# Patient Record
Sex: Male | Born: 1995 | Hispanic: Yes | Marital: Single | Smoking: Never smoker
Health system: Southern US, Community
[De-identification: ages and names within clinical notes are randomized; demographics above are authoritative.]

## PROBLEM LIST (undated history)

## (undated) DIAGNOSIS — Z789 Other specified health status: Secondary | ICD-10-CM

---

## 2016-03-15 ENCOUNTER — Emergency Department (HOSPITAL_COMMUNITY): Payer: Medicaid Other

## 2016-03-15 ENCOUNTER — Inpatient Hospital Stay (HOSPITAL_COMMUNITY): Payer: Medicaid Other

## 2016-03-15 ENCOUNTER — Encounter (HOSPITAL_COMMUNITY): Payer: Self-pay

## 2016-03-15 ENCOUNTER — Inpatient Hospital Stay (HOSPITAL_COMMUNITY)
Admission: EM | Admit: 2016-03-15 | Discharge: 2016-04-19 | DRG: 003 | Disposition: A | Discharge status: Rehabilitation Facility | Payer: Medicaid Other | Attending: Surgery | Admitting: Surgery

## 2016-03-15 DIAGNOSIS — J9690 Respiratory failure, unspecified, unspecified whether with hypoxia or hypercapnia: Secondary | ICD-10-CM | POA: Diagnosis not present

## 2016-03-15 DIAGNOSIS — J90 Pleural effusion, not elsewhere classified: Secondary | ICD-10-CM

## 2016-03-15 DIAGNOSIS — S12601A Unspecified nondisplaced fracture of seventh cervical vertebra, initial encounter for closed fracture: Secondary | ICD-10-CM | POA: Diagnosis present

## 2016-03-15 DIAGNOSIS — S0232XA Fracture of orbital floor, left side, initial encounter for closed fracture: Secondary | ICD-10-CM | POA: Diagnosis present

## 2016-03-15 DIAGNOSIS — R402112 Coma scale, eyes open, never, at arrival to emergency department: Secondary | ICD-10-CM | POA: Diagnosis present

## 2016-03-15 DIAGNOSIS — J9811 Atelectasis: Secondary | ICD-10-CM

## 2016-03-15 DIAGNOSIS — J15211 Pneumonia due to Methicillin susceptible Staphylococcus aureus: Secondary | ICD-10-CM | POA: Diagnosis not present

## 2016-03-15 DIAGNOSIS — J9382 Other air leak: Secondary | ICD-10-CM | POA: Diagnosis not present

## 2016-03-15 DIAGNOSIS — F4323 Adjustment disorder with mixed anxiety and depressed mood: Secondary | ICD-10-CM | POA: Diagnosis not present

## 2016-03-15 DIAGNOSIS — K592 Neurogenic bowel, not elsewhere classified: Secondary | ICD-10-CM | POA: Diagnosis not present

## 2016-03-15 DIAGNOSIS — L8990 Pressure ulcer of unspecified site, unspecified stage: Secondary | ICD-10-CM | POA: Insufficient documentation

## 2016-03-15 DIAGNOSIS — S42025S Nondisplaced fracture of shaft of left clavicle, sequela: Secondary | ICD-10-CM | POA: Diagnosis not present

## 2016-03-15 DIAGNOSIS — G8220 Paraplegia, unspecified: Secondary | ICD-10-CM | POA: Diagnosis present

## 2016-03-15 DIAGNOSIS — S42022A Displaced fracture of shaft of left clavicle, initial encounter for closed fracture: Secondary | ICD-10-CM | POA: Diagnosis present

## 2016-03-15 DIAGNOSIS — E872 Acidosis: Secondary | ICD-10-CM | POA: Diagnosis not present

## 2016-03-15 DIAGNOSIS — S22069A Unspecified fracture of T7-T8 vertebra, initial encounter for closed fracture: Secondary | ICD-10-CM | POA: Diagnosis present

## 2016-03-15 DIAGNOSIS — J15212 Pneumonia due to Methicillin resistant Staphylococcus aureus: Secondary | ICD-10-CM

## 2016-03-15 DIAGNOSIS — R402342 Coma scale, best motor response, flexion withdrawal, at arrival to emergency department: Secondary | ICD-10-CM | POA: Diagnosis present

## 2016-03-15 DIAGNOSIS — M7989 Other specified soft tissue disorders: Secondary | ICD-10-CM | POA: Diagnosis not present

## 2016-03-15 DIAGNOSIS — S24109D Unspecified injury at unspecified level of thoracic spinal cord, subsequent encounter: Secondary | ICD-10-CM | POA: Diagnosis not present

## 2016-03-15 DIAGNOSIS — J939 Pneumothorax, unspecified: Secondary | ICD-10-CM

## 2016-03-15 DIAGNOSIS — A411 Sepsis due to other specified staphylococcus: Secondary | ICD-10-CM | POA: Diagnosis not present

## 2016-03-15 DIAGNOSIS — S24109A Unspecified injury at unspecified level of thoracic spinal cord, initial encounter: Secondary | ICD-10-CM

## 2016-03-15 DIAGNOSIS — S0219XA Other fracture of base of skull, initial encounter for closed fracture: Secondary | ICD-10-CM | POA: Diagnosis present

## 2016-03-15 DIAGNOSIS — S24103A Unspecified injury at T7-T10 level of thoracic spinal cord, initial encounter: Secondary | ICD-10-CM | POA: Diagnosis present

## 2016-03-15 DIAGNOSIS — S27322A Contusion of lung, bilateral, initial encounter: Secondary | ICD-10-CM | POA: Diagnosis present

## 2016-03-15 DIAGNOSIS — S069X9A Unspecified intracranial injury with loss of consciousness of unspecified duration, initial encounter: Secondary | ICD-10-CM

## 2016-03-15 DIAGNOSIS — S299XXA Unspecified injury of thorax, initial encounter: Secondary | ICD-10-CM | POA: Diagnosis not present

## 2016-03-15 DIAGNOSIS — R402222 Coma scale, best verbal response, incomprehensible words, at arrival to emergency department: Secondary | ICD-10-CM | POA: Diagnosis present

## 2016-03-15 DIAGNOSIS — S272XXA Traumatic hemopneumothorax, initial encounter: Secondary | ICD-10-CM

## 2016-03-15 DIAGNOSIS — Z9689 Presence of other specified functional implants: Secondary | ICD-10-CM

## 2016-03-15 DIAGNOSIS — Y92410 Unspecified street and highway as the place of occurrence of the external cause: Secondary | ICD-10-CM

## 2016-03-15 DIAGNOSIS — J80 Acute respiratory distress syndrome: Secondary | ICD-10-CM | POA: Diagnosis present

## 2016-03-15 DIAGNOSIS — S2221XA Fracture of manubrium, initial encounter for closed fracture: Secondary | ICD-10-CM | POA: Diagnosis present

## 2016-03-15 DIAGNOSIS — S12600A Unspecified displaced fracture of seventh cervical vertebra, initial encounter for closed fracture: Secondary | ICD-10-CM | POA: Diagnosis present

## 2016-03-15 DIAGNOSIS — S069X2S Unspecified intracranial injury with loss of consciousness of 31 minutes to 59 minutes, sequela: Secondary | ICD-10-CM | POA: Diagnosis not present

## 2016-03-15 DIAGNOSIS — J9601 Acute respiratory failure with hypoxia: Secondary | ICD-10-CM | POA: Diagnosis not present

## 2016-03-15 DIAGNOSIS — Z4682 Encounter for fitting and adjustment of non-vascular catheter: Secondary | ICD-10-CM | POA: Diagnosis not present

## 2016-03-15 DIAGNOSIS — J9600 Acute respiratory failure, unspecified whether with hypoxia or hypercapnia: Secondary | ICD-10-CM

## 2016-03-15 DIAGNOSIS — R0989 Other specified symptoms and signs involving the circulatory and respiratory systems: Secondary | ICD-10-CM

## 2016-03-15 DIAGNOSIS — I959 Hypotension, unspecified: Secondary | ICD-10-CM | POA: Diagnosis present

## 2016-03-15 DIAGNOSIS — S0181XA Laceration without foreign body of other part of head, initial encounter: Secondary | ICD-10-CM | POA: Diagnosis present

## 2016-03-15 DIAGNOSIS — S24109S Unspecified injury at unspecified level of thoracic spinal cord, sequela: Secondary | ICD-10-CM | POA: Diagnosis not present

## 2016-03-15 DIAGNOSIS — Z419 Encounter for procedure for purposes other than remedying health state, unspecified: Secondary | ICD-10-CM

## 2016-03-15 DIAGNOSIS — S069X3S Unspecified intracranial injury with loss of consciousness of 1 hour to 5 hours 59 minutes, sequela: Secondary | ICD-10-CM | POA: Diagnosis not present

## 2016-03-15 DIAGNOSIS — Z930 Tracheostomy status: Secondary | ICD-10-CM

## 2016-03-15 DIAGNOSIS — E8770 Fluid overload, unspecified: Secondary | ICD-10-CM | POA: Diagnosis present

## 2016-03-15 DIAGNOSIS — N319 Neuromuscular dysfunction of bladder, unspecified: Secondary | ICD-10-CM | POA: Diagnosis not present

## 2016-03-15 DIAGNOSIS — D62 Acute posthemorrhagic anemia: Secondary | ICD-10-CM | POA: Diagnosis present

## 2016-03-15 DIAGNOSIS — B9562 Methicillin resistant Staphylococcus aureus infection as the cause of diseases classified elsewhere: Secondary | ICD-10-CM

## 2016-03-15 DIAGNOSIS — T797XXA Traumatic subcutaneous emphysema, initial encounter: Secondary | ICD-10-CM | POA: Diagnosis present

## 2016-03-15 DIAGNOSIS — Z931 Gastrostomy status: Secondary | ICD-10-CM

## 2016-03-15 DIAGNOSIS — T1490XA Injury, unspecified, initial encounter: Secondary | ICD-10-CM | POA: Diagnosis present

## 2016-03-15 DIAGNOSIS — R52 Pain, unspecified: Secondary | ICD-10-CM | POA: Diagnosis not present

## 2016-03-15 DIAGNOSIS — R000 Tachycardia, unspecified: Secondary | ICD-10-CM

## 2016-03-15 DIAGNOSIS — N179 Acute kidney failure, unspecified: Secondary | ICD-10-CM | POA: Diagnosis present

## 2016-03-15 DIAGNOSIS — S01419A Laceration without foreign body of unspecified cheek and temporomandibular area, initial encounter: Secondary | ICD-10-CM | POA: Diagnosis present

## 2016-03-15 DIAGNOSIS — T148XXA Other injury of unspecified body region, initial encounter: Secondary | ICD-10-CM | POA: Diagnosis not present

## 2016-03-15 DIAGNOSIS — D72829 Elevated white blood cell count, unspecified: Secondary | ICD-10-CM | POA: Diagnosis not present

## 2016-03-15 DIAGNOSIS — R7881 Bacteremia: Secondary | ICD-10-CM

## 2016-03-15 DIAGNOSIS — S0993XA Unspecified injury of face, initial encounter: Secondary | ICD-10-CM

## 2016-03-15 DIAGNOSIS — Z9889 Other specified postprocedural states: Secondary | ICD-10-CM

## 2016-03-15 DIAGNOSIS — Z452 Encounter for adjustment and management of vascular access device: Secondary | ICD-10-CM

## 2016-03-15 DIAGNOSIS — S42022S Displaced fracture of shaft of left clavicle, sequela: Secondary | ICD-10-CM | POA: Diagnosis not present

## 2016-03-15 DIAGNOSIS — J942 Hemothorax: Secondary | ICD-10-CM

## 2016-03-15 DIAGNOSIS — G8918 Other acute postprocedural pain: Secondary | ICD-10-CM | POA: Diagnosis not present

## 2016-03-15 DIAGNOSIS — F1210 Cannabis abuse, uncomplicated: Secondary | ICD-10-CM

## 2016-03-15 DIAGNOSIS — R0682 Tachypnea, not elsewhere classified: Secondary | ICD-10-CM

## 2016-03-15 DIAGNOSIS — S069X0D Unspecified intracranial injury without loss of consciousness, subsequent encounter: Secondary | ICD-10-CM | POA: Diagnosis not present

## 2016-03-15 DIAGNOSIS — R1312 Dysphagia, oropharyngeal phase: Secondary | ICD-10-CM | POA: Diagnosis not present

## 2016-03-15 HISTORY — DX: Other specified health status: Z78.9

## 2016-03-15 LAB — COMPREHENSIVE METABOLIC PANEL
ALBUMIN: 4.1 g/dL (ref 3.5–5.0)
ALT: 109 U/L — AB (ref 17–63)
AST: 134 U/L — AB (ref 15–41)
Alkaline Phosphatase: 98 U/L (ref 38–126)
Anion gap: 21 — ABNORMAL HIGH (ref 5–15)
BILIRUBIN TOTAL: 0.6 mg/dL (ref 0.3–1.2)
BUN: 14 mg/dL (ref 6–20)
CO2: 14 mmol/L — ABNORMAL LOW (ref 22–32)
CREATININE: 1.5 mg/dL — AB (ref 0.61–1.24)
Calcium: 9 mg/dL (ref 8.9–10.3)
Chloride: 105 mmol/L (ref 101–111)
GFR calc Af Amer: >60 mL/min (ref >60)
GLUCOSE: 282 mg/dL — AB (ref 65–99)
Potassium: 3.2 mmol/L — ABNORMAL LOW (ref 3.5–5.1)
Sodium: 140 mmol/L (ref 135–145)
TOTAL PROTEIN: 6.4 g/dL — AB (ref 6.5–8.1)

## 2016-03-15 LAB — CBC WITH DIFFERENTIAL/PLATELET
Basophils Absolute: 0 10*3/uL (ref 0.0–0.1)
Basophils Relative: 0 %
EOS PCT: 0 %
Eosinophils Absolute: 0 10*3/uL (ref 0.0–0.7)
HEMATOCRIT: 46.5 % (ref 39.0–52.0)
HEMOGLOBIN: 15.8 g/dL (ref 13.0–17.0)
LYMPHS PCT: 41 %
Lymphs Abs: 11.8 10*3/uL — ABNORMAL HIGH (ref 0.7–4.0)
MCH: 31.5 pg (ref 26.0–34.0)
MCHC: 34 g/dL (ref 30.0–36.0)
MCV: 92.8 fL (ref 78.0–100.0)
MONOS PCT: 5 %
Monocytes Absolute: 1.4 10*3/uL — ABNORMAL HIGH (ref 0.1–1.0)
NEUTROS PCT: 54 %
Neutro Abs: 15.7 10*3/uL — ABNORMAL HIGH (ref 1.7–7.7)
Platelets: 282 10*3/uL (ref 150–400)
RBC: 5.01 MIL/uL (ref 4.22–5.81)
RDW: 12.5 % (ref 11.5–15.5)
WBC: 28.9 10*3/uL — AB (ref 4.0–10.5)

## 2016-03-15 LAB — URINALYSIS, ROUTINE W REFLEX MICROSCOPIC
BILIRUBIN URINE: NEGATIVE
Glucose, UA: 50 mg/dL — AB
Ketones, ur: NEGATIVE mg/dL
LEUKOCYTES UA: NEGATIVE
NITRITE: NEGATIVE
PH: 7 (ref 5.0–8.0)
Protein, ur: 100 mg/dL — AB
SPECIFIC GRAVITY, URINE: 1.027 (ref 1.005–1.030)
SQUAMOUS EPITHELIAL / LPF: NONE SEEN

## 2016-03-15 LAB — RAPID URINE DRUG SCREEN, HOSP PERFORMED
AMPHETAMINES: NOT DETECTED
BARBITURATES: NOT DETECTED
Benzodiazepines: POSITIVE — AB
COCAINE: NOT DETECTED
Opiates: NOT DETECTED
TETRAHYDROCANNABINOL: POSITIVE — AB

## 2016-03-15 LAB — I-STAT ARTERIAL BLOOD GAS, ED
ACID-BASE DEFICIT: 10 mmol/L — AB (ref 0.0–2.0)
Bicarbonate: 20.5 mmol/L (ref 20.0–28.0)
O2 Saturation: 87 %
PCO2 ART: 67 mmHg — AB (ref 32.0–48.0)
PH ART: 7.094 — AB (ref 7.350–7.450)
PO2 ART: 74 mmHg — AB (ref 83.0–108.0)
TCO2: 23 mmol/L (ref 0–100)

## 2016-03-15 LAB — TRIGLYCERIDES: Triglycerides: 125 mg/dL (ref <150)

## 2016-03-15 LAB — I-STAT CG4 LACTIC ACID, ED
LACTIC ACID, VENOUS: 13.76 mmol/L — AB (ref 0.5–1.9)
Lactic Acid, Venous: 4.43 mmol/L (ref 0.5–1.9)

## 2016-03-15 LAB — ETHANOL: Alcohol, Ethyl (B): <5 mg/dL (ref <5)

## 2016-03-15 LAB — ABO/RH: ABO/RH(D): O POS

## 2016-03-15 LAB — PREPARE RBC (CROSSMATCH)

## 2016-03-15 MED ORDER — PANTOPRAZOLE SODIUM 40 MG PO TBEC: 40.0000 mg | DELAYED_RELEASE_TABLET | Freq: Every day | ORAL | Status: DC

## 2016-03-15 MED ORDER — DEXTROSE-NACL 5-0.9 % IV SOLN
INTRAVENOUS | Status: DC
  Administered 2016-03-16 – 2016-03-22 (×7): via INTRAVENOUS
  Filled 2016-03-15: qty 1000

## 2016-03-15 MED ORDER — MIDAZOLAM HCL 2 MG/2ML IJ SOLN
2.0000 mg | INTRAMUSCULAR | Status: AC | PRN
  Administered 2016-03-23 (×3): 2 mg via INTRAVENOUS
  Filled 2016-03-15 (×3): qty 2

## 2016-03-15 MED ORDER — PHENYLEPHRINE HCL 10 MG/ML IJ SOLN
INTRAMUSCULAR | Status: AC | PRN
  Administered 2016-03-15: 20 ug via INTRAVENOUS

## 2016-03-15 MED ORDER — FENTANYL BOLUS VIA INFUSION
50.0000 ug | INTRAVENOUS | Status: DC | PRN
  Administered 2016-03-16 (×2): 50 ug via INTRAVENOUS
  Administered 2016-03-17: 25 ug via INTRAVENOUS
  Administered 2016-03-17 – 2016-03-22 (×6): 50 ug via INTRAVENOUS
  Administered 2016-03-22: 100 ug via INTRAVENOUS
  Administered 2016-03-23 – 2016-04-01 (×13): 50 ug via INTRAVENOUS
  Filled 2016-03-15 (×3): qty 50

## 2016-03-15 MED ORDER — ETOMIDATE 2 MG/ML IV SOLN
INTRAVENOUS | Status: DC | PRN
  Administered 2016-03-15: 30 mg via INTRAVENOUS

## 2016-03-15 MED ORDER — SODIUM CHLORIDE 0.9 % IV SOLN
25.0000 ug/h | INTRAVENOUS | Status: DC
  Administered 2016-03-16: 100 ug/h via INTRAVENOUS
  Administered 2016-03-16: 50 ug/h via INTRAVENOUS
  Administered 2016-03-17: 100 ug/h via INTRAVENOUS
  Administered 2016-03-18 – 2016-03-19 (×2): 150 ug/h via INTRAVENOUS
  Administered 2016-03-19: 200 ug/h via INTRAVENOUS
  Administered 2016-03-20: 300 ug/h via INTRAVENOUS
  Administered 2016-03-20: 250 ug/h via INTRAVENOUS
  Administered 2016-03-20: 200 ug/h via INTRAVENOUS
  Administered 2016-03-21: 400 ug/h via INTRAVENOUS
  Administered 2016-03-21: 300 ug/h via INTRAVENOUS
  Administered 2016-03-21: 350 ug/h via INTRAVENOUS
  Administered 2016-03-22 (×2): 325 ug/h via INTRAVENOUS
  Administered 2016-03-23 – 2016-03-25 (×11): 400 ug/h via INTRAVENOUS
  Administered 2016-03-25: 300 ug/h via INTRAVENOUS
  Administered 2016-03-26: 400 ug/h via INTRAVENOUS
  Filled 2016-03-15 (×28): qty 50

## 2016-03-15 MED ORDER — SODIUM CHLORIDE 0.9 % IV SOLN
INTRAVENOUS | Status: DC | PRN
  Administered 2016-03-15 (×3): 1000 mL via INTRAVENOUS

## 2016-03-15 MED ORDER — KETAMINE HCL-SODIUM CHLORIDE 100-0.9 MG/10ML-% IV SOSY
PREFILLED_SYRINGE | INTRAVENOUS | Status: AC
  Administered 2016-03-15: 100 mg
  Filled 2016-03-15: qty 10

## 2016-03-15 MED ORDER — HYDROMORPHONE HCL 2 MG/ML IJ SOLN: 1.0000 mg | INTRAMUSCULAR | Status: DC | PRN

## 2016-03-15 MED ORDER — PANTOPRAZOLE SODIUM 40 MG IV SOLR
40.0000 mg | Freq: Every day | INTRAVENOUS | Status: DC
  Administered 2016-03-16 – 2016-03-21 (×6): 40 mg via INTRAVENOUS
  Filled 2016-03-15 (×6): qty 40

## 2016-03-15 MED ORDER — ONDANSETRON HCL 4 MG PO TABS: 4.0000 mg | ORAL_TABLET | Freq: Four times a day (QID) | ORAL | Status: DC | PRN

## 2016-03-15 MED ORDER — FENTANYL CITRATE (PF) 100 MCG/2ML IJ SOLN: 50.0000 ug | Freq: Once | INTRAMUSCULAR | Status: DC

## 2016-03-15 MED ORDER — IOPAMIDOL (ISOVUE-300) INJECTION 61%
INTRAVENOUS | Status: AC
  Administered 2016-03-15: 100 mL
  Filled 2016-03-15: qty 100

## 2016-03-15 MED ORDER — SODIUM CHLORIDE 0.9 % IV SOLN
0.5000 mg/kg/h | INTRAVENOUS | Status: DC
  Administered 2016-03-15: 0.5 mg/kg/h via INTRAVENOUS
  Filled 2016-03-15: qty 2

## 2016-03-15 MED ORDER — PHENYLEPHRINE HCL 10 MG/ML IJ SOLN
0.0000 ug/min | INTRAVENOUS | Status: DC
  Administered 2016-03-15: 100 ug/min via INTRAVENOUS
  Administered 2016-03-16: 200 ug/min via INTRAVENOUS
  Filled 2016-03-15 (×3): qty 1

## 2016-03-15 MED ORDER — PROPOFOL 1000 MG/100ML IV EMUL
5.0000 ug/kg/min | INTRAVENOUS | Status: DC
Start: 2016-03-15 — End: 2016-03-15
  Administered 2016-03-16: 50 ug/kg/min via INTRAVENOUS

## 2016-03-15 MED ORDER — ONDANSETRON HCL 4 MG/2ML IJ SOLN: 4.0000 mg | Freq: Four times a day (QID) | INTRAMUSCULAR | Status: DC | PRN

## 2016-03-15 MED ORDER — MIDAZOLAM HCL 5 MG/5ML IJ SOLN
INTRAMUSCULAR | Status: DC | PRN
  Administered 2016-03-15: 2 mg via INTRAVENOUS

## 2016-03-15 MED ORDER — ORAL CARE MOUTH RINSE
15.0000 mL | Freq: Four times a day (QID) | OROMUCOSAL | Status: DC
  Administered 2016-03-16 (×3): 15 mL via OROMUCOSAL

## 2016-03-15 MED ORDER — PROPOFOL 500 MG/50ML IV EMUL
INTRAVENOUS | Status: DC | PRN
  Administered 2016-03-15: 20 mg/kg/h via INTRAVENOUS
  Administered 2016-03-15: 20 ug/kg/min via INTRAVENOUS

## 2016-03-15 MED ORDER — LORAZEPAM 2 MG/ML IJ SOLN
INTRAMUSCULAR | Status: AC
  Filled 2016-03-15: qty 1

## 2016-03-15 MED ORDER — SODIUM CHLORIDE 0.9 % IV SOLN
INTRAVENOUS | Status: AC | PRN
  Administered 2016-03-15 (×2): 1000 mL via INTRAVENOUS

## 2016-03-15 MED ORDER — ROCURONIUM BROMIDE 50 MG/5ML IV SOLN
INTRAVENOUS | Status: DC | PRN
  Administered 2016-03-15: 50 mg via INTRAVENOUS

## 2016-03-15 MED ORDER — MIDAZOLAM HCL 2 MG/2ML IJ SOLN
INTRAMUSCULAR | Status: AC
  Filled 2016-03-15: qty 4

## 2016-03-15 MED ORDER — SUCCINYLCHOLINE CHLORIDE 20 MG/ML IJ SOLN
INTRAMUSCULAR | Status: DC | PRN
  Administered 2016-03-15: 90 mg via INTRAVENOUS

## 2016-03-15 MED ORDER — CHLORHEXIDINE GLUCONATE 0.12% ORAL RINSE (MEDLINE KIT)
15.0000 mL | Freq: Two times a day (BID) | OROMUCOSAL | Status: DC
  Administered 2016-03-16 (×2): 15 mL via OROMUCOSAL

## 2016-03-15 MED ORDER — MIDAZOLAM HCL 2 MG/2ML IJ SOLN
2.0000 mg | INTRAMUSCULAR | Status: DC | PRN
  Administered 2016-03-22 – 2016-03-23 (×3): 2 mg via INTRAVENOUS
  Filled 2016-03-15 (×3): qty 2

## 2016-03-15 MED ORDER — PROPOFOL 1000 MG/100ML IV EMUL
INTRAVENOUS | Status: AC
  Administered 2016-03-15: 20 mg/kg/h via INTRAVENOUS
  Filled 2016-03-15: qty 100

## 2016-03-15 MED ORDER — SODIUM CHLORIDE 0.9 % IV SOLN: Freq: Once | INTRAVENOUS | Status: DC

## 2016-03-15 NOTE — ED Notes (Signed)
ER MD at bedside for chest tube insertion on the L side

## 2016-03-15 NOTE — ED Notes (Signed)
Off to CT

## 2016-03-15 NOTE — ED Notes (Signed)
Neo Drip increased to 200

## 2016-03-15 NOTE — ED Notes (Signed)
Labored breathing noted

## 2016-03-15 NOTE — ED Notes (Signed)
+   pedal pulses felt pt has laceration noted to R facial area

## 2016-03-15 NOTE — ED Notes (Signed)
Chest tube inserted on the R chest

## 2016-03-15 NOTE — ED Notes (Signed)
[facility]

## 2016-03-15 NOTE — Procedures (Signed)
Central Venous Catheter Insertion Procedure Note Luis [name] [id number] 29-Feb-1996  Procedure: Insertion of Central Venous Catheter Indications: Drug and/or fluid administration  Procedure Details Consent: Unable to obtain consent because of emergent medical necessity. Time Out: Verified patient identification, verified procedure, site/side was marked, verified correct patient position, special equipment/implants available, medications/allergies/relevent history reviewed, required imaging and test results available.  Performed  Maximum sterile technique was used including antiseptics, gloves, hand hygiene and sheet. Skin prep: Iodine solution; local anesthetic administered A antimicrobial bonded/coated triple lumen catheter was placed in the right femoral vein due to emergent situation using the Seldinger technique.  Evaluation Blood flow good Complications: No apparent complications Patient did tolerate procedure well. Chest X-ray ordered to verify placement.  CXR: not done due to femoral placement .  Luis [name] A. 03/15/2016, 11:02 PM

## 2016-03-15 NOTE — ED Notes (Signed)
Large bruise noted to mid thoracic area

## 2016-03-15 NOTE — ED Notes (Signed)
Neo push going in now

## 2016-03-15 NOTE — ED Notes (Signed)
ENT at bedside for facial laceration repair Trauma Surgeon at bedside for 3rd chest tube insertion on the R side
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
## 2016-03-15 NOTE — ED Notes (Signed)
1st unit Blood finished
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
## 2016-03-15 NOTE — ED Notes (Signed)
Second bag IVF wide open
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
## 2016-03-15 NOTE — ED Notes (Signed)
Pt intubated 7.5 ETT 20 cm at the lip + color change + breath sounds heard

## 2016-03-15 NOTE — ED Notes (Signed)
Trauma MD remains at bedside Chest tubes hooked up to suction
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
## 2016-03-15 NOTE — ED Notes (Signed)
Pt continues to pull upper extremities increased Propofol to 40

## 2016-03-15 NOTE — ED Notes (Signed)
2nd Unit blood complete

## 2016-03-15 NOTE — Procedures (Signed)
Chest Tube Insertion Procedure Note  Indications:  Clinically significant Pneumothorax bilateral  Pre-operative Diagnosis: Pneumothorax bilateral  Post-operative Diagnosis: Pneumothorax  bilateral  Procedure Details  Emergency consent obtained due to patient's critical condition..  Risks of lung perforation, hemorrhage, arrhythmia, and adverse drug reaction were discussed.   After sterile skin prep, using standard technique, a 20 French tube was placed in the right anterior 5 rib space A similar 20 French chest tube was placed on the left side anterior fifth rib space above previously placed chest tubes. Both tubes had a return of blood and air. There secured to skin with 2-0 silk..  Findings: None  Estimated Blood Loss:  less than 50 mL         Specimens:  None              Complications:  None; patient tolerated the procedure well.         Disposition: ICU - intubated and critically ill.         Condition: stable  Attending Attestation: I performed the procedure.

## 2016-03-15 NOTE — ED Notes (Addendum)
L upper needle decompression 16G placed by EMS for L chest

## 2016-03-15 NOTE — ED Notes (Signed)
Pt back from CT ER MD to bedside to evaluate L chest tube as no bubbles are noted in the chamber chest tube is hooked up to suction appropriately pt remains on monitor no change in condition

## 2016-03-15 NOTE — ED Notes (Signed)
Pt continues to move his R arm propofol increased to 60 mcg/kg/
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
## 2016-03-15 NOTE — Procedures (Signed)
Preop diagnosis: Left facial lacerations Postop diagnosis: same Procedure: Moderate complexity closure of left facial lacerations totaling 16 cm Surgeon: Jenne PaneBates Anesth: Local with 2% lidocaine with 1:200,000 epinephrine Comp: None Findings:  There is a medial brow laceration that is vertically oriented and extends through muscle but not down to periosteum.  There is a medial cheek laceration fully through skin with a laterally pedicled skin flap.  Likewise, there is an upper lateral cheek laceration with a laterally pedicled skin flap.  Finally, there is a lower cheek laceration that extends superior laterally that is through skin. Description:  The patient was identified in the ER.  The patient was unable to give consent due to being intubated and there was no family available at the time.  The left face was prepped with Betadyne and draped.  The wounds were injected with local anesthetic and then copiously irrigated with saline, removing clot and foreign material.  The wounds were then each closed in the subcutaneous tissue using 4-0 Vicryl in a simple, interrupted fashion.  The skin level was then closed with 5-0 Prolene in a simple, running and simple, interrupted fashion.  The wounds were covered with Bactracin and he was returned to nursing care.  Throughout the procedure, the patient's sedation level and low blood pressure were managed by the Trauma and ER staff.

## 2016-03-15 NOTE — ED Notes (Signed)
Trauma surgeon notified blood noted to be pouring out around L lower chest tube
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
## 2016-03-15 NOTE — ED Notes (Signed)
Chest tube inserted on the L side

## 2016-03-15 NOTE — ED Notes (Signed)
Propofol increased to 40mcg/kg
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
## 2016-03-15 NOTE — ED Notes (Signed)
Pt continues to pull and use upper extremities propofol at 50
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
## 2016-03-15 NOTE — ED Notes (Signed)
Pt continues to pull and use upper extremities RN and CNA helping to prevent pt from pulling at lines increased propofol to 60

## 2016-03-15 NOTE — ED Notes (Signed)
[facility], pt continues to move upper extremities page put out to Trauma Surgeon for additional sedation orders

## 2016-03-15 NOTE — ED Notes (Signed)
Pt moving bilateral upper extremities Trauma Surgeon aware continuing to hold on propofol as pressures are 60's systolic

## 2016-03-15 NOTE — ED Notes (Signed)
Propofol increased to 30mcg/kg

## 2016-03-15 NOTE — Progress Notes (Signed)
   03/15/16 2240  Clinical Encounter Type  Visited With Family  Visit Type ED;Follow-up  Spiritual Encounters  Spiritual Needs Emotional  Stress Factors  Family Stress Factors Family relationships  Family in Pod A as other areas full. Physician came in to update family. Pt to go to MRI then to 7830m. Escorted family to third floor waiting room.

## 2016-03-15 NOTE — ED Notes (Signed)
Propofol increased to 7650mcg/kg/hr due to pt. Moving R arm

## 2016-03-15 NOTE — ED Notes (Signed)
Pt continues to move upper extremities Bolus 20 mg of Propofol given followed by infusion at 4220mcg/kg

## 2016-03-15 NOTE — ED Notes (Signed)
Dr Hyacinth MeekerMiller spoke with pt's mother instructed her to come in

## 2016-03-15 NOTE — ED Notes (Signed)
Trauma Surgeon to bedside bilateral lower chest tubes removed from pt.

## 2016-03-15 NOTE — ED Triage Notes (Signed)
Rollover with + ejection

## 2016-03-15 NOTE — ED Notes (Addendum)
Trauma Surgeon at bedside L  Chest tube inserted and hooked up to intermittent suction

## 2016-03-16 ENCOUNTER — Inpatient Hospital Stay (HOSPITAL_COMMUNITY): Payer: Medicaid Other

## 2016-03-16 ENCOUNTER — Other Ambulatory Visit (HOSPITAL_COMMUNITY): Payer: Self-pay

## 2016-03-16 LAB — PREPARE FRESH FROZEN PLASMA
BLOOD PRODUCT EXPIRATION DATE: 201712222359
Blood Product Expiration Date: 201712222359
ISSUE DATE / TIME: 201712191935
ISSUE DATE / TIME: 201712191935
Unit Type and Rh: 6200
Unit Type and Rh: 6200

## 2016-03-16 LAB — POCT I-STAT 3, ART BLOOD GAS (G3+)
Acid-base deficit: 5 mmol/L — ABNORMAL HIGH (ref 0.0–2.0)
BICARBONATE: 19.1 mmol/L — AB (ref 20.0–28.0)
O2 SAT: 99 %
TCO2: 20 mmol/L (ref 0–100)
pCO2 arterial: 31.5 mmHg — ABNORMAL LOW (ref 32.0–48.0)
pH, Arterial: 7.391 (ref 7.350–7.450)
pO2, Arterial: 159 mmHg — ABNORMAL HIGH (ref 83.0–108.0)

## 2016-03-16 LAB — COMPREHENSIVE METABOLIC PANEL
ALK PHOS: 52 U/L (ref 38–126)
ALT: 71 U/L — AB (ref 17–63)
AST: 136 U/L — AB (ref 15–41)
Albumin: 2.2 g/dL — ABNORMAL LOW (ref 3.5–5.0)
Anion gap: 3 — ABNORMAL LOW (ref 5–15)
BILIRUBIN TOTAL: 1 mg/dL (ref 0.3–1.2)
BUN: 11 mg/dL (ref 6–20)
CALCIUM: 6.8 mg/dL — AB (ref 8.9–10.3)
CHLORIDE: 116 mmol/L — AB (ref 101–111)
CO2: 20 mmol/L — ABNORMAL LOW (ref 22–32)
CREATININE: 1.08 mg/dL (ref 0.61–1.24)
Glucose, Bld: 186 mg/dL — ABNORMAL HIGH (ref 65–99)
Potassium: 4.6 mmol/L (ref 3.5–5.1)
Sodium: 139 mmol/L (ref 135–145)
TOTAL PROTEIN: 3.5 g/dL — AB (ref 6.5–8.1)

## 2016-03-16 LAB — CBC
HEMATOCRIT: 30 % — AB (ref 39.0–52.0)
HEMOGLOBIN: 10.5 g/dL — AB (ref 13.0–17.0)
MCH: 31.1 pg (ref 26.0–34.0)
MCHC: 35 g/dL (ref 30.0–36.0)
MCV: 88.8 fL (ref 78.0–100.0)
Platelets: 170 10*3/uL (ref 150–400)
RBC: 3.38 MIL/uL — AB (ref 4.22–5.81)
RDW: 14.1 % (ref 11.5–15.5)
WBC: 10.9 10*3/uL — AB (ref 4.0–10.5)

## 2016-03-16 LAB — HEMOGLOBIN AND HEMATOCRIT, BLOOD
HCT: 26.3 % — ABNORMAL LOW (ref 39.0–52.0)
Hemoglobin: 9.3 g/dL — ABNORMAL LOW (ref 13.0–17.0)

## 2016-03-16 LAB — BLOOD PRODUCT ORDER (VERBAL) VERIFICATION

## 2016-03-16 LAB — PREPARE RBC (CROSSMATCH)

## 2016-03-16 LAB — LACTIC ACID, PLASMA: Lactic Acid, Venous: 2.1 mmol/L (ref 0.5–1.9)

## 2016-03-16 MED ORDER — PROPOFOL 1000 MG/100ML IV EMUL
INTRAVENOUS | Status: AC
Start: 2016-03-16 — End: 2016-03-16
  Filled 2016-03-16: qty 100

## 2016-03-16 MED ORDER — SODIUM CHLORIDE 0.9 % IV SOLN
Freq: Once | INTRAVENOUS | Status: AC
  Administered 2016-03-16: 06:00:00 via INTRAVENOUS

## 2016-03-16 MED ORDER — SODIUM CHLORIDE 0.9 % IV SOLN
Freq: Once | INTRAVENOUS | Status: AC
  Administered 2016-03-16: 03:00:00 via INTRAVENOUS

## 2016-03-16 MED ORDER — LIDOCAINE HCL (PF) 1 % IJ SOLN
INTRAMUSCULAR | Status: AC
  Administered 2016-03-16: 10 mL
  Filled 2016-03-16: qty 10

## 2016-03-16 MED ORDER — MIDAZOLAM HCL 5 MG/ML IJ SOLN
1.0000 mg/h | INTRAMUSCULAR | Status: DC
  Administered 2016-03-16 (×2): 2 mg/h via INTRAVENOUS
  Administered 2016-03-16: 3 mg/h via INTRAVENOUS
  Administered 2016-03-17 – 2016-03-19 (×3): 2 mg/h via INTRAVENOUS
  Administered 2016-03-20: 5 mg/h via INTRAVENOUS
  Filled 2016-03-16 (×7): qty 10

## 2016-03-16 MED ORDER — ORAL CARE MOUTH RINSE
15.0000 mL | OROMUCOSAL | Status: DC
  Administered 2016-03-16 – 2016-04-10 (×225): 15 mL via OROMUCOSAL

## 2016-03-16 MED ORDER — CHLORHEXIDINE GLUCONATE 0.12% ORAL RINSE (MEDLINE KIT)
15.0000 mL | Freq: Two times a day (BID) | OROMUCOSAL | Status: DC
  Administered 2016-03-16 – 2016-04-10 (×48): 15 mL via OROMUCOSAL

## 2016-03-16 MED ORDER — NOREPINEPHRINE BITARTRATE 1 MG/ML IV SOLN
0.0000 ug/min | INTRAVENOUS | Status: DC
  Administered 2016-03-16: 7 ug/min via INTRAVENOUS
  Filled 2016-03-16 (×2): qty 4

## 2016-03-16 MED ORDER — PHENYLEPHRINE HCL 10 MG/ML IJ SOLN
0.0000 ug/min | INTRAMUSCULAR | Status: DC
  Administered 2016-03-16: 175 ug/min via INTRAVENOUS
  Administered 2016-03-16: 50 ug/min via INTRAVENOUS
  Administered 2016-03-16: 200 ug/min via INTRAVENOUS
  Filled 2016-03-16 (×4): qty 4

## 2016-03-16 NOTE — Progress Notes (Signed)
RT note-Attempted to place Aline in L and R radial, good flow and unable to thread catheter. RN and MD aware.

## 2016-03-16 NOTE — Procedures (Signed)
Chest Tube Insertion Procedure Note  Indications:  Clinically significant Pneumothorax and continuous air leak with chest tube in place  Pre-operative Diagnosis: Left pneumothorax with continuoous air leak  Post-operative Diagnosis: Pneumothorax and and continuous air leak  Procedure Details  Informed consent was obtained for the procedure, including sedation.  Risks of lung perforation, hemorrhage, arrhythmia, and adverse drug reaction were discussed.   After sterile skin prep, using standard technique, a 28 French tube was placed in the left anterior 6th rib space.  Findings: None  Estimated Blood Loss:  Minimal         Specimens:  None              Complications:  None; patient tolerated the procedure well.         Disposition: ICU - intubated and critically ill.         Condition: stable  Attending Attestation: I performed the procedure.

## 2016-03-16 NOTE — Procedures (Signed)
Central Venous Catheter Insertion Procedure Note Luis [name] [id number] [birth year]  Procedure: Insertion of Central Venous Catheter Indications: Assessment of intravascular volume and Drug and/or fluid administration  Procedure Details Consent: Risks of procedure as well as the alternatives and risks of each were explained to the (patient/caregiver).  Consent for procedure obtained. Time Out: Verified patient identification, verified procedure, site/side was marked, verified correct patient position, special equipment/implants available, medications/allergies/relevent history reviewed, required imaging and test results available.  Performed  Maximum sterile technique was used including antiseptics, cap, gloves, gown, hand hygiene, mask and sheet. Skin prep: Chlorhexidine; local anesthetic administered A antimicrobial bonded/coated triple lumen catheter was placed in the right subclavian vein using the Seldinger technique.  Evaluation Blood flow good Complications: No apparent complications Patient did tolerate procedure well. Chest X-ray ordered to verify placement.  CXR: pending.  Luis [name] 03/16/2016, 6:10 PM

## 2016-03-16 NOTE — ED Notes (Signed)
[facility]  Waiting to go to MRI
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
## 2016-03-16 NOTE — Procedures (Signed)
Arterial Catheter Insertion Procedure Note Luis [name] [id number] March 12, 1996  Procedure: Insertion of Arterial Catheter  Indications: Blood pressure monitoring  Procedure Details Consent: Risks of procedure as well as the alternatives and risks of each were explained to the (patient/caregiver).  Consent for procedure obtained. Time Out: Verified patient identification, verified procedure, site/side was marked, verified correct patient position, special equipment/implants available, medications/allergies/relevent history reviewed, required imaging and test results available.  Performed  Maximum sterile technique was used including gloves and hand hygiene. Skin prep: Alcohol; local anesthetic administered 20 gauge catheter was inserted into right radial artery using the Seldinger technique.  Evaluation Blood flow good; BP tracing good. Complications: No apparent complications.   Luis [name] 03/16/2016

## 2016-03-17 ENCOUNTER — Inpatient Hospital Stay (HOSPITAL_COMMUNITY): Payer: Medicaid Other

## 2016-03-17 LAB — GLUCOSE, CAPILLARY
GLUCOSE-CAPILLARY: 107 mg/dL — AB (ref 65–99)
GLUCOSE-CAPILLARY: 113 mg/dL — AB (ref 65–99)
GLUCOSE-CAPILLARY: 144 mg/dL — AB (ref 65–99)

## 2016-03-17 LAB — BASIC METABOLIC PANEL
Anion gap: 4 — ABNORMAL LOW (ref 5–15)
BUN: 9 mg/dL (ref 6–20)
CALCIUM: 7.6 mg/dL — AB (ref 8.9–10.3)
CHLORIDE: 113 mmol/L — AB (ref 101–111)
CO2: 19 mmol/L — AB (ref 22–32)
CREATININE: 0.81 mg/dL (ref 0.61–1.24)
GFR calc Af Amer: >60 mL/min (ref >60)
GFR calc non Af Amer: >60 mL/min (ref >60)
GLUCOSE: 132 mg/dL — AB (ref 65–99)
Potassium: 3.5 mmol/L (ref 3.5–5.1)
Sodium: 136 mmol/L (ref 135–145)

## 2016-03-17 LAB — CBC WITH DIFFERENTIAL/PLATELET
Basophils Absolute: 0 10*3/uL (ref 0.0–0.1)
Basophils Relative: 0 %
Eosinophils Absolute: 0 10*3/uL (ref 0.0–0.7)
Eosinophils Relative: 0 %
HEMATOCRIT: 26.5 % — AB (ref 39.0–52.0)
HEMOGLOBIN: 9.5 g/dL — AB (ref 13.0–17.0)
LYMPHS ABS: 1.3 10*3/uL (ref 0.7–4.0)
LYMPHS PCT: 16 %
MCH: 30 pg (ref 26.0–34.0)
MCHC: 35.8 g/dL (ref 30.0–36.0)
MCV: 83.6 fL (ref 78.0–100.0)
MONO ABS: 0.7 10*3/uL (ref 0.1–1.0)
MONOS PCT: 8 %
NEUTROS ABS: 6.3 10*3/uL (ref 1.7–7.7)
NEUTROS PCT: 76 %
Platelets: 98 10*3/uL — ABNORMAL LOW (ref 150–400)
RBC: 3.17 MIL/uL — ABNORMAL LOW (ref 4.22–5.81)
RDW: 14 % (ref 11.5–15.5)
WBC: 8.3 10*3/uL (ref 4.0–10.5)

## 2016-03-17 LAB — PHOSPHORUS
PHOSPHORUS: 1.6 mg/dL — AB (ref 2.5–4.6)
Phosphorus: 1.4 mg/dL — ABNORMAL LOW (ref 2.5–4.6)

## 2016-03-17 LAB — MAGNESIUM
Magnesium: 1.6 mg/dL — ABNORMAL LOW (ref 1.7–2.4)
Magnesium: 1.7 mg/dL (ref 1.7–2.4)

## 2016-03-17 LAB — LACTIC ACID, PLASMA
LACTIC ACID, VENOUS: 1.2 mmol/L (ref 0.5–1.9)
LACTIC ACID, VENOUS: 0.9 mmol/L (ref 0.5–1.9)
Lactic Acid, Venous: 1.4 mmol/L (ref 0.5–1.9)

## 2016-03-17 MED ORDER — CLONAZEPAM 0.5 MG PO TABS
0.5000 mg | ORAL_TABLET | Freq: Two times a day (BID) | ORAL | Status: DC
  Administered 2016-03-17 – 2016-03-23 (×14): 0.5 mg
  Filled 2016-03-17 (×14): qty 1

## 2016-03-17 MED ORDER — ALBUMIN HUMAN 5 % IV SOLN
12.5000 g | Freq: Once | INTRAVENOUS | Status: AC
  Administered 2016-03-17: 12.5 g via INTRAVENOUS

## 2016-03-17 MED ORDER — ACETAMINOPHEN 160 MG/5ML PO SOLN
650.0000 mg | Freq: Four times a day (QID) | ORAL | Status: DC | PRN
  Administered 2016-03-17 – 2016-03-20 (×8): 650 mg
  Administered 2016-03-21: 14:00:00
  Administered 2016-03-22 – 2016-04-03 (×21): 650 mg
  Filled 2016-03-17 (×30): qty 20.3

## 2016-03-17 MED ORDER — CLONAZEPAM 0.1 MG/ML ORAL SUSPENSION: 0.5000 mg | Freq: Two times a day (BID) | ORAL | Status: DC

## 2016-03-17 MED ORDER — VITAL HIGH PROTEIN PO LIQD: 1000.0000 mL | ORAL | Status: DC

## 2016-03-17 MED ORDER — SODIUM CHLORIDE 0.9 % IV BOLUS (SEPSIS)
1000.0000 mL | Freq: Once | INTRAVENOUS | Status: AC
  Administered 2016-03-17: 1000 mL via INTRAVENOUS

## 2016-03-17 MED ORDER — PRO-STAT SUGAR FREE PO LIQD
30.0000 mL | Freq: Two times a day (BID) | ORAL | Status: DC
  Administered 2016-03-17: 30 mL
  Filled 2016-03-17: qty 30

## 2016-03-17 MED ORDER — PIVOT 1.5 CAL PO LIQD: 1000.0000 mL | ORAL | Status: DC

## 2016-03-17 MED ORDER — ALBUMIN HUMAN 5 % IV SOLN
25.0000 g | Freq: Once | INTRAVENOUS | Status: AC
  Administered 2016-03-17: 25 g via INTRAVENOUS
  Filled 2016-03-17: qty 500

## 2016-03-17 MED ORDER — METOPROLOL TARTRATE 5 MG/5ML IV SOLN
5.0000 mg | INTRAVENOUS | Status: DC
  Administered 2016-03-18 – 2016-03-19 (×9): 5 mg via INTRAVENOUS
  Filled 2016-03-17 (×9): qty 5

## 2016-03-17 MED ORDER — SELENIUM 50 MCG PO TABS
200.0000 ug | ORAL_TABLET | Freq: Every day | ORAL | Status: AC
  Administered 2016-03-17 – 2016-03-23 (×7): 200 ug
  Filled 2016-03-17 (×7): qty 4

## 2016-03-17 MED ORDER — INFLUENZA VAC SPLIT QUAD 0.5 ML IM SUSY
0.5000 mL | PREFILLED_SYRINGE | INTRAMUSCULAR | Status: AC
  Administered 2016-03-18: 0.5 mL via INTRAMUSCULAR
  Filled 2016-03-17: qty 0.5

## 2016-03-17 MED ORDER — ALBUMIN HUMAN 5 % IV SOLN
12.5000 g | Freq: Once | INTRAVENOUS | Status: AC
  Administered 2016-03-18: 12.5 g via INTRAVENOUS
  Filled 2016-03-17: qty 250

## 2016-03-17 MED ORDER — PIVOT 1.5 CAL PO LIQD
1000.0000 mL | ORAL | Status: DC
  Administered 2016-03-17 – 2016-03-24 (×8): 1000 mL

## 2016-03-17 MED ORDER — ALBUMIN HUMAN 5 % IV SOLN
INTRAVENOUS | Status: AC
  Administered 2016-03-17: 12.5 g via INTRAVENOUS
  Filled 2016-03-17: qty 250

## 2016-03-17 MED ORDER — MAGNESIUM SULFATE IN D5W 1-5 GM/100ML-% IV SOLN
1.0000 g | Freq: Once | INTRAVENOUS | Status: AC
  Administered 2016-03-17: 1 g via INTRAVENOUS
  Filled 2016-03-17: qty 100

## 2016-03-17 MED ORDER — VITAMIN C 500 MG PO TABS
1000.0000 mg | ORAL_TABLET | Freq: Three times a day (TID) | ORAL | Status: AC
  Administered 2016-03-17 – 2016-03-23 (×21): 1000 mg
  Filled 2016-03-17 (×21): qty 2

## 2016-03-17 NOTE — Progress Notes (Signed)
   Subjective:    Patient ID: Casimiro NeedleArturo Arechiga Marques, male    DOB: Nov 22, 1995, 20 y.o.   MRN: [id number]  HPI  Weaning from pressors.  Remains intubated.  MRI of back pending to determine potential surgery.    Review of Systems  Unable to perform ROS: Intubated       Objective:   Physical Exam  Constitutional: He appears well-developed and well-nourished.  HENT:  Left facial lacerations healing well with sutures in place.  General left facial edema.  Superior medial orbital rim with palpable stepoff.          Assessment & Plan:  Facial lacerations, depressed left anterior table frontal sinus fracture, non-displaced posterior table frontal sinus fracture, mildly displaced left orbital floor fracture  Facial wounds appear to be healing well.  Sutures will be removed around 7 days.  Left frontal sinus fracture will require repair.  Plan to use medial brow laceration for approach.  Need to wait for facial edema to improve, pressors to be weaned, and back to be stabilized, if needed.  Left orbital floor fracture with mild orbital fat herniation.  Does not appear to be affecting orbital volume much.  Probably does not need repair.
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
## 2016-03-17 NOTE — Progress Notes (Signed)
RT note-elevated temp, HR, RR, with a decrease in sp02,, increased fio2 and peep, MD aware, continue to monitor.
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
## 2016-03-17 NOTE — Progress Notes (Addendum)
Patient's temperature elevated. Sp02 low 90s, HR 130s-140. Orders received from MD. Will continue to monitor.

## 2016-03-17 NOTE — Progress Notes (Addendum)
Patient not stable enough for MRI at this time. Will reevaluate in the morning per MD. Temp is improving. Will continue to monitor.

## 2016-03-18 ENCOUNTER — Inpatient Hospital Stay (HOSPITAL_COMMUNITY): Payer: Medicaid Other

## 2016-03-18 LAB — GLUCOSE, CAPILLARY
GLUCOSE-CAPILLARY: 109 mg/dL — AB (ref 65–99)
GLUCOSE-CAPILLARY: 126 mg/dL — AB (ref 65–99)
GLUCOSE-CAPILLARY: 93 mg/dL (ref 65–99)
GLUCOSE-CAPILLARY: 88 mg/dL (ref 65–99)
GLUCOSE-CAPILLARY: 117 mg/dL — AB (ref 65–99)
Glucose-Capillary: 111 mg/dL — ABNORMAL HIGH (ref 65–99)

## 2016-03-18 LAB — BLOOD GAS, ARTERIAL
ACID-BASE DEFICIT: 2.8 mmol/L — AB (ref 0.0–2.0)
ACID-BASE DEFICIT: 2.7 mmol/L — AB (ref 0.0–2.0)
BICARBONATE: 21.4 mmol/L (ref 20.0–28.0)
Bicarbonate: 22.1 mmol/L (ref 20.0–28.0)
DRAWN BY: 313941
Drawn by: 252031
FIO2: 50
FIO2: 55
LHR: 28 {breaths}/min
MECHVT: 500 mL
O2 Saturation: 93.1 %
O2 Saturation: 88.3 %
PATIENT TEMPERATURE: 104
PEEP/CPAP: 8 cmH2O
PEEP: 10 cmH2O
Patient temperature: 99
RATE: 28 resp/min
VT: 500 mL
pCO2 arterial: 41.8 mmHg (ref 32.0–48.0)
pCO2 arterial: 42.2 mmHg (ref 32.0–48.0)
pH, Arterial: 7.346 — ABNORMAL LOW (ref 7.350–7.450)
pH, Arterial: 7.341 — ABNORMAL LOW (ref 7.350–7.450)
pO2, Arterial: 77.5 mmHg — ABNORMAL LOW (ref 83.0–108.0)
pO2, Arterial: 54.4 mmHg — ABNORMAL LOW (ref 83.0–108.0)

## 2016-03-18 LAB — CBC WITH DIFFERENTIAL/PLATELET
BASOS ABS: 0 10*3/uL (ref 0.0–0.1)
Basophils Relative: 0 %
Eosinophils Absolute: 0.1 10*3/uL (ref 0.0–0.7)
Eosinophils Relative: 1 %
HCT: 26.1 % — ABNORMAL LOW (ref 39.0–52.0)
HEMOGLOBIN: 9.3 g/dL — AB (ref 13.0–17.0)
LYMPHS PCT: 10 %
Lymphs Abs: 0.5 10*3/uL — ABNORMAL LOW (ref 0.7–4.0)
MCH: 29.8 pg (ref 26.0–34.0)
MCHC: 35.6 g/dL (ref 30.0–36.0)
MCV: 83.7 fL (ref 78.0–100.0)
MONOS PCT: 10 %
Monocytes Absolute: 0.5 10*3/uL (ref 0.1–1.0)
Neutro Abs: 4 10*3/uL (ref 1.7–7.7)
Neutrophils Relative %: 79 %
Platelets: 95 10*3/uL — ABNORMAL LOW (ref 150–400)
RBC: 3.12 MIL/uL — AB (ref 4.22–5.81)
RDW: 14.6 % (ref 11.5–15.5)
WBC MORPHOLOGY: INCREASED
WBC: 5.1 10*3/uL (ref 4.0–10.5)

## 2016-03-18 LAB — PHOSPHORUS
Phosphorus: <1 mg/dL — CL (ref 2.5–4.6)
Phosphorus: 2 mg/dL — ABNORMAL LOW (ref 2.5–4.6)

## 2016-03-18 LAB — BASIC METABOLIC PANEL
ANION GAP: 4 — AB (ref 5–15)
BUN: 11 mg/dL (ref 6–20)
CHLORIDE: 111 mmol/L (ref 101–111)
CO2: 22 mmol/L (ref 22–32)
Calcium: 7.8 mg/dL — ABNORMAL LOW (ref 8.9–10.3)
Creatinine, Ser: 0.89 mg/dL (ref 0.61–1.24)
GFR calc non Af Amer: >60 mL/min (ref >60)
Glucose, Bld: 118 mg/dL — ABNORMAL HIGH (ref 65–99)
POTASSIUM: 3.5 mmol/L (ref 3.5–5.1)
SODIUM: 137 mmol/L (ref 135–145)

## 2016-03-18 LAB — CBC
HCT: 21.9 % — ABNORMAL LOW (ref 39.0–52.0)
Hemoglobin: 7.9 g/dL — ABNORMAL LOW (ref 13.0–17.0)
MCH: 30.7 pg (ref 26.0–34.0)
MCHC: 36.1 g/dL — AB (ref 30.0–36.0)
MCV: 85.2 fL (ref 78.0–100.0)
Platelets: 84 10*3/uL — ABNORMAL LOW (ref 150–400)
RBC: 2.57 MIL/uL — ABNORMAL LOW (ref 4.22–5.81)
RDW: 14.1 % (ref 11.5–15.5)
WBC: 4.1 10*3/uL (ref 4.0–10.5)

## 2016-03-18 LAB — MAGNESIUM
MAGNESIUM: 1.6 mg/dL — AB (ref 1.7–2.4)
Magnesium: 1.7 mg/dL (ref 1.7–2.4)

## 2016-03-18 LAB — TRIGLYCERIDES: TRIGLYCERIDES: 52 mg/dL (ref <150)

## 2016-03-18 LAB — PREPARE RBC (CROSSMATCH)

## 2016-03-18 MED ORDER — FUROSEMIDE 10 MG/ML IJ SOLN
20.0000 mg | Freq: Once | INTRAMUSCULAR | Status: AC
  Administered 2016-03-18: 20 mg via INTRAVENOUS
  Filled 2016-03-18: qty 2

## 2016-03-18 MED ORDER — DEXTROSE 5 % IV SOLN
40.0000 meq | Freq: Once | INTRAVENOUS | Status: AC
  Administered 2016-03-18: 40 meq via INTRAVENOUS
  Filled 2016-03-18: qty 9.09

## 2016-03-18 MED ORDER — SODIUM CHLORIDE 0.9 % IV SOLN: Freq: Once | INTRAVENOUS | Status: DC

## 2016-03-18 MED ORDER — LIDOCAINE HCL (PF) 1 % IJ SOLN
INTRAMUSCULAR | Status: AC
  Filled 2016-03-18: qty 5

## 2016-03-18 NOTE — Progress Notes (Signed)
Sputum sample obtained and sent to lab by RT. 

## 2016-03-18 NOTE — Progress Notes (Signed)
   Subjective:    Patient ID: Luis [name], male    DOB: 1995/04/15, 20 y.o.   MRN: [id number]  HPI    Review of Systems     Objective:   Physical Exam        Assessment & Plan:  Left frontal sinus fracture, left orbital floor fracture, facial lacerations  Will remove sutures mid-week next week.  Facial surgery tentatively planned for 03/30/16.  Hoping this gives time for stabilization of cardiopulmonary status and thoracic spine.
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
## 2016-03-18 NOTE — Progress Notes (Signed)
CRITICAL VALUE ALERT  Critical value received:  Phos <1  Date of notification:  03/18/2016   Time of notification:  0501  Critical value read back:Yes.    Nurse who received alert:  Everette RankJennifer Zetta Stoneman, RN  MD notified (1st page):  Dr. Janee Mornhompson  Time of first page:  [phone]  MD notified (2nd page):  Time of second page:  Responding MD:  Dr. Janee Mornhompson  Time MD responded:  [phone]  MD aware. New orders for K Phos received.

## 2016-03-18 NOTE — Progress Notes (Signed)
Follow up - Trauma and Critical Care  Patient Details:    [name] is an 20 y.o. male.  Lines/tubes : Airway 7.5 mm (Active)  Secured at (cm) 25 cm 03/18/2016  3:15 AM  Measured From Lips 03/18/2016  3:15 AM  Secured Location Left 03/18/2016  3:15 AM  Secured By [employer] Tube Holder 03/18/2016  3:15 AM  Tube Holder Repositioned Yes 03/18/2016  3:15 AM  Cuff Pressure (cm H2O) 26 cm H2O 03/18/2016  3:15 AM  Site Condition Dry 03/18/2016  3:15 AM     CVC Triple Lumen 03/16/16 Right Subclavian (Active)  Indication for Insertion or Continuance of Line Prolonged intravenous therapies 03/17/2016  8:00 PM  Site Assessment Clean;Dry;Intact 03/17/2016  8:00 PM  Proximal Lumen Status Infusing 03/17/2016  8:00 PM  Medial Infusing 03/17/2016  8:00 PM  Distal Lumen Status Other (Comment) 03/17/2016  8:00 PM  Dressing Type Transparent;Occlusive 03/17/2016  8:00 PM  Dressing Status Clean;Dry;Intact;Antimicrobial disc in place 03/17/2016  8:00 PM  Line Care Other (Comment) 03/16/2016 11:30 PM  Dressing Change Due 03/23/16 03/17/2016  8:00 PM     Arterial Line 03/16/16 (Active)  Site Assessment Clean;Dry;Intact 03/17/2016  8:00 PM  Line Status Pulsatile blood flow 03/17/2016  8:00 PM  Art Line Waveform Whip 03/17/2016  8:00 PM  Art Line Interventions Zeroed and calibrated;Leveled;Connections checked and tightened;Flushed per protocol 03/17/2016  8:00 PM  Color/Movement/Sensation Capillary refill less than 3 sec 03/17/2016  8:00 PM  Dressing Type Transparent;Occlusive 03/17/2016  8:00 PM  Dressing Status Clean;Dry;Intact;Antimicrobial disc in place 03/17/2016  8:00 PM  Dressing Change Due 03/23/16 03/16/2016  8:00 PM     Chest Tube Right (Active)  Suction -20 cm H2O 03/17/2016  8:00 PM  Chest Tube Air Leak Minimal 03/17/2016  8:00 PM  Patency Intervention Tip/tilt 03/17/2016  8:00 PM  Drainage Description Serosanguineous 03/17/2016  8:00 PM  Dressing Status Clean;Dry;Intact  03/17/2016  8:00 PM  Dressing Intervention Other (Comment) 03/16/2016  8:00 PM  Site Assessment Other (Comment) 03/17/2016  8:00 AM  Surrounding Skin Unable to view 03/17/2016  8:00 PM  Output (mL) 250 mL 03/18/2016  6:00 AM     Chest Tube 3 Left;Anterior Pleural (Active)  Suction -20 cm H2O 03/17/2016  8:00 PM  Chest Tube Air Leak Minimal 03/17/2016  8:00 PM  Patency Intervention Tip/tilt 03/17/2016  8:00 PM  Drainage Description Serosanguineous 03/17/2016  8:00 PM  Dressing Status Clean;Dry;Intact 03/17/2016  8:00 PM  Dressing Intervention Dressing changed 03/17/2016  2:00 AM  Site Assessment Other (Comment) 03/17/2016  8:00 AM  Surrounding Skin Unable to view 03/17/2016  8:00 PM  Output (mL) 300 mL 03/18/2016  6:00 AM     NG/OG Tube Orogastric 18 Fr. Right mouth Xray Measured external length of tube (Active)  Site Assessment Clean;Dry;Intact 03/17/2016  8:00 PM  Ongoing Placement Verification Auscultation 03/17/2016  8:00 PM  Status Infusing tube feed 03/17/2016  8:00 PM  Drainage Appearance Bloody;Brown 03/16/2016  8:00 PM  Output (mL) 100 mL 03/17/2016  6:00 AM     Urethral Catheter Aborst RN  Straight-tip 16 Fr. (Active)  Indication for Insertion or Continuance of Catheter Unstable spinal/crush injuries;Unstable critical patients (first 24-48 hours) 03/17/2016  8:00 PM  Site Assessment Clean;Intact 03/17/2016  8:00 PM  Catheter Maintenance Bag below level of bladder;Catheter secured;Drainage bag/tubing not touching floor;Insertion date on drainage bag;No dependent loops;Seal intact 03/17/2016  8:00 PM  Collection Container Standard drainage bag 03/17/2016  8:00 PM  Securement Method Securing device (  Describe) 03/17/2016  8:00 PM  Urinary Catheter Interventions Unclamped 03/17/2016  8:00 PM  Output (mL) 125 mL 03/18/2016  6:00 AM    Microbiology/Sepsis markers: No results found for this or any previous visit.  Anti-infectives:  Anti-infectives    None      Best  Practice/Protocols:  VTE Prophylaxis: Mechanical GI Prophylaxis: Proton Pump Inhibitor Continous Sedation On Versed and fentanyl  Consults: Treatment Team:  Lisbeth RenshawNeelesh Nundkumar, MD Yolonda KidaJason Patrick Rogers, MD    Events:  Subjective:    Overnight Issues: Patient developed worsening hypoxemia last evening.    Objective:  Vital signs for last 24 hours: Temp:  [101 F (38.3 C)-103 F (39.4 C)] 102 F (38.9 C) (12/22 0500) Pulse Rate:  [105-154] 121 (12/22 0730) Resp:  [24-39] 30 (12/22 0730) BP: (94-134)/(42-74) 115/47 (12/22 0730) SpO2:  [88 %-100 %] 97 % (12/22 0730) Arterial Line BP: (121-176)/(29-49) 150/38 (12/22 0630) FiO2 (%):  [40 %-70 %] 60 % (12/22 0615) Weight:  [79.2 kg (174 lb 9.7 oz)-79.6 kg (175 lb 7.8 oz)] 79.6 kg (175 lb 7.8 oz) (12/22 0630)  Hemodynamic parameters for last 24 hours: CVP:  [4 mmHg-10 mmHg] 7 mmHg  Intake/Output from previous day: 12/21 0701 - 12/22 0700 In: 4364.5 [I.V.:2955.1; NG/GT:974.4; IV Piggyback:435] Out: 2565 [Urine:1410; Chest Tube:1155]  Intake/Output this shift: No intake/output data recorded.  Vent settings for last 24 hours: Vent Mode: PRVC FiO2 (%):  [40 %-70 %] 60 % Set Rate:  [28 bmp] 28 bmp Vt Set:  [500 mL] 500 mL PEEP:  [5 cmH20-10 cmH20] 10 cmH20 Plateau Pressure:  [16 cmH20-20 cmH20] 18 cmH20  Physical Exam:  General: no respiratory distress and will awaken Neuro: oriented, nonfocal exam, RASS -1, weakness right lower extremity, weakness left lower extremity and paraplegic Resp: clear to auscultation bilaterally CVS: Sinus tachycardia GI: soft, nontender, BS WNL, no r/g and tolerating tube feedings Extremities: edema 1+ and Mostly upper extremity swelling  Results for orders placed or performed during the hospital encounter of 03/15/16 (from the past 24 hour(s))  Magnesium     Status: Abnormal   Collection Time: 03/17/16  9:52 AM  Result Value Ref Range   Magnesium 1.6 (L) 1.7 - 2.4 mg/dL  Phosphorus      Status: Abnormal   Collection Time: 03/17/16  9:52 AM  Result Value Ref Range   Phosphorus 1.4 (L) 2.5 - 4.6 mg/dL  Lactic acid, plasma     Status: None   Collection Time: 03/17/16  9:52 AM  Result Value Ref Range   Lactic Acid, Venous 1.2 0.5 - 1.9 mmol/L  Lactic acid, plasma     Status: None   Collection Time: 03/17/16  1:28 PM  Result Value Ref Range   Lactic Acid, Venous 0.9 0.5 - 1.9 mmol/L  Glucose, capillary     Status: Abnormal   Collection Time: 03/17/16  1:58 PM  Result Value Ref Range   Glucose-Capillary 107 (H) 65 - 99 mg/dL  Glucose, capillary     Status: Abnormal   Collection Time: 03/17/16  3:52 PM  Result Value Ref Range   Glucose-Capillary 113 (H) 65 - 99 mg/dL  Magnesium     Status: None   Collection Time: 03/17/16  6:53 PM  Result Value Ref Range   Magnesium 1.7 1.7 - 2.4 mg/dL  Phosphorus     Status: Abnormal   Collection Time: 03/17/16  6:53 PM  Result Value Ref Range   Phosphorus 1.6 (L) 2.5 - 4.6 mg/dL  Glucose,  capillary     Status: Abnormal   Collection Time: 03/17/16  7:41 PM  Result Value Ref Range   Glucose-Capillary 144 (H) 65 - 99 mg/dL  Blood gas, arterial     Status: Abnormal   Collection Time: 03/18/16  3:13 AM  Result Value Ref Range   FIO2 50.00    Delivery systems VENTILATOR    Mode PRESSURE REGULATED VOLUME CONTROL    VT 500 mL   LHR 28 resp/min   Peep/cpap 8.0 cm H20   pH, Arterial 7.346 (L) 7.350 - 7.450   pCO2 arterial 41.8 32.0 - 48.0 mmHg   pO2, Arterial 77.5 (L) 83.0 - 108.0 mmHg   Bicarbonate 21.4 20.0 - 28.0 mmol/L   Acid-base deficit 2.8 (H) 0.0 - 2.0 mmol/L   O2 Saturation 93.1 %   Patient temperature 104.0    Collection site A-LINE    Drawn by [id number]    Sample type ARTERIAL DRAW    Allens test (pass/fail) PASS PASS  CBC     Status: Abnormal   Collection Time: 03/18/16  3:59 AM  Result Value Ref Range   WBC 4.1 4.0 - 10.5 K/uL   RBC 2.57 (L) 4.22 - 5.81 MIL/uL   Hemoglobin 7.9 (L) 13.0 - 17.0 g/dL   HCT 04.5 (L)  40.9 - 52.0 %   MCV 85.2 78.0 - 100.0 fL   MCH 30.7 26.0 - 34.0 pg   MCHC 36.1 (H) 30.0 - 36.0 g/dL   RDW 81.1 91.4 - 78.2 %   Platelets 84 (L) 150 - 400 K/uL  Basic metabolic panel     Status: Abnormal   Collection Time: 03/18/16  3:59 AM  Result Value Ref Range   Sodium 137 135 - 145 mmol/L   Potassium 3.5 3.5 - 5.1 mmol/L   Chloride 111 101 - 111 mmol/L   CO2 22 22 - 32 mmol/L   Glucose, Bld 118 (H) 65 - 99 mg/dL   BUN 11 6 - 20 mg/dL   Creatinine, Ser 9.56 0.61 - 1.24 mg/dL   Calcium 7.8 (L) 8.9 - 10.3 mg/dL   GFR calc non Af Amer >60 >60 mL/min   GFR calc Af Amer >60 >60 mL/min   Anion gap 4 (L) 5 - 15  Magnesium     Status: None   Collection Time: 03/18/16  3:59 AM  Result Value Ref Range   Magnesium 1.7 1.7 - 2.4 mg/dL  Phosphorus     Status: Abnormal   Collection Time: 03/18/16  3:59 AM  Result Value Ref Range   Phosphorus <1.0 (LL) 2.5 - 4.6 mg/dL  Glucose, capillary     Status: Abnormal   Collection Time: 03/18/16  3:59 AM  Result Value Ref Range   Glucose-Capillary 109 (H) 65 - 99 mg/dL  Glucose, capillary     Status: Abnormal   Collection Time: 03/18/16  7:22 AM  Result Value Ref Range   Glucose-Capillary 126 (H) 65 - 99 mg/dL     Assessment/Plan:   NEURO  Altered Mental Status:  sedation   Plan: Intermittently gets agitated.  Will switch to Precedex.  Add low dose of Klonopin and Seroquel  PULM  Respiratory Acidosis (acute and mild) Atelectasis/collapse (bibasilar) ARDS (pulmonary etiology bilateral pulmonary contsions) Chest Wall Trauma rib fractures and Pneumothorax (traumatic)   Plan: Right chest tube suction increased to -40 based on CXR with right basilar PTX which IS NOT TENSION PTX CLINICALLY OR RADIOLOGICALLY.  BIlateral air leakage, more on the right  than the left.  Left increased to -30  CARDIO  Sinus Tachycardia   Plan: No specific treatment increase.  On B-blocker  RENAL  Hypervolemia   Plan: 1800 cc more volume yesterday, 5.5 liter  increased since admission.  Renal function is fine.  CVP 7  GI  No specific issues.    Plan: Continue tube feedings.  ID  No known infectious problems but the patient is spiking fevers to 103   Plan: No empiric antibiotics, will culture all sources  HEME  Anemia acute blood loss anemia)   Plan: One unit of PRBCs.  Diuresis afterwards  ENDO No known issues.   Plan: CPM  Global Issues  ARDS, volume overload, anemia, pulmonary contusions evolving.  ARDS seems to be less of a component than hypervolemia mainly because pulmonary compliance is not that bad at the point.  He does not have a tension pneumothorax.  Will get one unit of blood.    LOS: 3 days   Additional comments:I reviewed the patient's new clinical lab test results. cbc/bmet/abg and I reviewed the patients new imaging test results. cxr  Critical Care Total Time*: 45 Minutes  Mattson Dayal 03/18/2016  *Care during the described time interval was provided by me and/or other providers on the critical care team.  I have reviewed this patient's available data, including medical history, events of note, physical examination and test results as part of my evaluation.

## 2016-03-18 NOTE — Progress Notes (Addendum)
Patient HR sustaining in 150s, febrile, CVP 7. HR unchanged with pain medication. Notified Dr. Janee Mornhompson. Received orders for one time dose of albumin and scheduled lopressor.

## 2016-03-18 NOTE — Progress Notes (Signed)
PEEP increased to 10 per Dr. Janee Mornhompson

## 2016-03-19 ENCOUNTER — Encounter (HOSPITAL_COMMUNITY): Payer: Self-pay | Admitting: *Deleted

## 2016-03-19 ENCOUNTER — Inpatient Hospital Stay (HOSPITAL_COMMUNITY): Payer: Medicaid Other

## 2016-03-19 LAB — BLOOD GAS, ARTERIAL
ACID-BASE EXCESS: 1.7 mmol/L (ref 0.0–2.0)
Bicarbonate: 26.2 mmol/L (ref 20.0–28.0)
DRAWN BY: 313941
FIO2: 50
MECHVT: 500 mL
O2 SAT: 96.5 %
PATIENT TEMPERATURE: 99.7
PCO2 ART: 46.2 mmHg (ref 32.0–48.0)
PEEP/CPAP: 8 cmH2O
PH ART: 7.376 (ref 7.350–7.450)
RATE: 28 resp/min
pO2, Arterial: 85.1 mmHg (ref 83.0–108.0)

## 2016-03-19 LAB — CBC WITH DIFFERENTIAL/PLATELET
BASOS ABS: 0 10*3/uL (ref 0.0–0.1)
BASOS PCT: 0 %
Eosinophils Absolute: 0.1 10*3/uL (ref 0.0–0.7)
Eosinophils Relative: 2 %
HEMATOCRIT: 23.4 % — AB (ref 39.0–52.0)
HEMOGLOBIN: 8.3 g/dL — AB (ref 13.0–17.0)
LYMPHS ABS: 0.7 10*3/uL (ref 0.7–4.0)
LYMPHS PCT: 11 %
MCH: 30.5 pg (ref 26.0–34.0)
MCHC: 35.5 g/dL (ref 30.0–36.0)
MCV: 86 fL (ref 78.0–100.0)
MONOS PCT: 12 %
Monocytes Absolute: 0.8 10*3/uL (ref 0.1–1.0)
NEUTROS ABS: 4.8 10*3/uL (ref 1.7–7.7)
Neutrophils Relative %: 75 %
Platelets: 88 10*3/uL — ABNORMAL LOW (ref 150–400)
RBC: 2.72 MIL/uL — ABNORMAL LOW (ref 4.22–5.81)
RDW: 15.2 % (ref 11.5–15.5)
WBC Morphology: INCREASED
WBC: 6.4 10*3/uL (ref 4.0–10.5)

## 2016-03-19 LAB — TYPE AND SCREEN
ABO/RH(D): O POS
ANTIBODY SCREEN: NEGATIVE
UNIT DIVISION: 0
UNIT DIVISION: 0
Unit division: 0
Unit division: 0
Unit division: 0

## 2016-03-19 LAB — BASIC METABOLIC PANEL
Anion gap: 4 — ABNORMAL LOW (ref 5–15)
BUN: 15 mg/dL (ref 6–20)
CALCIUM: 7.7 mg/dL — AB (ref 8.9–10.3)
CHLORIDE: 108 mmol/L (ref 101–111)
CO2: 27 mmol/L (ref 22–32)
CREATININE: 0.96 mg/dL (ref 0.61–1.24)
GFR calc Af Amer: >60 mL/min (ref >60)
GFR calc non Af Amer: >60 mL/min (ref >60)
GLUCOSE: 132 mg/dL — AB (ref 65–99)
Potassium: 3.4 mmol/L — ABNORMAL LOW (ref 3.5–5.1)
Sodium: 139 mmol/L (ref 135–145)

## 2016-03-19 LAB — GLUCOSE, CAPILLARY
GLUCOSE-CAPILLARY: 114 mg/dL — AB (ref 65–99)
GLUCOSE-CAPILLARY: 114 mg/dL — AB (ref 65–99)
GLUCOSE-CAPILLARY: 127 mg/dL — AB (ref 65–99)
GLUCOSE-CAPILLARY: 140 mg/dL — AB (ref 65–99)
Glucose-Capillary: 146 mg/dL — ABNORMAL HIGH (ref 65–99)
Glucose-Capillary: 111 mg/dL — ABNORMAL HIGH (ref 65–99)

## 2016-03-19 LAB — BLOOD CULTURE ID PANEL (REFLEXED)
Acinetobacter baumannii: NOT DETECTED
CANDIDA GLABRATA: NOT DETECTED
CANDIDA KRUSEI: NOT DETECTED
CANDIDA PARAPSILOSIS: NOT DETECTED
Candida albicans: NOT DETECTED
Candida tropicalis: NOT DETECTED
ESCHERICHIA COLI: NOT DETECTED
Enterobacter cloacae complex: NOT DETECTED
Enterobacteriaceae species: NOT DETECTED
Enterococcus species: NOT DETECTED
Haemophilus influenzae: NOT DETECTED
KLEBSIELLA OXYTOCA: NOT DETECTED
Klebsiella pneumoniae: NOT DETECTED
Listeria monocytogenes: NOT DETECTED
Methicillin resistance: NOT DETECTED
Neisseria meningitidis: NOT DETECTED
PROTEUS SPECIES: NOT DETECTED
Pseudomonas aeruginosa: NOT DETECTED
SERRATIA MARCESCENS: NOT DETECTED
STAPHYLOCOCCUS AUREUS BCID: NOT DETECTED
STAPHYLOCOCCUS SPECIES: DETECTED — AB
STREPTOCOCCUS PNEUMONIAE: NOT DETECTED
Streptococcus agalactiae: NOT DETECTED
Streptococcus pyogenes: NOT DETECTED
Streptococcus species: NOT DETECTED

## 2016-03-19 LAB — MRSA CULTURE: Culture: DETECTED

## 2016-03-19 LAB — URINE CULTURE
Culture: NO GROWTH
Special Requests: NORMAL

## 2016-03-19 LAB — MRSA PCR SCREENING: MRSA BY PCR: NEGATIVE

## 2016-03-19 MED ORDER — VANCOMYCIN HCL 10 G IV SOLR
1500.0000 mg | Freq: Once | INTRAVENOUS | Status: AC
  Administered 2016-03-19: 1500 mg via INTRAVENOUS
  Filled 2016-03-19: qty 1500

## 2016-03-19 MED ORDER — FUROSEMIDE 10 MG/ML IJ SOLN
40.0000 mg | Freq: Once | INTRAMUSCULAR | Status: AC
  Administered 2016-03-19: 40 mg via INTRAVENOUS
  Filled 2016-03-19: qty 4

## 2016-03-19 MED ORDER — METOPROLOL TARTRATE 25 MG/10 ML ORAL SUSPENSION
12.5000 mg | Freq: Two times a day (BID) | ORAL | Status: DC
  Administered 2016-03-19 – 2016-04-11 (×40): 12.5 mg
  Filled 2016-03-19 (×42): qty 10

## 2016-03-19 MED ORDER — PIPERACILLIN-TAZOBACTAM 3.375 G IVPB
3.3750 g | Freq: Three times a day (TID) | INTRAVENOUS | Status: DC
  Administered 2016-03-19 – 2016-03-21 (×6): 3.375 g via INTRAVENOUS
  Filled 2016-03-19 (×7): qty 50

## 2016-03-19 MED ORDER — BISACODYL 10 MG RE SUPP
10.0000 mg | Freq: Every day | RECTAL | Status: DC | PRN
Start: 2016-03-19 — End: 2016-03-28
  Administered 2016-03-19 – 2016-03-24 (×2): 10 mg via RECTAL
  Filled 2016-03-19 (×2): qty 1

## 2016-03-19 MED ORDER — VANCOMYCIN HCL IN DEXTROSE 1-5 GM/200ML-% IV SOLN
1000.0000 mg | Freq: Three times a day (TID) | INTRAVENOUS | Status: DC
  Administered 2016-03-19 – 2016-03-21 (×5): 1000 mg via INTRAVENOUS
  Filled 2016-03-19 (×6): qty 200

## 2016-03-19 NOTE — Progress Notes (Signed)
Follow up - Trauma and Critical Care  Patient Details:    Luis [name] is an 20 y.o. male.  Lines/tubes : Airway 7.5 mm (Active)  Secured at (cm) 26 cm 03/19/2016  8:01 AM  Measured From Lips 03/19/2016  8:01 AM  Secured Location Center 03/19/2016  8:01 AM  Secured By [employer] 03/19/2016  8:01 AM  Tube Holder Repositioned Yes 03/19/2016  8:01 AM  Cuff Pressure (cm H2O) 24 cm H2O 03/19/2016  3:50 AM  Site Condition Dry 03/19/2016  8:01 AM     CVC Triple Lumen 03/16/16 Right Subclavian (Active)  Indication for Insertion or Continuance of Line Prolonged intravenous therapies 03/19/2016  8:00 AM  Site Assessment Clean;Dry;Intact 03/19/2016  8:00 AM  Proximal Lumen Status Infusing;Flushed 03/19/2016  8:00 AM  Medial Infusing;Flushed 03/19/2016  8:00 AM  Distal Lumen Status Infusing;Flushed 03/19/2016  8:00 AM  Dressing Type Transparent;Occlusive 03/19/2016  8:00 AM  Dressing Status Clean;Dry;Intact;Antimicrobial disc in place 03/19/2016  8:00 AM  Line Care Connections checked and tightened;Zeroed and calibrated;Leveled 03/19/2016  8:00 AM  Dressing Change Due 03/23/16 03/19/2016  8:00 AM     Chest Tube Right (Active)  Suction -40 cm H2O 03/19/2016  8:30 AM  Chest Tube Air Leak Moderate 03/19/2016  8:30 AM  Patency Intervention Tip/tilt;Milked 03/19/2016  8:30 AM  Drainage Description Serosanguineous 03/19/2016  8:30 AM  Dressing Status Clean;Dry;Intact 03/19/2016  8:30 AM  Dressing Intervention New dressing 03/19/2016  8:30 AM  Site Assessment Intact 03/19/2016  8:30 AM  Surrounding Skin Intact 03/19/2016  8:30 AM  Output (mL) 90 mL 03/19/2016  6:00 AM     Chest Tube 3 Left;Anterior Pleural (Active)  Suction -40 cm H2O 03/19/2016  9:52 AM  Chest Tube Air Leak None 03/19/2016  8:30 AM  Patency Intervention Milked;Tip/tilt 03/19/2016  8:30 AM  Drainage Description Serosanguineous 03/19/2016  8:30 AM  Dressing Status Clean;Dry;Intact 03/19/2016  8:30 AM   Dressing Intervention New dressing 03/19/2016  8:30 AM  Site Assessment Dry;Clean 03/19/2016  8:30 AM  Surrounding Skin Dry 03/19/2016  8:30 AM  Output (mL) 165 mL 03/19/2016  6:00 AM     NG/OG Tube Orogastric 18 Fr. Right mouth Xray Measured external length of tube (Active)  Site Assessment Clean;Dry;Intact 03/19/2016  8:00 AM  Ongoing Placement Verification Auscultation 03/19/2016  8:00 AM  Status Infusing tube feed 03/19/2016  8:00 AM  Drainage Appearance Bloody;Brown 03/19/2016  8:00 AM  Intake (mL) 120 mL 03/18/2016 10:00 PM  Output (mL) 100 mL 03/17/2016  6:00 AM     Urethral Catheter Aborst RN  Straight-tip 16 Fr. (Active)  Indication for Insertion or Continuance of Catheter Unstable spinal/crush injuries;Unstable critical patients (first 24-48 hours) 03/19/2016  8:00 AM  Site Assessment Clean;Intact 03/19/2016  8:00 AM  Catheter Maintenance Bag below level of bladder;Catheter secured;Drainage bag/tubing not touching floor;Insertion date on drainage bag;No dependent loops;Seal intact 03/19/2016  8:00 AM  Collection Container Standard drainage bag 03/19/2016  8:00 AM  Securement Method Securing device (Describe) 03/19/2016  8:00 AM  Urinary Catheter Interventions Unclamped 03/19/2016  8:00 AM  Output (mL) 100 mL 03/19/2016  6:00 AM    Microbiology/Sepsis markers: Results for orders placed or performed during the hospital encounter of 03/15/16  MRSA culture     Status: None   Collection Time: 03/17/16  1:39 PM  Result Value Ref Range Status   Specimen Description NASOPHARYNGEAL  Final   Special Requests NONE  Final   Culture MRSA DETECTED  Final  Report Status 03/19/2016 FINAL  Final  Culture, respiratory (NON-Expectorated)     Status: None (Preliminary result)   Collection Time: 03/18/16 11:12 AM  Result Value Ref Range Status   Specimen Description TRACHEAL ASPIRATE  Final   Special Requests Normal  Final   Gram Stain   Final    ABUNDANT WBC PRESENT, PREDOMINANTLY  PMN RARE SQUAMOUS EPITHELIAL CELLS PRESENT ABUNDANT GRAM POSITIVE COCCI IN PAIRS IN CLUSTERS MODERATE GRAM NEGATIVE RODS    Culture PENDING  Incomplete   Report Status PENDING  Incomplete    Anti-infectives:  Anti-infectives    None      Best Practice/Protocols:  VTE Prophylaxis: Lovenox (prophylaxtic dose) GI Prophylaxis: Proton Pump Inhibitor Continous Sedation  Consults: Treatment Team:  Lisbeth RenshawNeelesh Nundkumar, MD Yolonda KidaJason Patrick Rogers, MD    Events:  Subjective:    Overnight Issues: On versed and fentanyl  Objective:  Vital signs for last 24 hours: Temp:  [99 F (37.2 C)-103 F (39.4 C)] 100 F (37.8 C) (12/23 0800) Pulse Rate:  [104-147] 104 (12/23 0900) Resp:  [22-38] 30 (12/23 0900) BP: (94-134)/(39-73) 100/48 (12/23 0900) SpO2:  [93 %-100 %] 100 % (12/23 0900) Arterial Line BP: (172-179)/(58-64) 179/64 (12/22 1115) FiO2 (%):  [50 %-60 %] 50 % (12/23 0950) Weight:  [79.2 kg (174 lb 9.7 oz)] 79.2 kg (174 lb 9.7 oz) (12/23 0600)  Hemodynamic parameters for last 24 hours: CVP:  [6 mmHg-11 mmHg] 11 mmHg  Intake/Output from previous day: 12/22 0701 - 12/23 0700 In: 4570.9 [I.V.:2805.9; Blood:325; NG/GT:1440] Out: 4220 [Urine:3580; Chest Tube:640]  Intake/Output this shift: Total I/O In: 172 [I.V.:117; NG/GT:55] Out: -   Vent settings for last 24 hours: Vent Mode: PRVC FiO2 (%):  [50 %-60 %] 50 % Set Rate:  [28 bmp] 28 bmp Vt Set:  [500 mL] 500 mL PEEP:  [8 cmH20-10 cmH20] 8 cmH20 Plateau Pressure:  [20 cmH20-27 cmH20] 27 cmH20  Physical Exam:  General: no respiratory distress and sedated on ARDS protocol I beleive Neuro: nonfocal exam, weakness right lower extremity and weakness left lower extremity Resp: rhonchi LLL and LUL and wheezes LLL and LUL CVS: regular rate and rhythm, S1, S2 normal, no murmur, click, rub or gallop and Intermittent sinus tachycardia GI: soft, nontender, BS WNL, no r/g and tolerating tube feedings well Extremities: no  edema, no erythema, pulses WNL  Results for orders placed or performed during the hospital encounter of 03/15/16 (from the past 24 hour(s))  Culture, respiratory (NON-Expectorated)     Status: None (Preliminary result)   Collection Time: 03/18/16 11:12 AM  Result Value Ref Range   Specimen Description TRACHEAL ASPIRATE    Special Requests Normal    Gram Stain      ABUNDANT WBC PRESENT, PREDOMINANTLY PMN RARE SQUAMOUS EPITHELIAL CELLS PRESENT ABUNDANT GRAM POSITIVE COCCI IN PAIRS IN CLUSTERS MODERATE GRAM NEGATIVE RODS    Culture PENDING    Report Status PENDING   Glucose, capillary     Status: None   Collection Time: 03/18/16 11:45 AM  Result Value Ref Range   Glucose-Capillary 93 65 - 99 mg/dL  Blood gas, arterial     Status: Abnormal   Collection Time: 03/18/16 12:05 PM  Result Value Ref Range   FIO2 55.00    Delivery systems VENTILATOR    Mode PRESSURE REGULATED VOLUME CONTROL    VT 500 mL   LHR 28 resp/min   Peep/cpap 10.0 cm H20   pH, Arterial 7.341 (L) 7.350 - 7.450   pCO2 arterial  42.2 32.0 - 48.0 mmHg   pO2, Arterial 54.4 (L) 83.0 - 108.0 mmHg   Bicarbonate 22.1 20.0 - 28.0 mmol/L   Acid-base deficit 2.7 (H) 0.0 - 2.0 mmol/L   O2 Saturation 88.3 %   Patient temperature 99.0    Collection site A-LINE    Drawn by [phone]    Sample type ARTERIAL DRAW    Allens test (pass/fail) PASS PASS  Glucose, capillary     Status: None   Collection Time: 03/18/16  3:19 PM  Result Value Ref Range   Glucose-Capillary 88 65 - 99 mg/dL  Magnesium     Status: Abnormal   Collection Time: 03/18/16  3:40 PM  Result Value Ref Range   Magnesium 1.6 (L) 1.7 - 2.4 mg/dL  Phosphorus     Status: Abnormal   Collection Time: 03/18/16  3:40 PM  Result Value Ref Range   Phosphorus 2.0 (L) 2.5 - 4.6 mg/dL  CBC with Differential/Platelet     Status: Abnormal   Collection Time: 03/18/16  3:40 PM  Result Value Ref Range   WBC 5.1 4.0 - 10.5 K/uL   RBC 3.12 (L) 4.22 - 5.81 MIL/uL    Hemoglobin 9.3 (L) 13.0 - 17.0 g/dL   HCT 14.726.1 (L) 82.939.0 - 56.252.0 %   MCV 83.7 78.0 - 100.0 fL   MCH 29.8 26.0 - 34.0 pg   MCHC 35.6 30.0 - 36.0 g/dL   RDW 13.014.6 86.511.5 - 78.415.5 %   Platelets 95 (L) 150 - 400 K/uL   Neutrophils Relative % 79 %   Lymphocytes Relative 10 %   Monocytes Relative 10 %   Eosinophils Relative 1 %   Basophils Relative 0 %   Neutro Abs 4.0 1.7 - 7.7 K/uL   Lymphs Abs 0.5 (L) 0.7 - 4.0 K/uL   Monocytes Absolute 0.5 0.1 - 1.0 K/uL   Eosinophils Absolute 0.1 0.0 - 0.7 K/uL   Basophils Absolute 0.0 0.0 - 0.1 K/uL   RBC Morphology BURR CELLS    WBC Morphology INCREASED BANDS (>20% BANDS)   Glucose, capillary     Status: Abnormal   Collection Time: 03/18/16  7:18 PM  Result Value Ref Range   Glucose-Capillary 117 (H) 65 - 99 mg/dL  Triglycerides     Status: None   Collection Time: 03/18/16 10:04 PM  Result Value Ref Range   Triglycerides 52 <150 mg/dL  Glucose, capillary     Status: Abnormal   Collection Time: 03/18/16 11:42 PM  Result Value Ref Range   Glucose-Capillary 111 (H) 65 - 99 mg/dL  Glucose, capillary     Status: Abnormal   Collection Time: 03/19/16  3:09 AM  Result Value Ref Range   Glucose-Capillary 146 (H) 65 - 99 mg/dL  CBC with Differential/Platelet     Status: Abnormal   Collection Time: 03/19/16  6:00 AM  Result Value Ref Range   WBC 6.4 4.0 - 10.5 K/uL   RBC 2.72 (L) 4.22 - 5.81 MIL/uL   Hemoglobin 8.3 (L) 13.0 - 17.0 g/dL   HCT 69.623.4 (L) 29.539.0 - 28.452.0 %   MCV 86.0 78.0 - 100.0 fL   MCH 30.5 26.0 - 34.0 pg   MCHC 35.5 30.0 - 36.0 g/dL   RDW 13.215.2 44.011.5 - 10.215.5 %   Platelets 88 (L) 150 - 400 K/uL   Neutrophils Relative % 75 %   Lymphocytes Relative 11 %   Monocytes Relative 12 %   Eosinophils Relative 2 %   Basophils Relative  0 %   Neutro Abs 4.8 1.7 - 7.7 K/uL   Lymphs Abs 0.7 0.7 - 4.0 K/uL   Monocytes Absolute 0.8 0.1 - 1.0 K/uL   Eosinophils Absolute 0.1 0.0 - 0.7 K/uL   Basophils Absolute 0.0 0.0 - 0.1 K/uL   WBC Morphology  INCREASED BANDS (>20% BANDS)   Basic metabolic panel     Status: Abnormal   Collection Time: 03/19/16  6:00 AM  Result Value Ref Range   Sodium 139 135 - 145 mmol/L   Potassium 3.4 (L) 3.5 - 5.1 mmol/L   Chloride 108 101 - 111 mmol/L   CO2 27 22 - 32 mmol/L   Glucose, Bld 132 (H) 65 - 99 mg/dL   BUN 15 6 - 20 mg/dL   Creatinine, Ser 4.09 0.61 - 1.24 mg/dL   Calcium 7.7 (L) 8.9 - 10.3 mg/dL   GFR calc non Af Amer >60 >60 mL/min   GFR calc Af Amer >60 >60 mL/min   Anion gap 4 (L) 5 - 15  Glucose, capillary     Status: Abnormal   Collection Time: 03/19/16  8:11 AM  Result Value Ref Range   Glucose-Capillary 114 (H) 65 - 99 mg/dL     Assessment/Plan:   NEURO  Altered Mental Status:  sedation   Plan: Will keep sedated for now  PULM  Atelectasis/collapse (diffuse ) Chest Wall Trauma multiple rib fractures, Lung Trauma (right, left, with contusion of lung, with pleural effusion and air leak on the right.), Pneumomediastinum and Pneumothorax (traumatic and small basilar PTX on the right, lateral PTX on the left.)   Plan: Increase the suction on the left to -40.  Diuresis  CARDIO  Sinus Tachycardia   Plan: No specific treatment  RENAL  Urine output and renal function are good, but will need to diurese the patient with some Lasix   Plan: Lasix.  Increase the suction on the left.  GI  No specific problems.   Plan: CPM.  Continue tube feedings.  ID  Respiratory GS with GNR and GPC.  Will start empiric Zosyn   Plan: Zosyn  HEME  Anemia acute blood loss anemia)   Plan: No blood needed for now.  ENDO No specific issues   Plan: CPM  Global Issues  Lasix, antibiotics, ABG, CXR tomorrow.  Dropped PEEP.    LOS: 4 days   Additional comments:I reviewed the patient's new clinical lab test results. cbc/bmet/abg and I reviewed the patients new imaging test results. cxr  Critical Care Total Time*: 30 Minutes  Hebert Dooling 03/19/2016  *Care during the described time interval was  provided by me and/or other providers on the critical care team.  I have reviewed this patient's available data, including medical history, events of note, physical examination and test results as part of my evaluation.

## 2016-03-19 NOTE — Progress Notes (Signed)
Patient ID: Luis [name], male   DOB: 1995/06/22, 20 y.o.   MRN: [id number] Patient remains sedated and intubated  Reportedly follows commands when light on sedation  T8 paraplegic plan surgical stabilization next week pending pulmonary status

## 2016-03-19 NOTE — Progress Notes (Addendum)
Pharmacy Antibiotic Note  Luis [name] is a 20 y.o. male admitted on 03/15/2016 with chest wall trauma and now possible pneumonia  Pharmacy has been consulted for Zosyn dosing. Good UOP and SCr improved - stable x3 days. WBC is within normal limits. Respiratory culture with GNR and GPC on gram stain so MD would llike empiric Zosyn for now.   Plan: Zosyn 3.375g IV q8h (4 hour infusion).  Low threshold for vancomycin if concern for pneumonia Monitor renal function, culture results, and clinical status  Addendum: Discussed with Dr. Lindie SpruceWyatt- Add Vancomycin for abundant staph in respiratory culture.  Start Vancomycin 1500 mg IV x1, then 1g IV every 8 hours.    Height: 5\' 5"  (165.1 cm) Weight: 174 lb 9.7 oz (79.2 kg) IBW/kg (Calculated) : 61.5  Temp (24hrs), Avg:101.3 F (38.5 C), Min:99 F (37.2 C), Max:103 F (39.4 C)   Recent Labs Lab 03/15/16 1955  03/15/16 2240 03/16/16 0500 03/16/16 1155 03/17/16 0445 03/17/16 0451 03/17/16 0952 03/17/16 1328 03/18/16 0359 03/18/16 1540 03/19/16 0600  WBC 28.9*  --   --  10.9*  --  8.3  --   --   --  4.1 5.1 6.4  CREATININE 1.50*  --   --  1.08  --  0.81  --   --   --  0.89  --  0.96  LATICACIDVEN  --   < > 4.43*  --  2.1*  --  1.4 1.2 0.9  --   --   --   < > = values in this interval not displayed.  Estimated Creatinine Clearance: 119.1 mL/min (by C-G formula based on SCr of 0.96 mg/dL).    No Known Allergies  Antimicrobials this admission:  Zosyn 12/23 >>  Dose adjustments this admission:   Microbiology results:  12/22 BCx:  12/22 UCx:  12/22  Sputum: GS mod GNR, GPC clusters 12/21 MRSA nasal culture - detected  Thank you for allowing pharmacy to be a part of this patient's care.  Link SnufferJessica Darrek Leasure, PharmD, BCPS Clinical Pharmacist [phone] 03/19/2016 10:39 AM

## 2016-03-19 NOTE — Progress Notes (Signed)
SPOKE WITH RN KATIE, PT'S MRI IS STILL ON HOLD FOR NOW, TOLD HER TO HAVE RN CALL ONCE PT IS DOING BETTER AND MD IS READY FOR HIM TO BE SCANNED

## 2016-03-19 NOTE — Progress Notes (Addendum)
PHARMACY - PHYSICIAN COMMUNICATION CRITICAL VALUE ALERT - BLOOD CULTURE IDENTIFICATION (BCID)  Results for orders placed or performed during the hospital encounter of 03/15/16  Blood Culture ID Panel (Reflexed) (Collected: 03/18/2016  1:35 PM)  Result Value Ref Range   Enterococcus species NOT DETECTED NOT DETECTED   Listeria monocytogenes NOT DETECTED NOT DETECTED   Staphylococcus species DETECTED (A) NOT DETECTED   Staphylococcus aureus NOT DETECTED NOT DETECTED   Methicillin resistance NOT DETECTED NOT DETECTED   Streptococcus species NOT DETECTED NOT DETECTED   Streptococcus agalactiae NOT DETECTED NOT DETECTED   Streptococcus pneumoniae NOT DETECTED NOT DETECTED   Streptococcus pyogenes NOT DETECTED NOT DETECTED   Acinetobacter baumannii NOT DETECTED NOT DETECTED   Enterobacteriaceae species NOT DETECTED NOT DETECTED   Enterobacter cloacae complex NOT DETECTED NOT DETECTED   Escherichia coli NOT DETECTED NOT DETECTED   Klebsiella oxytoca NOT DETECTED NOT DETECTED   Klebsiella pneumoniae NOT DETECTED NOT DETECTED   Proteus species NOT DETECTED NOT DETECTED   Serratia marcescens NOT DETECTED NOT DETECTED   Haemophilus influenzae NOT DETECTED NOT DETECTED   Neisseria meningitidis NOT DETECTED NOT DETECTED   Pseudomonas aeruginosa NOT DETECTED NOT DETECTED   Candida albicans NOT DETECTED NOT DETECTED   Candida glabrata NOT DETECTED NOT DETECTED   Candida krusei NOT DETECTED NOT DETECTED   Candida parapsilosis NOT DETECTED NOT DETECTED   Candida tropicalis NOT DETECTED NOT DETECTED    Name of physician (or Provider) Contacted: Dr. Lindie SpruceWyatt  Changes to prescribed antibiotics required: None - only 1/2 blood cultures, likely contaminant. Patient on Vancomycin and Zosyn currently.   Link SnufferJessica Naava Janeway, PharmD, BCPS Clinical Pharmacist [phone] 03/19/2016  1:37 PM

## 2016-03-20 ENCOUNTER — Inpatient Hospital Stay (HOSPITAL_COMMUNITY): Payer: Medicaid Other

## 2016-03-20 LAB — CBC WITH DIFFERENTIAL/PLATELET
BASOS ABS: 0 10*3/uL (ref 0.0–0.1)
Basophils Relative: 0 %
Eosinophils Absolute: 0.2 10*3/uL (ref 0.0–0.7)
Eosinophils Relative: 2 %
HEMATOCRIT: 22.8 % — AB (ref 39.0–52.0)
HEMOGLOBIN: 7.9 g/dL — AB (ref 13.0–17.0)
LYMPHS PCT: 9 %
Lymphs Abs: 0.7 10*3/uL (ref 0.7–4.0)
MCH: 30 pg (ref 26.0–34.0)
MCHC: 34.6 g/dL (ref 30.0–36.0)
MCV: 86.7 fL (ref 78.0–100.0)
MONOS PCT: 14 %
Monocytes Absolute: 1.1 10*3/uL — ABNORMAL HIGH (ref 0.1–1.0)
NEUTROS ABS: 5.7 10*3/uL (ref 1.7–7.7)
Neutrophils Relative %: 75 %
Platelets: 117 10*3/uL — ABNORMAL LOW (ref 150–400)
RBC: 2.63 MIL/uL — AB (ref 4.22–5.81)
RDW: 15.5 % (ref 11.5–15.5)
WBC Morphology: INCREASED
WBC: 7.7 10*3/uL (ref 4.0–10.5)

## 2016-03-20 LAB — BASIC METABOLIC PANEL
ANION GAP: 9 (ref 5–15)
BUN: 20 mg/dL (ref 6–20)
CHLORIDE: 108 mmol/L (ref 101–111)
CO2: 24 mmol/L (ref 22–32)
Calcium: 7.7 mg/dL — ABNORMAL LOW (ref 8.9–10.3)
Creatinine, Ser: 0.92 mg/dL (ref 0.61–1.24)
GFR calc non Af Amer: >60 mL/min (ref >60)
Glucose, Bld: 113 mg/dL — ABNORMAL HIGH (ref 65–99)
POTASSIUM: 3.2 mmol/L — AB (ref 3.5–5.1)
Sodium: 141 mmol/L (ref 135–145)

## 2016-03-20 LAB — BLOOD GAS, ARTERIAL
ACID-BASE EXCESS: 3 mmol/L — AB (ref 0.0–2.0)
BICARBONATE: 26.9 mmol/L (ref 20.0–28.0)
Drawn by: 25231
FIO2: 40
LHR: 28 {breaths}/min
MECHVT: 450 mL
O2 Saturation: 93.6 %
PATIENT TEMPERATURE: 101.5
PCO2 ART: 44.3 mmHg (ref 32.0–48.0)
PEEP/CPAP: 5 cmH2O
PO2 ART: 71.4 mmHg — AB (ref 83.0–108.0)
pH, Arterial: 7.409 (ref 7.350–7.450)

## 2016-03-20 LAB — CULTURE, RESPIRATORY W GRAM STAIN: Special Requests: NORMAL

## 2016-03-20 LAB — GLUCOSE, CAPILLARY
GLUCOSE-CAPILLARY: 87 mg/dL (ref 65–99)
GLUCOSE-CAPILLARY: 110 mg/dL — AB (ref 65–99)
Glucose-Capillary: 103 mg/dL — ABNORMAL HIGH (ref 65–99)
Glucose-Capillary: 134 mg/dL — ABNORMAL HIGH (ref 65–99)
Glucose-Capillary: 95 mg/dL (ref 65–99)
Glucose-Capillary: 140 mg/dL — ABNORMAL HIGH (ref 65–99)

## 2016-03-20 LAB — HEMOGLOBIN AND HEMATOCRIT, BLOOD
HEMATOCRIT: 25.2 % — AB (ref 39.0–52.0)
HEMOGLOBIN: 8.7 g/dL — AB (ref 13.0–17.0)

## 2016-03-20 LAB — PREPARE RBC (CROSSMATCH)

## 2016-03-20 MED ORDER — SODIUM CHLORIDE 0.9 % IV SOLN
Freq: Once | INTRAVENOUS | Status: AC
  Administered 2016-03-20: 18:00:00 via INTRAVENOUS

## 2016-03-20 MED ORDER — VECURONIUM BROMIDE 10 MG IV SOLR
10.0000 mg | Freq: Once | INTRAVENOUS | Status: AC
Start: 2016-03-20 — End: 2016-03-20
  Administered 2016-03-20: 10 mg via INTRAVENOUS
  Filled 2016-03-20: qty 10

## 2016-03-20 MED ORDER — PROPOFOL 1000 MG/100ML IV EMUL
5.0000 ug/kg/min | INTRAVENOUS | Status: DC
  Administered 2016-03-20: 5 ug/kg/min via INTRAVENOUS
  Administered 2016-03-21: 30 ug/kg/min via INTRAVENOUS
  Administered 2016-03-21: 25 ug/kg/min via INTRAVENOUS
  Administered 2016-03-21: 15 ug/kg/min via INTRAVENOUS
  Administered 2016-03-22: 35 ug/kg/min via INTRAVENOUS
  Administered 2016-03-22: 50 ug/kg/min via INTRAVENOUS
  Administered 2016-03-22: 55 ug/kg/min via INTRAVENOUS
  Administered 2016-03-22 (×2): 50 ug/kg/min via INTRAVENOUS
  Administered 2016-03-23 (×3): 60 ug/kg/min via INTRAVENOUS
  Filled 2016-03-20 (×13): qty 100

## 2016-03-20 NOTE — Progress Notes (Signed)
Upon entering pt.'s room RT found pt.'s HR to be in the upper 130's, RR ranging from 40-50 & pt. Was using accessory muscles to breathe. Pt. Was bagged & lavaged & a copious amount of pink tinged secretions were removed. RN called trauma MD & made them aware that pt. Was being bagged & lavaged & new orders were given to RN to give blood. MD was called back & informed that pt.'s condition wasn't improving & a STAT CXR was ordered. MD was called back by charge RN & a one time paralytic was ordered. RT will continue to monitor pt. & call MD back if pt.'s condition hasn't improved.

## 2016-03-20 NOTE — Progress Notes (Signed)
Trauma MD was paged back by RN to let MD know that the patient's condition hasn't improved. Waiting on return phone call.

## 2016-03-20 NOTE — Progress Notes (Signed)
Subjective: Patient reports sedated on vent  Objective: Vital signs in last 24 hours: Temp:  [100.9 F (38.3 C)-103 F (39.4 C)] 102.3 F (39.1 C) (12/24 0800) Pulse Rate:  [98-144] 122 (12/24 0800) Resp:  [21-46] 28 (12/24 0800) BP: (90-131)/(42-80) 103/47 (12/24 0800) SpO2:  [92 %-100 %] 93 % (12/24 0800) FiO2 (%):  [40 %-55 %] 40 % (12/24 0744) Weight:  [79.4 kg (175 lb 0.7 oz)] 79.4 kg (175 lb 0.7 oz) (12/24 0423)  Intake/Output from previous day: 12/23 0701 - 12/24 0700 In: 4366 [I.V.:1896; NG/GT:1420; IV Piggyback:1050] Out: 5025 [Urine:4800; Chest Tube:225] Intake/Output this shift: Total I/O In: 133 [I.V.:78; NG/GT:55] Out: 175 [Urine:175]  Physical Exam: Sedated on vent.  Lab Results:  Recent Labs  03/19/16 0600 03/20/16 0550  WBC 6.4 7.7  HGB 8.3* 7.9*  HCT 23.4* 22.8*  PLT 88* 117*   BMET  Recent Labs  03/19/16 0600 03/20/16 0550  NA 139 141  K 3.4* 3.2*  CL 108 108  CO2 27 24  GLUCOSE 132* 113*  BUN 15 20  CREATININE 0.96 0.92  CALCIUM 7.7* 7.7*    Studies/Results: Dg Chest Port 1 View  Result Date: 03/19/2016 CLINICAL DATA:  Chest trauma, ETT EXAM: PORTABLE CHEST 1 VIEW COMPARISON:  03/08/2016 FINDINGS: Endotracheal tube terminates 5.5 cm above the carina. Mild patchy opacities in the right middle lobe and left upper and lower lobes, likely reflecting aspiration versus contusion. Small left pneumothorax. Associated chest tube in the medial left upper lung. Small to moderate right pneumothorax with basilar predominance. Associated right apical chest tube. The heart is normal in size. Enteric tube courses into the stomach. IMPRESSION: Endotracheal tube terminates 5.5 cm above the carina. Patchy opacities bilaterally, likely reflecting aspiration versus contusion. Small left pneumothorax with indwelling chest tube. Small to moderate right pneumothorax with indwelling chest tube. Electronically Signed   By: Charline BillsSriyesh  Krishnan M.D.   On: 03/19/2016  08:13    Assessment/Plan: As per Dr. Conchita ParisNundkumar, will require spinal stabilization surgery when medically stable.    LOS: 5 days    Dorian HeckleSTERN,Reisa Coppola D, MD 03/20/2016, 8:53 AM

## 2016-03-20 NOTE — Progress Notes (Signed)
Follow up - Trauma and Critical Care  Patient Details:    Luis [name] is an 20 y.o. male.  Lines/tubes : Airway 7.5 mm (Active)  Secured at (cm) 26 cm 03/19/2016  8:01 AM  Measured From Lips 03/19/2016  8:01 AM  Secured Location Center 03/19/2016  8:01 AM  Secured By [employer] 03/19/2016  8:01 AM  Tube Holder Repositioned Yes 03/19/2016  8:01 AM  Cuff Pressure (cm H2O) 24 cm H2O 03/19/2016  3:50 AM  Site Condition Dry 03/19/2016  8:01 AM     CVC Triple Lumen 03/16/16 Right Subclavian (Active)  Indication for Insertion or Continuance of Line Prolonged intravenous therapies 03/19/2016  8:00 AM  Site Assessment Clean;Dry;Intact 03/19/2016  8:00 AM  Proximal Lumen Status Infusing;Flushed 03/19/2016  8:00 AM  Medial Infusing;Flushed 03/19/2016  8:00 AM  Distal Lumen Status Infusing;Flushed 03/19/2016  8:00 AM  Dressing Type Transparent;Occlusive 03/19/2016  8:00 AM  Dressing Status Clean;Dry;Intact;Antimicrobial disc in place 03/19/2016  8:00 AM  Line Care Connections checked and tightened;Zeroed and calibrated;Leveled 03/19/2016  8:00 AM  Dressing Change Due 03/23/16 03/19/2016  8:00 AM     Chest Tube Right (Active)  Suction -40 cm H2O 03/19/2016  8:30 AM  Chest Tube Air Leak Moderate 03/19/2016  8:30 AM  Patency Intervention Tip/tilt;Milked 03/19/2016  8:30 AM  Drainage Description Serosanguineous 03/19/2016  8:30 AM  Dressing Status Clean;Dry;Intact 03/19/2016  8:30 AM  Dressing Intervention New dressing 03/19/2016  8:30 AM  Site Assessment Intact 03/19/2016  8:30 AM  Surrounding Skin Intact 03/19/2016  8:30 AM  Output (mL) 90 mL 03/19/2016  6:00 AM     Chest Tube 3 Left;Anterior Pleural (Active)  Suction -40 cm H2O 03/19/2016  9:52 AM  Chest Tube Air Leak None 03/19/2016  8:30 AM  Patency Intervention Milked;Tip/tilt 03/19/2016  8:30 AM  Drainage Description Serosanguineous 03/19/2016  8:30 AM  Dressing Status Clean;Dry;Intact 03/19/2016  8:30 AM   Dressing Intervention New dressing 03/19/2016  8:30 AM  Site Assessment Dry;Clean 03/19/2016  8:30 AM  Surrounding Skin Dry 03/19/2016  8:30 AM  Output (mL) 165 mL 03/19/2016  6:00 AM     NG/OG Tube Orogastric 18 Fr. Right mouth Xray Measured external length of tube (Active)  Site Assessment Clean;Dry;Intact 03/19/2016  8:00 AM  Ongoing Placement Verification Auscultation 03/19/2016  8:00 AM  Status Infusing tube feed 03/19/2016  8:00 AM  Drainage Appearance Bloody;Brown 03/19/2016  8:00 AM  Intake (mL) 120 mL 03/18/2016 10:00 PM  Output (mL) 100 mL 03/17/2016  6:00 AM     Urethral Catheter Aborst RN  Straight-tip 16 Fr. (Active)  Indication for Insertion or Continuance of Catheter Unstable spinal/crush injuries;Unstable critical patients (first 24-48 hours) 03/19/2016  8:00 AM  Site Assessment Clean;Intact 03/19/2016  8:00 AM  Catheter Maintenance Bag below level of bladder;Catheter secured;Drainage bag/tubing not touching floor;Insertion date on drainage bag;No dependent loops;Seal intact 03/19/2016  8:00 AM  Collection Container Standard drainage bag 03/19/2016  8:00 AM  Securement Method Securing device (Describe) 03/19/2016  8:00 AM  Urinary Catheter Interventions Unclamped 03/19/2016  8:00 AM  Output (mL) 100 mL 03/19/2016  6:00 AM    Microbiology/Sepsis markers: Results for orders placed or performed during the hospital encounter of 03/15/16  MRSA culture     Status: None   Collection Time: 03/17/16  1:39 PM  Result Value Ref Range Status   Specimen Description NASOPHARYNGEAL  Final   Special Requests NONE  Final   Culture MRSA DETECTED  Final  Report Status 03/19/2016 FINAL  Final  Culture, respiratory (NON-Expectorated)     Status: None (Preliminary result)   Collection Time: 03/18/16 11:12 AM  Result Value Ref Range Status   Specimen Description TRACHEAL ASPIRATE  Final   Special Requests Normal  Final   Gram Stain   Final    ABUNDANT WBC PRESENT, PREDOMINANTLY  PMN RARE SQUAMOUS EPITHELIAL CELLS PRESENT ABUNDANT GRAM POSITIVE COCCI IN PAIRS IN CLUSTERS MODERATE GRAM NEGATIVE RODS    Culture ABUNDANT STAPHYLOCOCCUS AUREUS  Final   Report Status PENDING  Incomplete  Culture, Urine     Status: None   Collection Time: 03/18/16  1:19 PM  Result Value Ref Range Status   Specimen Description URINE, CATHETERIZED  Final   Special Requests Normal  Final   Culture NO GROWTH  Final   Report Status 03/19/2016 FINAL  Final  Culture, blood (Routine X 2) w Reflex to ID Panel     Status: None (Preliminary result)   Collection Time: 03/18/16  1:35 PM  Result Value Ref Range Status   Specimen Description BLOOD LEFT HAND  Final   Special Requests BOTTLES DRAWN AEROBIC ONLY 8CC  Final   Culture  Setup Time   Final    GRAM POSITIVE COCCI IN CLUSTERS AEROBIC BOTTLE ONLY CRITICAL RESULT CALLED TO, READ BACK BY AND VERIFIED WITH: J MILLEN,PHARMD AT 1334 03/19/16 BY L BENFIELD    Culture GRAM POSITIVE COCCI  Final   Report Status PENDING  Incomplete  Blood Culture ID Panel (Reflexed)     Status: Abnormal   Collection Time: 03/18/16  1:35 PM  Result Value Ref Range Status   Enterococcus species NOT DETECTED NOT DETECTED Final   Listeria monocytogenes NOT DETECTED NOT DETECTED Final   Staphylococcus species DETECTED (A) NOT DETECTED Final    Comment: CRITICAL RESULT CALLED TO, READ BACK BY AND VERIFIED WITH: J MILLEN,PHARMD AT 1334 03/19/16 BY L BENFIELD    Staphylococcus aureus NOT DETECTED NOT DETECTED Final   Methicillin resistance NOT DETECTED NOT DETECTED Final   Streptococcus species NOT DETECTED NOT DETECTED Final   Streptococcus agalactiae NOT DETECTED NOT DETECTED Final   Streptococcus pneumoniae NOT DETECTED NOT DETECTED Final   Streptococcus pyogenes NOT DETECTED NOT DETECTED Final   Acinetobacter baumannii NOT DETECTED NOT DETECTED Final   Enterobacteriaceae species NOT DETECTED NOT DETECTED Final   Enterobacter cloacae complex NOT DETECTED NOT  DETECTED Final   Escherichia coli NOT DETECTED NOT DETECTED Final   Klebsiella oxytoca NOT DETECTED NOT DETECTED Final   Klebsiella pneumoniae NOT DETECTED NOT DETECTED Final   Proteus species NOT DETECTED NOT DETECTED Final   Serratia marcescens NOT DETECTED NOT DETECTED Final   Haemophilus influenzae NOT DETECTED NOT DETECTED Final   Neisseria meningitidis NOT DETECTED NOT DETECTED Final   Pseudomonas aeruginosa NOT DETECTED NOT DETECTED Final   Candida albicans NOT DETECTED NOT DETECTED Final   Candida glabrata NOT DETECTED NOT DETECTED Final   Candida krusei NOT DETECTED NOT DETECTED Final   Candida parapsilosis NOT DETECTED NOT DETECTED Final   Candida tropicalis NOT DETECTED NOT DETECTED Final  Culture, blood (Routine X 2) w Reflex to ID Panel     Status: None (Preliminary result)   Collection Time: 03/18/16  3:17 PM  Result Value Ref Range Status   Specimen Description BLOOD LEFT ARM  Final   Special Requests BOTTLES DRAWN AEROBIC AND ANAEROBIC 5CC  Final   Culture NO GROWTH 2 DAYS  Final   Report Status PENDING  Incomplete  MRSA PCR Screening     Status: None   Collection Time: 03/19/16  1:52 PM  Result Value Ref Range Status   MRSA by PCR NEGATIVE NEGATIVE Final    Comment:        The GeneXpert MRSA Assay (FDA approved for NASAL specimens only), is one component of a comprehensive MRSA colonization surveillance program. It is not intended to diagnose MRSA infection nor to guide or monitor treatment for MRSA infections.     Anti-infectives:  Anti-infectives    Start     Dose/Rate Route Frequency Ordered Stop   03/19/16 2200  vancomycin (VANCOCIN) IVPB 1000 mg/200 mL premix     1,000 mg 200 mL/hr over 60 Minutes Intravenous Every 8 hours 03/19/16 1355     03/19/16 1400  piperacillin-tazobactam (ZOSYN) IVPB 3.375 g     3.375 g 12.5 mL/hr over 240 Minutes Intravenous Every 8 hours 03/19/16 1041     03/19/16 1400  vancomycin (VANCOCIN) 1,500 mg in sodium chloride  0.9 % 500 mL IVPB     1,500 mg 250 mL/hr over 120 Minutes Intravenous  Once 03/19/16 1355 03/19/16 1638      Best Practice/Protocols:  VTE Prophylaxis: Lovenox (prophylaxtic dose) GI Prophylaxis: Proton Pump Inhibitor Continous Sedation  Consults: Treatment Team:  Lisbeth Renshaw, MD Yolonda Kida, MD    Events:  Subjective:    Overnight Issues: Continues to have fevers.    Objective:  Vital signs for last 24 hours: Temp:  [100.9 F (38.3 C)-103 F (39.4 C)] 102.3 F (39.1 C) (12/24 0800) Pulse Rate:  [98-144] 120 (12/24 0900) Resp:  [21-46] 32 (12/24 0900) BP: (90-131)/(42-80) 102/45 (12/24 0900) SpO2:  [92 %-100 %] 94 % (12/24 0900) FiO2 (%):  [40 %-50 %] 40 % (12/24 0744) Weight:  [79.4 kg (175 lb 0.7 oz)] 79.4 kg (175 lb 0.7 oz) (12/24 0423)  Hemodynamic parameters for last 24 hours: CVP:  [9 mmHg-11 mmHg] 9 mmHg  Intake/Output from previous day: 12/23 0701 - 12/24 0700 In: 4366 [I.V.:1896; NG/GT:1420; IV Piggyback:1050] Out: 5025 [Urine:4800; Chest Tube:225]  Intake/Output this shift: Total I/O In: 266 [I.V.:156; NG/GT:110] Out: 275 [Urine:275]  Vent settings for last 24 hours: Vent Mode: PRVC FiO2 (%):  [40 %-50 %] 40 % Set Rate:  [28 bmp] 28 bmp Vt Set:  [450 mL-500 mL] 450 mL PEEP:  [5 cmH20-8 cmH20] 5 cmH20 Plateau Pressure:  [15 cmH20-21 cmH20] 19 cmH20  Physical Exam:  General: sedated, on low volume/high rate ventilation.   Neuro: nonfocal exam, weakness right lower extremity and weakness left lower extremity.  Moving both upper extremities.   Resp: coarse BS bilaterally.   CVS: regular rhythm, no murmurs. sinus tachycardia GI: soft, nontender, BS WNL, no r/g and tolerating tube feedings well Extremities: tr edema, no erythema, pulses WNL  Results for orders placed or performed during the hospital encounter of 03/15/16 (from the past 24 hour(s))  Blood gas, arterial     Status: None   Collection Time: 03/19/16 11:00 AM  Result  Value Ref Range   FIO2 50.00    Delivery systems VENTILATOR    Mode PRESSURE REGULATED VOLUME CONTROL    VT 500.0 mL   LHR 28.0 resp/min   Peep/cpap 8.0 cm H20   pH, Arterial 7.376 7.350 - 7.450   pCO2 arterial 46.2 32.0 - 48.0 mmHg   pO2, Arterial 85.1 83.0 - 108.0 mmHg   Bicarbonate 26.2 20.0 - 28.0 mmol/L   Acid-Base Excess 1.7 0.0 -  2.0 mmol/L   O2 Saturation 96.5 %   Patient temperature 99.7    Collection site LEFT RADIAL    Drawn by [phone]    Sample type ARTERIAL DRAW    Allens test (pass/fail) PASS PASS  Glucose, capillary     Status: Abnormal   Collection Time: 03/19/16 12:43 PM  Result Value Ref Range   Glucose-Capillary 140 (H) 65 - 99 mg/dL  MRSA PCR Screening     Status: None   Collection Time: 03/19/16  1:52 PM  Result Value Ref Range   MRSA by PCR NEGATIVE NEGATIVE  Glucose, capillary     Status: Abnormal   Collection Time: 03/19/16  4:20 PM  Result Value Ref Range   Glucose-Capillary 114 (H) 65 - 99 mg/dL  Glucose, capillary     Status: Abnormal   Collection Time: 03/19/16  7:18 PM  Result Value Ref Range   Glucose-Capillary 111 (H) 65 - 99 mg/dL  Glucose, capillary     Status: Abnormal   Collection Time: 03/19/16 11:43 PM  Result Value Ref Range   Glucose-Capillary 127 (H) 65 - 99 mg/dL  Glucose, capillary     Status: Abnormal   Collection Time: 03/20/16  4:08 AM  Result Value Ref Range   Glucose-Capillary 103 (H) 65 - 99 mg/dL  Blood gas, arterial     Status: Abnormal   Collection Time: 03/20/16  4:17 AM  Result Value Ref Range   FIO2 40.00    Delivery systems VENTILATOR    Mode PRESSURE REGULATED VOLUME CONTROL    VT 450 mL   LHR 28 resp/min   Peep/cpap 5.0 cm H20   pH, Arterial 7.409 7.350 - 7.450   pCO2 arterial 44.3 32.0 - 48.0 mmHg   pO2, Arterial 71.4 (L) 83.0 - 108.0 mmHg   Bicarbonate 26.9 20.0 - 28.0 mmol/L   Acid-Base Excess 3.0 (H) 0.0 - 2.0 mmol/L   O2 Saturation 93.6 %   Patient temperature 101.5    Collection site RIGHT RADIAL     Drawn by [phone]    Sample type ARTERIAL DRAW    Allens test (pass/fail) PASS PASS  CBC with Differential/Platelet     Status: Abnormal   Collection Time: 03/20/16  5:50 AM  Result Value Ref Range   WBC 7.7 4.0 - 10.5 K/uL   RBC 2.63 (L) 4.22 - 5.81 MIL/uL   Hemoglobin 7.9 (L) 13.0 - 17.0 g/dL   HCT 29.522.8 (L) 62.139.0 - 30.852.0 %   MCV 86.7 78.0 - 100.0 fL   MCH 30.0 26.0 - 34.0 pg   MCHC 34.6 30.0 - 36.0 g/dL   RDW 65.715.5 84.611.5 - 96.215.5 %   Platelets 117 (L) 150 - 400 K/uL   Neutrophils Relative % 75 %   Lymphocytes Relative 9 %   Monocytes Relative 14 %   Eosinophils Relative 2 %   Basophils Relative 0 %   Neutro Abs 5.7 1.7 - 7.7 K/uL   Lymphs Abs 0.7 0.7 - 4.0 K/uL   Monocytes Absolute 1.1 (H) 0.1 - 1.0 K/uL   Eosinophils Absolute 0.2 0.0 - 0.7 K/uL   Basophils Absolute 0.0 0.0 - 0.1 K/uL   WBC Morphology INCREASED BANDS (>20% BANDS)   Basic metabolic panel     Status: Abnormal   Collection Time: 03/20/16  5:50 AM  Result Value Ref Range   Sodium 141 135 - 145 mmol/L   Potassium 3.2 (L) 3.5 - 5.1 mmol/L   Chloride 108 101 - 111 mmol/L  CO2 24 22 - 32 mmol/L   Glucose, Bld 113 (H) 65 - 99 mg/dL   BUN 20 6 - 20 mg/dL   Creatinine, Ser 1.61 0.61 - 1.24 mg/dL   Calcium 7.7 (L) 8.9 - 10.3 mg/dL   GFR calc non Af Amer >60 >60 mL/min   GFR calc Af Amer >60 >60 mL/min   Anion gap 9 5 - 15  Glucose, capillary     Status: Abnormal   Collection Time: 03/20/16  8:06 AM  Result Value Ref Range   Glucose-Capillary 134 (H) 65 - 99 mg/dL     Assessment/Plan:   NEURO  Altered Mental Status:  sedation   Plan: Will keep sedated for now. Weak lower extremities.  Needs spine MRI, but still on significatn sedation.  PULM  Atelectasis/collapse (diffuse ) Chest Wall Trauma multiple rib fractures, Lung Trauma (right, left, with contusion of lung, with pleural effusion and air leak on the right.), Pneumomediastinum and Pneumothorax (traumatic and small basilar PTX on the right, lateral PTX on  the left.)   Plan: Continue CT to suction bilaterally.  CXR with resolution of left PTX, persistent right PTX.  Increased lower lung consolidation.  Improved vent settings.  Tolerating decreased peep to 5 and FiO2 40%.  CARDIO  Sinus Tachycardia   Plan: No specific treatment.  RENAL  Urine output and renal function are good, but will need to diurese the patient with some Lasix.  Hypokalemia.   Plan: lasix.  Replete potassium.  GI  No specific problems.   Plan: CPM.  Continue tube feedings.  ID  Respiratory GS with GNR and GPC.  Will start empiric Zosyn   Plan: Zosyn/vanc.  No growth on blood or urine cx, but staph aureus on respiratory cultures.  CXR worse.  Add cooling blanket.    HEME  Anemia acute blood loss anemia)   Plan: Still with some downward drift.  No blood at this point.    ENDO No specific issues   Plan: CPM  Global Issues  Working on pneumonia.  Suction as needed.   Would not repeat cultures at this point.   May end up needing packed cells as tachycardia remains and hgb continues to drift down.      LOS: 5 days   Additional comments:I reviewed the patient's new clinical lab test results. cbc/bmet/abg and I reviewed the patients new imaging test results. cxr  Critical Care Total Time*: 30 Minutes  Pericles Carmicheal 03/20/2016  *Care during the described time interval was provided by me and/or other providers on the critical care team.  I have reviewed this patient's available data, including medical history, events of note, physical examination and test results as part of my evaluation.

## 2016-03-20 NOTE — Progress Notes (Addendum)
Patient with tachypnea (40's), tachycardia (130's sustaining). Respiratory at bedside for bag and lavage. MD paged and orders for blood to be given. Waiting for orders. Monitoring closely.Kaislee Chao, [place] ScrapeSarah E, RN   MD paged again for update on status, One time order of Vec 10mg  to be given. CXR complete. Waiting on blood order.

## 2016-03-20 NOTE — Progress Notes (Signed)
eLink Physician-Brief Progress Note Patient Name: [name] DOB: [birth year] MRN: [id number]   Date of Service  03/20/2016  HPI/Events of Note  Asked by Trauma team to help with vent overnight Pt tachypnea, tachycardic CXR reviewed- no acute changes to B/L opacities. B?L chest tubes in place with small rt pneumothorax Discussed with on call MD who examined the pt at bedside.  eICU Interventions  Increase sedation, start propofol Check ABG, Follow CXR to monitor pneumothorax        Ennio Houp 03/20/2016, 7:57 PM

## 2016-03-21 ENCOUNTER — Inpatient Hospital Stay (HOSPITAL_COMMUNITY): Payer: Medicaid Other

## 2016-03-21 LAB — CULTURE, BLOOD (ROUTINE X 2)

## 2016-03-21 LAB — GLUCOSE, CAPILLARY
GLUCOSE-CAPILLARY: 115 mg/dL — AB (ref 65–99)
GLUCOSE-CAPILLARY: 108 mg/dL — AB (ref 65–99)
Glucose-Capillary: 115 mg/dL — ABNORMAL HIGH (ref 65–99)
Glucose-Capillary: 115 mg/dL — ABNORMAL HIGH (ref 65–99)
Glucose-Capillary: 114 mg/dL — ABNORMAL HIGH (ref 65–99)
Glucose-Capillary: 122 mg/dL — ABNORMAL HIGH (ref 65–99)

## 2016-03-21 LAB — TRIGLYCERIDES: Triglycerides: 130 mg/dL (ref <150)

## 2016-03-21 MED ORDER — SODIUM CHLORIDE 0.9 % IV SOLN
3.0000 g | Freq: Four times a day (QID) | INTRAVENOUS | Status: AC
  Administered 2016-03-21 – 2016-03-25 (×16): 3 g via INTRAVENOUS
  Filled 2016-03-21 (×17): qty 3

## 2016-03-21 NOTE — Procedures (Signed)
Bronchoscopy Procedure Note Luis [name] [id number] 12/25/1995  Procedure: Bronchoscopy Indications: Remove secretions  Procedure Details Consent: Unable to obtain consent because of emergent medical necessity. Time Out: Verified patient identification, verified procedure, site/side was marked, verified correct patient position, special equipment/implants available, medications/allergies/relevent history reviewed, required imaging and test results available.  Performed  In preparation for procedure, patient was given 100% FiO2 and bronchoscope lubricated. Sedation: fentanyl and propofol bolus  Airway entered and the following bronchi were examined: RUL, RML, LUL, LLL and Bronchi.   Procedures performed: Brushings performed Bronchoscope removed.  , Placed back on FIO2 40%  Large clots seen in the upper airways on the left.  Evaluation Hemodynamic Status: BP stable throughout; O2 sats: stable throughout and currently acceptable Patient's Current Condition: stable Specimens:  None Complications: No apparent complications Patient did tolerate procedure well.   Luis [name] 03/21/2016

## 2016-03-21 NOTE — Progress Notes (Signed)
25 ml of Versed gtt wasted in sink, witnessed by Jonelle Sidlearla Godfrey, RN.
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
## 2016-03-21 NOTE — Progress Notes (Signed)
At 11:45 pt breathing in 40s, disynchronous with vent, not much chest expansion. Sedation gradually increased to 400 fentanyl and 40 propofol. Vital signs and respirations look much better. Pt now not following commands due to increase in sedation. Dr. Lindie SpruceWyatt notified. He said priority was vital sign and respiratory stabilization. Wrist restraints d/c'd and mittens placed bilaterally.
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
## 2016-03-22 ENCOUNTER — Inpatient Hospital Stay (HOSPITAL_COMMUNITY): Payer: Medicaid Other

## 2016-03-22 LAB — TYPE AND SCREEN
ABO/RH(D): O POS
ANTIBODY SCREEN: NEGATIVE
UNIT DIVISION: 0

## 2016-03-22 LAB — GLUCOSE, CAPILLARY
GLUCOSE-CAPILLARY: 108 mg/dL — AB (ref 65–99)
Glucose-Capillary: 117 mg/dL — ABNORMAL HIGH (ref 65–99)
Glucose-Capillary: 98 mg/dL (ref 65–99)
Glucose-Capillary: 124 mg/dL — ABNORMAL HIGH (ref 65–99)
Glucose-Capillary: 97 mg/dL (ref 65–99)

## 2016-03-22 LAB — CBC
HCT: 25 % — ABNORMAL LOW (ref 39.0–52.0)
HEMOGLOBIN: 8.4 g/dL — AB (ref 13.0–17.0)
MCH: 28.9 pg (ref 26.0–34.0)
MCHC: 33.6 g/dL (ref 30.0–36.0)
MCV: 85.9 fL (ref 78.0–100.0)
PLATELETS: 216 10*3/uL (ref 150–400)
RBC: 2.91 MIL/uL — AB (ref 4.22–5.81)
RDW: 16.6 % — ABNORMAL HIGH (ref 11.5–15.5)
WBC: 11.3 10*3/uL — ABNORMAL HIGH (ref 4.0–10.5)

## 2016-03-22 LAB — BLOOD GAS, ARTERIAL
ACID-BASE EXCESS: 3 mmol/L — AB (ref 0.0–2.0)
Bicarbonate: 27.3 mmol/L (ref 20.0–28.0)
Drawn by: 24513
FIO2: 40
MECHVT: 450 mL
O2 SAT: 94.8 %
PEEP/CPAP: 5 cmH2O
PO2 ART: 73.3 mmHg — AB (ref 83.0–108.0)
Patient temperature: 99.6
RATE: 28 resp/min
pCO2 arterial: 44.5 mmHg (ref 32.0–48.0)
pH, Arterial: 7.407 (ref 7.350–7.450)

## 2016-03-22 LAB — BASIC METABOLIC PANEL
Anion gap: 8 (ref 5–15)
BUN: 25 mg/dL — AB (ref 6–20)
CHLORIDE: 109 mmol/L (ref 101–111)
CO2: 27 mmol/L (ref 22–32)
CREATININE: 0.77 mg/dL (ref 0.61–1.24)
Calcium: 8.1 mg/dL — ABNORMAL LOW (ref 8.9–10.3)
GFR calc Af Amer: >60 mL/min (ref >60)
GFR calc non Af Amer: >60 mL/min (ref >60)
GLUCOSE: 102 mg/dL — AB (ref 65–99)
POTASSIUM: 3.5 mmol/L (ref 3.5–5.1)
SODIUM: 144 mmol/L (ref 135–145)

## 2016-03-22 MED ORDER — CHLORHEXIDINE GLUCONATE 0.12 % MT SOLN
OROMUCOSAL | Status: AC
  Administered 2016-03-22: 15 mL via OROMUCOSAL
  Filled 2016-03-22: qty 15

## 2016-03-22 MED ORDER — PANTOPRAZOLE SODIUM 40 MG PO PACK
40.0000 mg | PACK | Freq: Every day | ORAL | Status: DC
  Administered 2016-03-22 – 2016-04-19 (×28): 40 mg
  Filled 2016-03-22 (×30): qty 20

## 2016-03-22 MED ORDER — BACITRACIN ZINC 500 UNIT/GM EX OINT
TOPICAL_OINTMENT | Freq: Two times a day (BID) | CUTANEOUS | Status: DC
  Administered 2016-03-22: 21:00:00 via TOPICAL
  Administered 2016-03-22: 1 via TOPICAL
  Administered 2016-03-23 – 2016-03-26 (×7): via TOPICAL
  Administered 2016-03-26: 1 via TOPICAL
  Administered 2016-03-27 – 2016-03-28 (×3): via TOPICAL
  Administered 2016-03-29: 1 via TOPICAL
  Administered 2016-03-29 – 2016-03-31 (×4): via TOPICAL
  Filled 2016-03-22 (×2): qty 28.35
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
## 2016-03-22 NOTE — Progress Notes (Signed)
Patient ID: Luis [name], male   DOB: 07/22/1995, 20 y.o.   MRN: [id number] Sedated, on ventilator. No obvious drainage from ears. Continue as per trauma

## 2016-03-22 NOTE — Procedures (Signed)
Bronchoscopy Procedure Note Luis [name] [id number] [birth year]  Procedure: Bronchoscopy Indications: Remove secretions  Procedure Details  Sedation: propofol, fentanyl, versed  Airway entered and the following bronchi were examined: LUL and LLL.   Procedures performed: aspiration of clot and mucus from left main upper and lower bronchi Bronchoscope removed. Patient placed back on 100% FiO2 at conclusion of procedure.    Evaluation Hemodynamic Status: BP stable throughout; O2 sats: stable throughout Patient's Current Condition: stable Specimens:  None Complications: No apparent complications Patient did tolerate procedure well.   [name] A Fredricka BonineConnor 03/22/2016

## 2016-03-22 NOTE — Progress Notes (Signed)
   Subjective:    Patient ID: Luis [name], male    DOB: 07-04-1995, 20 y.o.   MRN: [id number]  HPI Continues on ventilator.  Has not been stable enough for MRI of back.    Review of Systems     Objective:   Physical Exam Facial lacerations healing with some crusting.       Assessment & Plan:  Facial lacerations, left frontal sinus fracture, left orbital floor fracture  Removed sutures.  Wound care.  Tentatively planning to repair frontal sinus next Wednesday.

## 2016-03-23 ENCOUNTER — Inpatient Hospital Stay (HOSPITAL_COMMUNITY): Payer: Medicaid Other

## 2016-03-23 LAB — BASIC METABOLIC PANEL
Anion gap: 9 (ref 5–15)
BUN: 25 mg/dL — AB (ref 6–20)
CALCIUM: 8 mg/dL — AB (ref 8.9–10.3)
CHLORIDE: 112 mmol/L — AB (ref 101–111)
CO2: 25 mmol/L (ref 22–32)
CREATININE: 0.72 mg/dL (ref 0.61–1.24)
GFR calc non Af Amer: >60 mL/min (ref >60)
Glucose, Bld: 171 mg/dL — ABNORMAL HIGH (ref 65–99)
Potassium: 3.5 mmol/L (ref 3.5–5.1)
Sodium: 146 mmol/L — ABNORMAL HIGH (ref 135–145)

## 2016-03-23 LAB — BLOOD GAS, ARTERIAL
ACID-BASE EXCESS: 6.5 mmol/L — AB (ref 0.0–2.0)
Bicarbonate: 30.1 mmol/L — ABNORMAL HIGH (ref 20.0–28.0)
DRAWN BY: 406621
FIO2: 40
MECHVT: 550 mL
O2 SAT: 98 %
PEEP/CPAP: 8 cmH2O
PH ART: 7.467 — AB (ref 7.350–7.450)
PO2 ART: 104 mmHg (ref 83.0–108.0)
Patient temperature: 101
RATE: 20 resp/min
pCO2 arterial: 42.9 mmHg (ref 32.0–48.0)

## 2016-03-23 LAB — CBC WITH DIFFERENTIAL/PLATELET
BASOS ABS: 0 10*3/uL (ref 0.0–0.1)
Basophils Relative: 0 %
EOS ABS: 0.2 10*3/uL (ref 0.0–0.7)
EOS PCT: 2 %
HCT: 24.4 % — ABNORMAL LOW (ref 39.0–52.0)
Hemoglobin: 8 g/dL — ABNORMAL LOW (ref 13.0–17.0)
LYMPHS ABS: 1.5 10*3/uL (ref 0.7–4.0)
Lymphocytes Relative: 13 %
MCH: 29.1 pg (ref 26.0–34.0)
MCHC: 32.8 g/dL (ref 30.0–36.0)
MCV: 88.7 fL (ref 78.0–100.0)
MONO ABS: 0.9 10*3/uL (ref 0.1–1.0)
Monocytes Relative: 8 %
NEUTROS PCT: 77 %
Neutro Abs: 9.2 10*3/uL — ABNORMAL HIGH (ref 1.7–7.7)
PLATELETS: 260 10*3/uL (ref 150–400)
RBC: 2.75 MIL/uL — AB (ref 4.22–5.81)
RDW: 16.6 % — AB (ref 11.5–15.5)
WBC: 11.8 10*3/uL — AB (ref 4.0–10.5)

## 2016-03-23 LAB — CBC
HEMATOCRIT: 36.1 % — AB (ref 39.0–52.0)
HEMOGLOBIN: 11.4 g/dL — AB (ref 13.0–17.0)
MCH: 28.8 pg (ref 26.0–34.0)
MCHC: 31.6 g/dL (ref 30.0–36.0)
MCV: 91.2 fL (ref 78.0–100.0)
Platelets: 174 10*3/uL (ref 150–400)
RBC: 3.96 MIL/uL — ABNORMAL LOW (ref 4.22–5.81)
RDW: 17 % — AB (ref 11.5–15.5)
WBC: 7 10*3/uL (ref 4.0–10.5)

## 2016-03-23 LAB — GLUCOSE, CAPILLARY
GLUCOSE-CAPILLARY: 117 mg/dL — AB (ref 65–99)
GLUCOSE-CAPILLARY: 97 mg/dL (ref 65–99)
GLUCOSE-CAPILLARY: 121 mg/dL — AB (ref 65–99)
GLUCOSE-CAPILLARY: 94 mg/dL (ref 65–99)
Glucose-Capillary: 112 mg/dL — ABNORMAL HIGH (ref 65–99)
Glucose-Capillary: 97 mg/dL (ref 65–99)

## 2016-03-23 LAB — CULTURE, BLOOD (ROUTINE X 2): CULTURE: NO GROWTH

## 2016-03-23 MED ORDER — FUROSEMIDE 10 MG/ML IJ SOLN
40.0000 mg | Freq: Once | INTRAMUSCULAR | Status: AC
  Administered 2016-03-23: 40 mg via INTRAVENOUS
  Filled 2016-03-23: qty 4

## 2016-03-23 MED ORDER — PROPOFOL 1000 MG/100ML IV EMUL
5.0000 ug/kg/min | INTRAVENOUS | Status: DC
  Administered 2016-03-23 (×2): 60 ug/kg/min via INTRAVENOUS
  Administered 2016-03-23: 70 ug/kg/min via INTRAVENOUS
  Administered 2016-03-23 – 2016-03-24 (×9): 80 ug/kg/min via INTRAVENOUS
  Administered 2016-03-25: 60 ug/kg/min via INTRAVENOUS
  Administered 2016-03-25 (×3): 80 ug/kg/min via INTRAVENOUS
  Administered 2016-03-25: 70 ug/kg/min via INTRAVENOUS
  Administered 2016-03-25: 60 ug/kg/min via INTRAVENOUS
  Administered 2016-03-25 – 2016-03-26 (×3): 70 ug/kg/min via INTRAVENOUS
  Administered 2016-03-26: 55 ug/kg/min via INTRAVENOUS
  Administered 2016-03-26: 70 ug/kg/min via INTRAVENOUS
  Administered 2016-03-26: 80 ug/kg/min via INTRAVENOUS
  Administered 2016-03-26: 65 ug/kg/min via INTRAVENOUS
  Administered 2016-03-26: 70 ug/kg/min via INTRAVENOUS
  Administered 2016-03-26: 60 ug/kg/min via INTRAVENOUS
  Administered 2016-03-27 (×7): 70 ug/kg/min via INTRAVENOUS
  Administered 2016-03-27: 60 ug/kg/min via INTRAVENOUS
  Administered 2016-03-28 (×2): 70 ug/kg/min via INTRAVENOUS
  Administered 2016-03-28: 60 ug/kg/min via INTRAVENOUS
  Administered 2016-03-28 (×5): 70 ug/kg/min via INTRAVENOUS
  Administered 2016-03-29: 60 ug/kg/min via INTRAVENOUS
  Administered 2016-03-29: 75 ug/kg/min via INTRAVENOUS
  Administered 2016-03-29: 65 ug/kg/min via INTRAVENOUS
  Administered 2016-03-29: 50 ug/kg/min via INTRAVENOUS
  Administered 2016-03-29: 60 ug/kg/min via INTRAVENOUS
  Administered 2016-03-29: 70 ug/kg/min via INTRAVENOUS
  Administered 2016-03-29 – 2016-03-30 (×8): 60 ug/kg/min via INTRAVENOUS
  Administered 2016-03-31: 50 ug/kg/min via INTRAVENOUS
  Administered 2016-03-31 (×5): 60 ug/kg/min via INTRAVENOUS
  Administered 2016-03-31: 50 ug/kg/min via INTRAVENOUS
  Administered 2016-04-01 (×2): 60 ug/kg/min via INTRAVENOUS
  Administered 2016-04-01: 30 ug/kg/min via INTRAVENOUS
  Administered 2016-04-01: 60 ug/kg/min via INTRAVENOUS
  Administered 2016-04-01: 40 ug/kg/min via INTRAVENOUS
  Administered 2016-04-01 (×2): 60 ug/kg/min via INTRAVENOUS
  Administered 2016-04-02: 30 ug/kg/min via INTRAVENOUS
  Filled 2016-03-23 (×7): qty 100
  Filled 2016-03-23 (×2): qty 200
  Filled 2016-03-23 (×4): qty 100
  Filled 2016-03-23: qty 200
  Filled 2016-03-23 (×11): qty 100
  Filled 2016-03-23: qty 200
  Filled 2016-03-23 (×8): qty 100
  Filled 2016-03-23: qty 1400
  Filled 2016-03-23 (×7): qty 100
  Filled 2016-03-23 (×2): qty 200
  Filled 2016-03-23 (×3): qty 100
  Filled 2016-03-23: qty 200
  Filled 2016-03-23 (×9): qty 100
  Filled 2016-03-23: qty 200
  Filled 2016-03-23 (×9): qty 100

## 2016-03-23 MED ORDER — DEXMEDETOMIDINE HCL IN NACL 200 MCG/50ML IV SOLN
0.0000 ug/kg/h | INTRAVENOUS | Status: DC
  Administered 2016-03-23: 0.8 ug/kg/h via INTRAVENOUS
  Administered 2016-03-23: 1.2 ug/kg/h via INTRAVENOUS
  Filled 2016-03-23 (×2): qty 50

## 2016-03-23 NOTE — Progress Notes (Signed)
RT and RN attempting to get MRI scheduled for pt during day shift as it is easier for dayshift RT to take pt due to extra available staff. RN to let RT know when MRI available. RT will continue to monitor.

## 2016-03-23 NOTE — Progress Notes (Signed)
Notified MRI of pt needing to attempt to get MRI today if possible.  At this moment, pt is still very restless and agitated.  Recently switched pt back on propofol gtt for agitation.  MRI states, "have a few stat patients in line at this time, will f/u with you later in shift."

## 2016-03-23 NOTE — Progress Notes (Signed)
Follow up - Trauma and Critical Care  Patient Details:    Luis [name] is an 20 y.o. male.  Lines/tubes : Airway 7.5 mm (Active)  Secured at (cm) 22 cm 03/23/2016  7:35 AM  Measured From Lips 03/23/2016  7:35 AM  Secured Location Left 03/23/2016  7:35 AM  Secured By [employer] T[employer] 03/23/2016  7:35 AM  Tube Holder Repositioned Yes 03/23/2016  7:35 AM  Cuff Pressure (cm H2O) 26 cm H2O 03/23/2016  3:15 AM  Site Condition Dry 03/23/2016  7:35 AM     CVC Triple Lumen 03/16/16 Right Subclavian (Active)  Indication for Insertion or Continuance of Line Prolonged intravenous therapies 03/23/2016  8:00 AM  Site Assessment Clean;Dry;Intact 03/23/2016  8:00 AM  Proximal Lumen Status Infusing 03/23/2016  8:00 AM  Medial Infusing 03/23/2016  8:00 AM  Distal Lumen Status Infusing 03/23/2016  8:00 AM  Dressing Type Transparent 03/23/2016  8:00 AM  Dressing Status Dry;Intact;Antimicrobial disc in place;Old drainage 03/23/2016  8:00 AM  Line Care Connections checked and tightened 03/23/2016  8:00 AM  Dressing Intervention Dressing changed 03/22/2016  5:00 PM  Dressing Change Due 03/29/16 03/23/2016  8:00 AM     Chest Tube Right (Active)  Suction -30 cm H2O 03/22/2016  8:00 PM  Chest Tube Air Leak None 03/22/2016  8:00 PM  Patency Intervention Tip/tilt 03/22/2016  8:00 AM  Drainage Description Serosanguineous 03/22/2016  8:00 PM  Dressing Status Clean;Dry;Intact 03/22/2016  8:00 PM  Dressing Intervention Dressing changed 03/22/2016  6:00 PM  Site Assessment Clean;Dry;Intact 03/22/2016  6:00 PM  Surrounding Skin Unable to view 03/22/2016  8:00 PM  Output (mL) 0 mL 03/23/2016  6:00 AM     Chest Tube 3 Left;Anterior Pleural (Active)  Suction -20 cm H2O 03/22/2016  8:00 PM  Chest Tube Air Leak None 03/22/2016  8:00 PM  Patency Intervention Tip/tilt 03/22/2016  8:00 AM  Drainage Description Serosanguineous 03/22/2016  8:00 PM  Dressing Status Clean;Dry;Intact 03/22/2016  8:00  PM  Dressing Intervention New dressing 03/19/2016  8:30 AM  Site Assessment Clean;Dry;Intact 03/22/2016  8:00 AM  Surrounding Skin Unable to view 03/22/2016  8:00 PM  Output (mL) 5 mL 03/23/2016  6:00 AM     NG/OG Tube Orogastric 18 Fr. Right mouth Xray Measured external length of tube (Active)  Site Assessment Clean;Intact 03/23/2016  8:00 AM  Ongoing Placement Verification Auscultation 03/23/2016  8:00 AM  Status Infusing tube feed 03/23/2016  8:00 AM  Drainage Appearance Bloody;Brown 03/19/2016  8:00 AM  Intake (mL) 60 mL 03/22/2016 12:00 PM  Output (mL) 100 mL 03/17/2016  6:00 AM     Urethral Catheter Aborst RN  Straight-tip 16 Fr. (Active)  Indication for Insertion or Continuance of Catheter Unstable spinal/crush injuries 03/23/2016  7:17 AM  Site Assessment Clean;Intact;Dry 03/22/2016  8:00 AM  Catheter Maintenance Bag below level of bladder;Catheter secured;Drainage bag/tubing not touching floor;Insertion date on drainage bag;No dependent loops;Seal intact 03/23/2016  7:18 AM  Collection Container Standard drainage bag 03/22/2016  8:00 AM  Securement Method Securing device (Describe) 03/22/2016  8:00 AM  Urinary Catheter Interventions Unclamped 03/21/2016  8:00 PM  Output (mL) 125 mL 03/23/2016  8:00 AM    Microbiology/Sepsis markers: Results for orders placed or performed during the hospital encounter of 03/15/16  MRSA culture     Status: None   Collection Time: 03/17/16  1:39 PM  Result Value Ref Range Status   Specimen Description NASOPHARYNGEAL  Final   Special Requests NONE  Final  Culture MRSA DETECTED  Final   Report Status 03/19/2016 FINAL  Final  Culture, respiratory (NON-Expectorated)     Status: None   Collection Time: 03/18/16 11:12 AM  Result Value Ref Range Status   Specimen Description TRACHEAL ASPIRATE  Final   Special Requests Normal  Final   Gram Stain   Final    ABUNDANT WBC PRESENT, PREDOMINANTLY PMN RARE SQUAMOUS EPITHELIAL CELLS PRESENT ABUNDANT  GRAM POSITIVE COCCI IN PAIRS IN CLUSTERS MODERATE GRAM NEGATIVE RODS    Culture ABUNDANT STAPHYLOCOCCUS AUREUS  Final   Report Status 03/20/2016 FINAL  Final   Organism ID, Bacteria STAPHYLOCOCCUS AUREUS  Final      Susceptibility   Staphylococcus aureus - MIC*    CIPROFLOXACIN <=0.5 SENSITIVE Sensitive     ERYTHROMYCIN 0.5 SENSITIVE Sensitive     GENTAMICIN <=0.5 SENSITIVE Sensitive     OXACILLIN <=0.25 SENSITIVE Sensitive     TETRACYCLINE <=1 SENSITIVE Sensitive     VANCOMYCIN 1 SENSITIVE Sensitive     TRIMETH/SULFA <=10 SENSITIVE Sensitive     CLINDAMYCIN <=0.25 SENSITIVE Sensitive     RIFAMPIN <=0.5 SENSITIVE Sensitive     Inducible Clindamycin NEGATIVE Sensitive     * ABUNDANT STAPHYLOCOCCUS AUREUS  Culture, Urine     Status: None   Collection Time: 03/18/16  1:19 PM  Result Value Ref Range Status   Specimen Description URINE, CATHETERIZED  Final   Special Requests Normal  Final   Culture NO GROWTH  Final   Report Status 03/19/2016 FINAL  Final  Culture, blood (Routine X 2) w Reflex to ID Panel     Status: Abnormal   Collection Time: 03/18/16  1:35 PM  Result Value Ref Range Status   Specimen Description BLOOD LEFT HAND  Final   Special Requests BOTTLES DRAWN AEROBIC ONLY 8CC  Final   Culture  Setup Time   Final    GRAM POSITIVE COCCI IN CLUSTERS AEROBIC BOTTLE ONLY CRITICAL RESULT CALLED TO, READ BACK BY AND VERIFIED WITH: J MILLEN,PHARMD AT 1334 03/19/16 BY L BENFIELD    Culture (A)  Final    STAPHYLOCOCCUS SPECIES (COAGULASE NEGATIVE) THE SIGNIFICANCE OF ISOLATING THIS ORGANISM FROM A SINGLE SET OF BLOOD CULTURES WHEN MULTIPLE SETS ARE DRAWN IS UNCERTAIN. PLEASE NOTIFY THE MICROBIOLOGY DEPARTMENT WITHIN ONE WEEK IF SPECIATION AND SENSITIVITIES ARE REQUIRED.    Report Status 03/21/2016 FINAL  Final  Blood Culture ID Panel (Reflexed)     Status: Abnormal   Collection Time: 03/18/16  1:35 PM  Result Value Ref Range Status   Enterococcus species NOT DETECTED NOT  DETECTED Final   Listeria monocytogenes NOT DETECTED NOT DETECTED Final   Staphylococcus species DETECTED (A) NOT DETECTED Final    Comment: CRITICAL RESULT CALLED TO, READ BACK BY AND VERIFIED WITH: J MILLEN,PHARMD AT 1334 03/19/16 BY L BENFIELD    Staphylococcus aureus NOT DETECTED NOT DETECTED Final   Methicillin resistance NOT DETECTED NOT DETECTED Final   Streptococcus species NOT DETECTED NOT DETECTED Final   Streptococcus agalactiae NOT DETECTED NOT DETECTED Final   Streptococcus pneumoniae NOT DETECTED NOT DETECTED Final   Streptococcus pyogenes NOT DETECTED NOT DETECTED Final   Acinetobacter baumannii NOT DETECTED NOT DETECTED Final   Enterobacteriaceae species NOT DETECTED NOT DETECTED Final   Enterobacter cloacae complex NOT DETECTED NOT DETECTED Final   Escherichia coli NOT DETECTED NOT DETECTED Final   Klebsiella oxytoca NOT DETECTED NOT DETECTED Final   Klebsiella pneumoniae NOT DETECTED NOT DETECTED Final   Proteus species NOT DETECTED  NOT DETECTED Final   Serratia marcescens NOT DETECTED NOT DETECTED Final   Haemophilus influenzae NOT DETECTED NOT DETECTED Final   Neisseria meningitidis NOT DETECTED NOT DETECTED Final   Pseudomonas aeruginosa NOT DETECTED NOT DETECTED Final   Candida albicans NOT DETECTED NOT DETECTED Final   Candida glabrata NOT DETECTED NOT DETECTED Final   Candida krusei NOT DETECTED NOT DETECTED Final   Candida parapsilosis NOT DETECTED NOT DETECTED Final   Candida tropicalis NOT DETECTED NOT DETECTED Final  Culture, blood (Routine X 2) w Reflex to ID Panel     Status: None (Preliminary result)   Collection Time: 03/18/16  3:17 PM  Result Value Ref Range Status   Specimen Description BLOOD LEFT ARM  Final   Special Requests BOTTLES DRAWN AEROBIC AND ANAEROBIC 5CC  Final   Culture NO GROWTH 4 DAYS  Final   Report Status PENDING  Incomplete  MRSA PCR Screening     Status: None   Collection Time: 03/19/16  1:52 PM  Result Value Ref Range Status    MRSA by PCR NEGATIVE NEGATIVE Final    Comment:        The GeneXpert MRSA Assay (FDA approved for NASAL specimens only), is one component of a comprehensive MRSA colonization surveillance program. It is not intended to diagnose MRSA infection nor to guide or monitor treatment for MRSA infections.   Culture, respiratory (NON-Expectorated)     Status: None (Preliminary result)   Collection Time: 03/22/16 12:04 PM  Result Value Ref Range Status   Specimen Description TRACHEAL ASPIRATE  Final   Special Requests TRACHEAL ASPIRATE  Final   Gram Stain   Final    FEW WBC PRESENT, PREDOMINANTLY PMN RARE GRAM POSITIVE COCCI IN PAIRS RARE GRAM NEGATIVE COCCI IN PAIRS    Culture MODERATE STAPHYLOCOCCUS AUREUS  Final   Report Status PENDING  Incomplete    Anti-infectives:  Anti-infectives    Start     Dose/Rate Route Frequency Ordered Stop   03/21/16 1200  Ampicillin-Sulbactam (UNASYN) 3 g in sodium chloride 0.9 % 100 mL IVPB     3 g 200 mL/hr over 30 Minutes Intravenous Every 6 hours 03/21/16 1053 03/25/16 1159   03/19/16 2200  vancomycin (VANCOCIN) IVPB 1000 mg/200 mL premix  Status:  Discontinued     1,000 mg 200 mL/hr over 60 Minutes Intravenous Every 8 hours 03/19/16 1355 03/21/16 1053   03/19/16 1400  piperacillin-tazobactam (ZOSYN) IVPB 3.375 g  Status:  Discontinued     3.375 g 12.5 mL/hr over 240 Minutes Intravenous Every 8 hours 03/19/16 1041 03/21/16 1053   03/19/16 1400  vancomycin (VANCOCIN) 1,500 mg in sodium chloride 0.9 % 500 mL IVPB     1,500 mg 250 mL/hr over 120 Minutes Intravenous  Once 03/19/16 1355 03/19/16 1638      Best Practice/Protocols:  VTE Prophylaxis: Mechanical GI Prophylaxis: Proton Pump Inhibitor Continous Sedation Just switched to Precedex today.  Consults: Treatment Team:  Lisbeth Renshaw, MD Yolonda Kida, MD    Events:  Subjective:    Overnight Issues: No issues overnight.  Switching to conventional  setting.  Objective:  Vital signs for last 24 hours: Temp:  [97 F (36.1 C)-101.5 F (38.6 C)] 98.5 F (36.9 C) (12/27 0800) Pulse Rate:  [78-128] 99 (12/27 0800) Resp:  [24-32] 24 (12/27 0800) BP: (90-132)/(44-71) 132/69 (12/27 0800) SpO2:  [97 %-100 %] 100 % (12/27 0857) FiO2 (%):  [40 %] 40 % (12/27 0800) Weight:  [81 kg (178 lb 9.2  oz)] 81 kg (178 lb 9.2 oz) (12/27 0439)  Hemodynamic parameters for last 24 hours: CVP:  [9 mmHg-14 mmHg] 13 mmHg  Intake/Output from previous day: 12/26 0701 - 12/27 0700 In: 4406 [I.V.:2666; NG/GT:1440; IV Piggyback:300] Out: 1890 [Urine:1790; Chest Tube:100]  Intake/Output this shift: Total I/O In: 140.3 [I.V.:85.3; NG/GT:55] Out: 125 [Urine:125]  Vent settings for last 24 hours: Vent Mode: PRVC FiO2 (%):  [40 %] 40 % Set Rate:  [20 bmp-28 bmp] 20 bmp Vt Set:  [450 mL-550 mL] 550 mL PEEP:  [8 cmH20-10 cmH20] 8 cmH20 Plateau Pressure:  [17 cmH20-24 cmH20] 21 cmH20  Physical Exam:  General: no respiratory distress and will awaken and follow commands Neuro: oriented, nonfocal exam, weakness right lower extremity, weakness left lower extremity and paraplegic Resp: diminished breath sounds bibasilar CVS: regular rate and rhythm, S1, S2 normal, no murmur, click, rub or gallop and some sinue tachycardia GI: soft, nontender, BS WNL, no r/g and has been tolerating tube feedings well. Extremities: edema 2+ and may need to perform surveillance duplex studies  Results for orders placed or performed during the hospital encounter of 03/15/16 (from the past 24 hour(s))  Glucose, capillary     Status: Abnormal   Collection Time: 03/22/16 11:41 AM  Result Value Ref Range   Glucose-Capillary 124 (H) 65 - 99 mg/dL   Comment 1 Notify RN    Comment 2 Document in Chart   Culture, respiratory (NON-Expectorated)     Status: None (Preliminary result)   Collection Time: 03/22/16 12:04 PM  Result Value Ref Range   Specimen Description TRACHEAL ASPIRATE     Special Requests TRACHEAL ASPIRATE    Gram Stain      FEW WBC PRESENT, PREDOMINANTLY PMN RARE GRAM POSITIVE COCCI IN PAIRS RARE GRAM NEGATIVE COCCI IN PAIRS    Culture MODERATE STAPHYLOCOCCUS AUREUS    Report Status PENDING   Glucose, capillary     Status: None   Collection Time: 03/22/16  3:26 PM  Result Value Ref Range   Glucose-Capillary 97 65 - 99 mg/dL   Comment 1 Notify RN    Comment 2 Document in Chart   Glucose, capillary     Status: Abnormal   Collection Time: 03/22/16  8:35 PM  Result Value Ref Range   Glucose-Capillary 108 (H) 65 - 99 mg/dL  Glucose, capillary     Status: None   Collection Time: 03/23/16 12:54 AM  Result Value Ref Range   Glucose-Capillary 97 65 - 99 mg/dL  Glucose, capillary     Status: Abnormal   Collection Time: 03/23/16  5:07 AM  Result Value Ref Range   Glucose-Capillary 117 (H) 65 - 99 mg/dL  CBC     Status: Abnormal   Collection Time: 03/23/16  5:57 AM  Result Value Ref Range   WBC 7.0 4.0 - 10.5 K/uL   RBC 3.96 (L) 4.22 - 5.81 MIL/uL   Hemoglobin 11.4 (L) 13.0 - 17.0 g/dL   HCT 21.3 (L) 08.6 - 57.8 %   MCV 91.2 78.0 - 100.0 fL   MCH 28.8 26.0 - 34.0 pg   MCHC 31.6 30.0 - 36.0 g/dL   RDW 46.9 (H) 62.9 - 52.8 %   Platelets 174 150 - 400 K/uL  Basic metabolic panel     Status: Abnormal   Collection Time: 03/23/16  5:57 AM  Result Value Ref Range   Sodium 146 (H) 135 - 145 mmol/L   Potassium 3.5 3.5 - 5.1 mmol/L   Chloride 112 (  H) 101 - 111 mmol/L   CO2 25 22 - 32 mmol/L   Glucose, Bld 171 (H) 65 - 99 mg/dL   BUN 25 (H) 6 - 20 mg/dL   Creatinine, Ser 1.610.72 0.61 - 1.24 mg/dL   Calcium 8.0 (L) 8.9 - 10.3 mg/dL   GFR calc non Af Amer >60 >60 mL/min   GFR calc Af Amer >60 >60 mL/min   Anion gap 9 5 - 15  Glucose, capillary     Status: Abnormal   Collection Time: 03/23/16  7:55 AM  Result Value Ref Range   Glucose-Capillary 112 (H) 65 - 99 mg/dL   Comment 1 Notify RN    Comment 2 Document in Chart      Assessment/Plan:   NEURO   Altered Mental Status:  agitation, sedation and On propofol and will switch to Precedex   Plan: CPM  PULM  Atelectasis/collapse (bibasilar)   Plan: CXR shows a retained left hemothorax in addition to pulmonary contusion bilaterally and some consolidation of the RLL  CARDIO  Sinus Tachycardia   Plan: No specific treatment  RENAL  Hypervolemia May need diuresis   Plan: One dose of Lasix  GI  No specific issues   Plan: Continue tube feedings  ID  Pneumonia (hospital acquired (not ventilator-associated) Staph)   Plan: Continue antibiotics  HEME  Anemia acute blood loss anemia)   Plan: Repeat cbc.  Hemoglobin jump to 11.4 from 7.4  ENDO CPM   Plan: No specific issues  Global Issues  Patient slightly improved.  Will move to conventional ventilator settings.  Repeat CBC.  Consider trach/PEG later this week if not able to wean.  Discussed briefly with the father, but not in detail.  May consider trach/PEG Friday.    LOS: 8 days   Additional comments:I reviewed the patient's new clinical lab test results. cbc/bmet and I reviewed the patients new imaging test results. cxr  Critical Care Total Time*: 30 Minutes  Gray Doering 03/23/2016  *Care during the described time interval was provided by me and/or other providers on the critical care team.  I have reviewed this patient's available data, including medical history, events of note, physical examination and test results as part of my evaluation.

## 2016-03-24 ENCOUNTER — Inpatient Hospital Stay (HOSPITAL_COMMUNITY): Payer: Medicaid Other

## 2016-03-24 LAB — BASIC METABOLIC PANEL
ANION GAP: 4 — AB (ref 5–15)
BUN: 25 mg/dL — ABNORMAL HIGH (ref 6–20)
CO2: 31 mmol/L (ref 22–32)
Calcium: 7.6 mg/dL — ABNORMAL LOW (ref 8.9–10.3)
Chloride: 110 mmol/L (ref 101–111)
Creatinine, Ser: 0.6 mg/dL — ABNORMAL LOW (ref 0.61–1.24)
GLUCOSE: 91 mg/dL (ref 65–99)
POTASSIUM: 3.7 mmol/L (ref 3.5–5.1)
SODIUM: 145 mmol/L (ref 135–145)

## 2016-03-24 LAB — CULTURE, RESPIRATORY

## 2016-03-24 LAB — CBC
HCT: 23.7 % — ABNORMAL LOW (ref 39.0–52.0)
Hemoglobin: 7.6 g/dL — ABNORMAL LOW (ref 13.0–17.0)
MCH: 28.7 pg (ref 26.0–34.0)
MCHC: 32.1 g/dL (ref 30.0–36.0)
MCV: 89.4 fL (ref 78.0–100.0)
PLATELETS: 295 10*3/uL (ref 150–400)
RBC: 2.65 MIL/uL — AB (ref 4.22–5.81)
RDW: 16.5 % — ABNORMAL HIGH (ref 11.5–15.5)
WBC: 11.3 10*3/uL — AB (ref 4.0–10.5)

## 2016-03-24 LAB — GLUCOSE, CAPILLARY
GLUCOSE-CAPILLARY: 100 mg/dL — AB (ref 65–99)
GLUCOSE-CAPILLARY: 92 mg/dL (ref 65–99)
GLUCOSE-CAPILLARY: 98 mg/dL (ref 65–99)
Glucose-Capillary: 101 mg/dL — ABNORMAL HIGH (ref 65–99)
Glucose-Capillary: 93 mg/dL (ref 65–99)

## 2016-03-24 LAB — TRIGLYCERIDES: TRIGLYCERIDES: 181 mg/dL — AB (ref <150)

## 2016-03-24 LAB — CULTURE, RESPIRATORY W GRAM STAIN

## 2016-03-24 MED ORDER — PIVOT 1.5 CAL PO LIQD
1000.0000 mL | ORAL | Status: DC
  Administered 2016-03-27 – 2016-03-29 (×3): 1000 mL

## 2016-03-24 MED ORDER — MIDAZOLAM HCL 2 MG/2ML IJ SOLN
4.0000 mg | Freq: Once | INTRAMUSCULAR | Status: AC | PRN
  Administered 2016-03-24: 4 mg via INTRAVENOUS
  Filled 2016-03-24: qty 4

## 2016-03-24 MED ORDER — CLONAZEPAM 0.5 MG PO TABS
0.2500 mg | ORAL_TABLET | Freq: Two times a day (BID) | ORAL | Status: DC
  Administered 2016-03-24 (×2): 0.25 mg
  Filled 2016-03-24 (×2): qty 1

## 2016-03-24 MED ORDER — PRO-STAT SUGAR FREE PO LIQD
60.0000 mL | Freq: Three times a day (TID) | ORAL | Status: DC
  Administered 2016-03-24 – 2016-03-29 (×16): 60 mL
  Filled 2016-03-24 (×15): qty 60
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
## 2016-03-25 ENCOUNTER — Inpatient Hospital Stay (HOSPITAL_COMMUNITY): Payer: Medicaid Other

## 2016-03-25 DIAGNOSIS — J9601 Acute respiratory failure with hypoxia: Secondary | ICD-10-CM

## 2016-03-25 LAB — BLOOD GAS, ARTERIAL
ACID-BASE EXCESS: 6.4 mmol/L — AB (ref 0.0–2.0)
BICARBONATE: 30.6 mmol/L — AB (ref 20.0–28.0)
DRAWN BY: 448981
FIO2: 40
LHR: 20 {breaths}/min
MECHVT: 550 mL
O2 Saturation: 97.7 %
PATIENT TEMPERATURE: 99.5
PCO2 ART: 47.5 mmHg (ref 32.0–48.0)
PEEP/CPAP: 8 cmH2O
PO2 ART: 107 mmHg (ref 83.0–108.0)
pH, Arterial: 7.428 (ref 7.350–7.450)

## 2016-03-25 LAB — BASIC METABOLIC PANEL
ANION GAP: 6 (ref 5–15)
BUN: 28 mg/dL — ABNORMAL HIGH (ref 6–20)
CALCIUM: 8.1 mg/dL — AB (ref 8.9–10.3)
CO2: 30 mmol/L (ref 22–32)
CREATININE: 0.66 mg/dL (ref 0.61–1.24)
Chloride: 109 mmol/L (ref 101–111)
GFR calc non Af Amer: >60 mL/min (ref >60)
Glucose, Bld: 98 mg/dL (ref 65–99)
Potassium: 4.2 mmol/L (ref 3.5–5.1)
Sodium: 145 mmol/L (ref 135–145)

## 2016-03-25 LAB — GLUCOSE, CAPILLARY
GLUCOSE-CAPILLARY: 97 mg/dL (ref 65–99)
GLUCOSE-CAPILLARY: 94 mg/dL (ref 65–99)
GLUCOSE-CAPILLARY: 97 mg/dL (ref 65–99)
GLUCOSE-CAPILLARY: 90 mg/dL (ref 65–99)
Glucose-Capillary: 92 mg/dL (ref 65–99)
Glucose-Capillary: 98 mg/dL (ref 65–99)
Glucose-Capillary: 90 mg/dL (ref 65–99)
Glucose-Capillary: 107 mg/dL — ABNORMAL HIGH (ref 65–99)

## 2016-03-25 LAB — CBC
HCT: 24.6 % — ABNORMAL LOW (ref 39.0–52.0)
Hemoglobin: 7.8 g/dL — ABNORMAL LOW (ref 13.0–17.0)
MCH: 29.8 pg (ref 26.0–34.0)
MCHC: 31.7 g/dL (ref 30.0–36.0)
MCV: 93.9 fL (ref 78.0–100.0)
PLATELETS: 308 10*3/uL (ref 150–400)
RBC: 2.62 MIL/uL — ABNORMAL LOW (ref 4.22–5.81)
RDW: 16.7 % — AB (ref 11.5–15.5)
WBC: 9.4 10*3/uL (ref 4.0–10.5)

## 2016-03-25 LAB — TRIGLYCERIDES: TRIGLYCERIDES: 203 mg/dL — AB (ref <150)

## 2016-03-25 MED ORDER — QUETIAPINE FUMARATE 100 MG PO TABS
100.0000 mg | ORAL_TABLET | Freq: Three times a day (TID) | ORAL | Status: DC
  Administered 2016-03-25 – 2016-03-31 (×20): 100 mg
  Filled 2016-03-25 (×20): qty 1

## 2016-03-25 MED ORDER — CLONAZEPAM 1 MG PO TABS
1.0000 mg | ORAL_TABLET | Freq: Two times a day (BID) | ORAL | Status: DC
  Administered 2016-03-25 – 2016-04-01 (×16): 1 mg
  Filled 2016-03-25 (×17): qty 1

## 2016-03-25 NOTE — Progress Notes (Signed)
Follow up - Trauma and Critical Care  Patient Details:    Luis [name] is an 20 y.o. male.  Lines/tubes : Airway 7.5 mm (Active)  Secured at (cm) 22 cm 03/25/2016  8:25 AM  Measured From Lips 03/25/2016  8:25 AM  Secured Location Right 03/25/2016  8:25 AM  Secured By [employer] T[employer] 03/25/2016  8:25 AM  Tube Holder Repositioned Yes 03/25/2016  8:25 AM  Cuff Pressure (cm H2O) 26 cm H2O 03/25/2016  3:44 AM  Site Condition Dry 03/25/2016  8:25 AM     CVC Triple Lumen 03/16/16 Right Subclavian (Active)  Indication for Insertion or Continuance of Line Prolonged intravenous therapies 03/25/2016  7:45 AM  Site Assessment Clean;Dry;Intact 03/24/2016  8:00 PM  Proximal Lumen Status Infusing 03/24/2016  8:00 PM  Medial Lumen Status Capped (Central line) 03/24/2016  8:00 PM  Distal Lumen Status Infusing 03/24/2016  8:00 PM  Dressing Type Transparent;Occlusive 03/24/2016  8:00 PM  Dressing Status Dry;Antimicrobial disc in place;Intact;Old drainage 03/24/2016  8:00 PM  Line Care Connections checked and tightened;Line pulled back 03/24/2016  8:00 PM  Dressing Intervention New dressing;Dressing changed;Antimicrobial disc changed 03/24/2016  6:00 AM  Dressing Change Due 03/31/16 03/24/2016  8:00 PM     Chest Tube Right (Active)  Suction To water seal 03/24/2016  8:00 PM  Chest Tube Air Leak None 03/24/2016  8:00 PM  Patency Intervention Tip/tilt 03/22/2016  8:00 AM  Drainage Description Serosanguineous 03/24/2016  8:00 PM  Dressing Status Clean;Dry;Intact 03/24/2016  8:00 PM  Dressing Intervention New dressing 03/24/2016  4:00 AM  Site Assessment Clean;Intact 03/24/2016  8:00 PM  Surrounding Skin Unable to view 03/24/2016  8:00 PM  Output (mL) 150 mL 03/25/2016  6:34 AM     Chest Tube 3 Left;Anterior Pleural (Active)  Suction To water seal 03/25/2016  8:50 AM  Chest Tube Air Leak None 03/24/2016  8:00 PM  Patency Intervention Tip/tilt 03/22/2016  8:00 AM  Drainage  Description Serosanguineous 03/24/2016  8:00 PM  Dressing Status Clean;Dry;Intact 03/24/2016  8:00 PM  Dressing Intervention New dressing 03/24/2016  4:00 AM  Site Assessment Clean;Dry;Intact 03/24/2016  8:00 PM  Surrounding Skin Unable to view 03/24/2016  8:00 PM  Output (mL) 90 mL 03/25/2016  6:34 AM     NG/OG Tube Orogastric 18 Fr. Right mouth Xray Measured external length of tube (Active)  Site Assessment Clean;Intact 03/24/2016  8:00 PM  Ongoing Placement Verification Auscultation 03/24/2016  8:00 PM  Status Infusing tube feed 03/24/2016  8:00 PM  Drainage Appearance Bloody;Brown 03/19/2016  8:00 AM  Intake (mL) 60 mL 03/22/2016 12:00 PM  Output (mL) 100 mL 03/17/2016  6:00 AM     Urethral Catheter Aborst RN  Straight-tip 16 Fr. (Active)  Indication for Insertion or Continuance of Catheter Unstable spinal/crush injuries;Aggressive IV diuresis 03/24/2016  8:00 PM  Site Assessment Clean;Intact;Dry 03/24/2016  8:00 PM  Catheter Maintenance Bag below level of bladder;Catheter secured;Drainage bag/tubing not touching floor;Insertion date on drainage bag;No dependent loops;Seal intact 03/24/2016  8:00 PM  Collection Container Standard drainage bag 03/24/2016  8:00 PM  Securement Method Securing device (Describe) 03/24/2016  8:00 PM  Urinary Catheter Interventions Unclamped 03/24/2016  8:00 AM  Output (mL) 550 mL 03/25/2016  6:34 AM    Microbiology/Sepsis markers: Results for orders placed or performed during the hospital encounter of 03/15/16  MRSA culture     Status: None   Collection Time: 03/17/16  1:39 PM  Result Value Ref Range Status   Specimen Description  NASOPHARYNGEAL  Final   Special Requests NONE  Final   Culture MRSA DETECTED  Final   Report Status 03/19/2016 FINAL  Final  Culture, respiratory (NON-Expectorated)     Status: None   Collection Time: 03/18/16 11:12 AM  Result Value Ref Range Status   Specimen Description TRACHEAL ASPIRATE  Final   Special Requests Normal   Final   Gram Stain   Final    ABUNDANT WBC PRESENT, PREDOMINANTLY PMN RARE SQUAMOUS EPITHELIAL CELLS PRESENT ABUNDANT GRAM POSITIVE COCCI IN PAIRS IN CLUSTERS MODERATE GRAM NEGATIVE RODS    Culture ABUNDANT STAPHYLOCOCCUS AUREUS  Final   Report Status 03/20/2016 FINAL  Final   Organism ID, Bacteria STAPHYLOCOCCUS AUREUS  Final      Susceptibility   Staphylococcus aureus - MIC*    CIPROFLOXACIN <=0.5 SENSITIVE Sensitive     ERYTHROMYCIN 0.5 SENSITIVE Sensitive     GENTAMICIN <=0.5 SENSITIVE Sensitive     OXACILLIN <=0.25 SENSITIVE Sensitive     TETRACYCLINE <=1 SENSITIVE Sensitive     VANCOMYCIN 1 SENSITIVE Sensitive     TRIMETH/SULFA <=10 SENSITIVE Sensitive     CLINDAMYCIN <=0.25 SENSITIVE Sensitive     RIFAMPIN <=0.5 SENSITIVE Sensitive     Inducible Clindamycin NEGATIVE Sensitive     * ABUNDANT STAPHYLOCOCCUS AUREUS  Culture, Urine     Status: None   Collection Time: 03/18/16  1:19 PM  Result Value Ref Range Status   Specimen Description URINE, CATHETERIZED  Final   Special Requests Normal  Final   Culture NO GROWTH  Final   Report Status 03/19/2016 FINAL  Final  Culture, blood (Routine X 2) w Reflex to ID Panel     Status: Abnormal   Collection Time: 03/18/16  1:35 PM  Result Value Ref Range Status   Specimen Description BLOOD LEFT HAND  Final   Special Requests BOTTLES DRAWN AEROBIC ONLY 8CC  Final   Culture  Setup Time   Final    GRAM POSITIVE COCCI IN CLUSTERS AEROBIC BOTTLE ONLY CRITICAL RESULT CALLED TO, READ BACK BY AND VERIFIED WITH: J MILLEN,PHARMD AT 1334 03/19/16 BY L BENFIELD    Culture (A)  Final    STAPHYLOCOCCUS SPECIES (COAGULASE NEGATIVE) THE SIGNIFICANCE OF ISOLATING THIS ORGANISM FROM A SINGLE SET OF BLOOD CULTURES WHEN MULTIPLE SETS ARE DRAWN IS UNCERTAIN. PLEASE NOTIFY THE MICROBIOLOGY DEPARTMENT WITHIN ONE WEEK IF SPECIATION AND SENSITIVITIES ARE REQUIRED.    Report Status 03/21/2016 FINAL  Final  Blood Culture ID Panel (Reflexed)     Status:  Abnormal   Collection Time: 03/18/16  1:35 PM  Result Value Ref Range Status   Enterococcus species NOT DETECTED NOT DETECTED Final   Listeria monocytogenes NOT DETECTED NOT DETECTED Final   Staphylococcus species DETECTED (A) NOT DETECTED Final    Comment: CRITICAL RESULT CALLED TO, READ BACK BY AND VERIFIED WITH: J MILLEN,PHARMD AT 1334 03/19/16 BY L BENFIELD    Staphylococcus aureus NOT DETECTED NOT DETECTED Final   Methicillin resistance NOT DETECTED NOT DETECTED Final   Streptococcus species NOT DETECTED NOT DETECTED Final   Streptococcus agalactiae NOT DETECTED NOT DETECTED Final   Streptococcus pneumoniae NOT DETECTED NOT DETECTED Final   Streptococcus pyogenes NOT DETECTED NOT DETECTED Final   Acinetobacter baumannii NOT DETECTED NOT DETECTED Final   Enterobacteriaceae species NOT DETECTED NOT DETECTED Final   Enterobacter cloacae complex NOT DETECTED NOT DETECTED Final   Escherichia coli NOT DETECTED NOT DETECTED Final   Klebsiella oxytoca NOT DETECTED NOT DETECTED Final   Klebsiella  pneumoniae NOT DETECTED NOT DETECTED Final   Proteus species NOT DETECTED NOT DETECTED Final   Serratia marcescens NOT DETECTED NOT DETECTED Final   Haemophilus influenzae NOT DETECTED NOT DETECTED Final   Neisseria meningitidis NOT DETECTED NOT DETECTED Final   Pseudomonas aeruginosa NOT DETECTED NOT DETECTED Final   Candida albicans NOT DETECTED NOT DETECTED Final   Candida glabrata NOT DETECTED NOT DETECTED Final   Candida krusei NOT DETECTED NOT DETECTED Final   Candida parapsilosis NOT DETECTED NOT DETECTED Final   Candida tropicalis NOT DETECTED NOT DETECTED Final  Culture, blood (Routine X 2) w Reflex to ID Panel     Status: None   Collection Time: 03/18/16  3:17 PM  Result Value Ref Range Status   Specimen Description BLOOD LEFT ARM  Final   Special Requests BOTTLES DRAWN AEROBIC AND ANAEROBIC 5CC  Final   Culture NO GROWTH 5 DAYS  Final   Report Status 03/23/2016 FINAL  Final  MRSA  PCR Screening     Status: None   Collection Time: 03/19/16  1:52 PM  Result Value Ref Range Status   MRSA by PCR NEGATIVE NEGATIVE Final    Comment:        The GeneXpert MRSA Assay (FDA approved for NASAL specimens only), is one component of a comprehensive MRSA colonization surveillance program. It is not intended to diagnose MRSA infection nor to guide or monitor treatment for MRSA infections.   Culture, respiratory (NON-Expectorated)     Status: None   Collection Time: 03/22/16 12:04 PM  Result Value Ref Range Status   Specimen Description TRACHEAL ASPIRATE  Final   Special Requests TRACHEAL ASPIRATE  Final   Gram Stain   Final    FEW WBC PRESENT, PREDOMINANTLY PMN RARE GRAM POSITIVE COCCI IN PAIRS RARE GRAM NEGATIVE COCCI IN PAIRS    Culture MODERATE STAPHYLOCOCCUS AUREUS  Final   Report Status 03/24/2016 FINAL  Final   Organism ID, Bacteria STAPHYLOCOCCUS AUREUS  Final      Susceptibility   Staphylococcus aureus - MIC*    CIPROFLOXACIN <=0.5 SENSITIVE Sensitive     ERYTHROMYCIN 0.5 SENSITIVE Sensitive     GENTAMICIN <=0.5 SENSITIVE Sensitive     OXACILLIN <=0.25 SENSITIVE Sensitive     TETRACYCLINE <=1 SENSITIVE Sensitive     VANCOMYCIN 1 SENSITIVE Sensitive     TRIMETH/SULFA <=10 SENSITIVE Sensitive     CLINDAMYCIN <=0.25 SENSITIVE Sensitive     RIFAMPIN <=0.5 SENSITIVE Sensitive     Inducible Clindamycin NEGATIVE Sensitive     * MODERATE STAPHYLOCOCCUS AUREUS    Anti-infectives:  Anti-infectives    Start     Dose/Rate Route Frequency Ordered Stop   03/21/16 1200  Ampicillin-Sulbactam (UNASYN) 3 g in sodium chloride 0.9 % 100 mL IVPB     3 g 200 mL/hr over 30 Minutes Intravenous Every 6 hours 03/21/16 1053 03/25/16 0654   03/19/16 2200  vancomycin (VANCOCIN) IVPB 1000 mg/200 mL premix  Status:  Discontinued     1,000 mg 200 mL/hr over 60 Minutes Intravenous Every 8 hours 03/19/16 1355 03/21/16 1053   03/19/16 1400  piperacillin-tazobactam (ZOSYN) IVPB 3.375 g   Status:  Discontinued     3.375 g 12.5 mL/hr over 240 Minutes Intravenous Every 8 hours 03/19/16 1041 03/21/16 1053   03/19/16 1400  vancomycin (VANCOCIN) 1,500 mg in sodium chloride 0.9 % 500 mL IVPB     1,500 mg 250 mL/hr over 120 Minutes Intravenous  Once 03/19/16 1355 03/19/16 1638  Best Practice/Protocols:  VTE Prophylaxis: Lovenox (prophylaxtic dose) and Mechanical GI Prophylaxis: Proton Pump Inhibitor Continous Sedation Requiring large doses os sedatives  Consults: Treatment Team:  Lisbeth Renshaw, MD Yolonda Kida, MD    Events:  Subjective:    Overnight Issues: No specific issues overnight.  Objective:  Vital signs for last 24 hours: Temp:  [98.6 F (37 C)-101.3 F (38.5 C)] 99.5 F (37.5 C) (12/29 0800) Pulse Rate:  [81-142] 121 (12/29 0825) Resp:  [17-30] 23 (12/29 0825) BP: (89-144)/(42-85) 129/85 (12/29 0825) SpO2:  [92 %-100 %] 97 % (12/29 0825) FiO2 (%):  [40 %-100 %] 40 % (12/29 0825) Weight:  [89 kg (196 lb 3.4 oz)] 89 kg (196 lb 3.4 oz) (12/29 0500)  Hemodynamic parameters for last 24 hours: CVP:  [13 mmHg-28 mmHg] 18 mmHg  Intake/Output from previous day: 12/28 0701 - 12/29 0700 In: 3063.6 [I.V.:1943.6; NG/GT:720; IV Piggyback:400] Out: 2105 [Urine:1865; Chest Tube:240]  Intake/Output this shift: Total I/O In: 93.9 [I.V.:78.9; NG/GT:15] Out: -   Vent settings for last 24 hours: Vent Mode: PRVC FiO2 (%):  [40 %-100 %] 40 % Set Rate:  [20 bmp] 20 bmp Vt Set:  [550 mL] 550 mL PEEP:  [8 cmH20] 8 cmH20 Plateau Pressure:  [21 cmH20-26 cmH20] 26 cmH20  Physical Exam:  General: no respiratory distress and Will awaken and get agitated. Neuro: nonfocal exam, RASS 0, RASS -1 and agitated Resp: diminished breath sounds bibasilar and rhonchi LUL CVS: Sinus tachycardia GI: soft, nontender, BS WNL, no r/g and tolerating tube feedings well. Extremities: edema 2+ and good palpable pulses  Results for orders placed or performed  during the hospital encounter of 03/15/16 (from the past 24 hour(s))  Glucose, capillary     Status: Abnormal   Collection Time: 03/24/16 12:01 PM  Result Value Ref Range   Glucose-Capillary 101 (H) 65 - 99 mg/dL  Glucose, capillary     Status: None   Collection Time: 03/24/16  8:00 PM  Result Value Ref Range   Glucose-Capillary 92 65 - 99 mg/dL  Glucose, capillary     Status: None   Collection Time: 03/24/16  8:44 PM  Result Value Ref Range   Glucose-Capillary 93 65 - 99 mg/dL  Glucose, capillary     Status: None   Collection Time: 03/25/16  1:44 AM  Result Value Ref Range   Glucose-Capillary 97 65 - 99 mg/dL  Triglycerides     Status: Abnormal   Collection Time: 03/25/16  2:15 AM  Result Value Ref Range   Triglycerides 203 (H) <150 mg/dL  Glucose, capillary     Status: None   Collection Time: 03/25/16  4:13 AM  Result Value Ref Range   Glucose-Capillary 94 65 - 99 mg/dL  CBC     Status: Abnormal   Collection Time: 03/25/16  5:00 AM  Result Value Ref Range   WBC 9.4 4.0 - 10.5 K/uL   RBC 2.62 (L) 4.22 - 5.81 MIL/uL   Hemoglobin 7.8 (L) 13.0 - 17.0 g/dL   HCT 92.1 (L) 19.4 - 17.4 %   MCV 93.9 78.0 - 100.0 fL   MCH 29.8 26.0 - 34.0 pg   MCHC 31.7 30.0 - 36.0 g/dL   RDW 08.1 (H) 44.8 - 18.5 %   Platelets 308 150 - 400 K/uL  Basic metabolic panel     Status: Abnormal   Collection Time: 03/25/16  5:00 AM  Result Value Ref Range   Sodium 145 135 - 145 mmol/L  Potassium 4.2 3.5 - 5.1 mmol/L   Chloride 109 101 - 111 mmol/L   CO2 30 22 - 32 mmol/L   Glucose, Bld 98 65 - 99 mg/dL   BUN 28 (H) 6 - 20 mg/dL   Creatinine, Ser 1.61 0.61 - 1.24 mg/dL   Calcium 8.1 (L) 8.9 - 10.3 mg/dL   GFR calc non Af Amer >60 >60 mL/min   GFR calc Af Amer >60 >60 mL/min   Anion gap 6 5 - 15  Glucose, capillary     Status: None   Collection Time: 03/25/16  8:18 AM  Result Value Ref Range   Glucose-Capillary 98 65 - 99 mg/dL     Assessment/Plan:   NEURO  Altered Mental Status:   agitation, delirium and sedation   Plan: Requires a lot of sedation.  Will increase Klonopin and starte Seroquel  PULM  Atelectasis/collapse (bilateral) Acute Respiratory Failure (pulmonary trauma) Chest Wall Trauma multiple rib fractures, Lung Trauma (right, left, with contusion of lung, with pleural effusion and hemopneumothorax) and Pneumothorax (traumatic)   Plan: Both chest tubes to water seal.  Both with too much output for removal.  No air leaks.  CXR chows a significant effusion on the left side not being drained by the current chest tube.  CARDIO  Sinus Tachycardia   Plan: No specific treatment.  RENAL  Urine output is good.   Plan: CPM  GI  No specific issues   Plan: Continue tube feedings.  ID  Pneumonia (hospital acquired (not ventilator-associated) Staph.Staph)   Plan: Continue antibiotics  HEME  Anemia acute blood loss anemia)   Plan: No transfusions  ENDO No specific problems.   Plan: CPM  Global Issues  It seems less likely now that we will be able to extubated this patient.  Looking at next week for planned PEG/trach.  Will increase his sedation.  Has a large left pleural effusion. That may need to be percutaneously drained or have another chest tube placed.  MRI done.    LOS: 10 days   Additional comments:I reviewed the patient's new clinical lab test results. cbc/bmet and I reviewed the patients new imaging test results. cxr (pending currently)  Critical Care Total Time*: 30 Minutes  Veda Arrellano 03/25/2016  *Care during the described time interval was provided by me and/or other providers on the critical care team.  I have reviewed this patient's available data, including medical history, events of note, physical examination and test results as part of my evaluation.
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
## 2016-03-25 NOTE — Progress Notes (Signed)
Pt is off the floor at this time.

## 2016-03-26 ENCOUNTER — Inpatient Hospital Stay (HOSPITAL_COMMUNITY): Payer: Medicaid Other

## 2016-03-26 LAB — BASIC METABOLIC PANEL
ANION GAP: 6 (ref 5–15)
BUN: 25 mg/dL — ABNORMAL HIGH (ref 6–20)
CALCIUM: 8 mg/dL — AB (ref 8.9–10.3)
CHLORIDE: 107 mmol/L (ref 101–111)
CO2: 30 mmol/L (ref 22–32)
CREATININE: 0.61 mg/dL (ref 0.61–1.24)
GFR calc non Af Amer: >60 mL/min (ref >60)
Glucose, Bld: 93 mg/dL (ref 65–99)
Potassium: 4.1 mmol/L (ref 3.5–5.1)
SODIUM: 143 mmol/L (ref 135–145)

## 2016-03-26 LAB — CBC WITH DIFFERENTIAL/PLATELET
BASOS ABS: 0 10*3/uL (ref 0.0–0.1)
Basophils Relative: 0 %
EOS ABS: 0.2 10*3/uL (ref 0.0–0.7)
Eosinophils Relative: 2 %
HEMATOCRIT: 23.7 % — AB (ref 39.0–52.0)
HEMOGLOBIN: 7.4 g/dL — AB (ref 13.0–17.0)
LYMPHS PCT: 25 %
Lymphs Abs: 2.1 10*3/uL (ref 0.7–4.0)
MCH: 29 pg (ref 26.0–34.0)
MCHC: 31.2 g/dL (ref 30.0–36.0)
MCV: 92.9 fL (ref 78.0–100.0)
MONO ABS: 0.7 10*3/uL (ref 0.1–1.0)
Monocytes Relative: 8 %
NEUTROS PCT: 65 %
Neutro Abs: 5.2 10*3/uL (ref 1.7–7.7)
Platelets: 370 10*3/uL (ref 150–400)
RBC: 2.55 MIL/uL — ABNORMAL LOW (ref 4.22–5.81)
RDW: 16.2 % — AB (ref 11.5–15.5)
WBC: 8.2 10*3/uL (ref 4.0–10.5)

## 2016-03-26 LAB — BLOOD GAS, ARTERIAL
ACID-BASE EXCESS: 6.1 mmol/L — AB (ref 0.0–2.0)
BICARBONATE: 30.7 mmol/L — AB (ref 20.0–28.0)
Drawn by: 398661
FIO2: 40
LHR: 20 {breaths}/min
O2 Saturation: 90.7 %
PEEP/CPAP: 5 cmH2O
Patient temperature: 98.6
VT: 570 mL
pCO2 arterial: 49.3 mmHg — ABNORMAL HIGH (ref 32.0–48.0)
pH, Arterial: 7.41 (ref 7.350–7.450)
pO2, Arterial: 64.4 mmHg — ABNORMAL LOW (ref 83.0–108.0)

## 2016-03-26 LAB — GLUCOSE, CAPILLARY
GLUCOSE-CAPILLARY: 95 mg/dL (ref 65–99)
Glucose-Capillary: 100 mg/dL — ABNORMAL HIGH (ref 65–99)
Glucose-Capillary: 99 mg/dL (ref 65–99)
Glucose-Capillary: 97 mg/dL (ref 65–99)

## 2016-03-26 MED ORDER — FENTANYL 2500MCG IN NS 250ML (10MCG/ML) PREMIX INFUSION
25.0000 ug/h | INTRAVENOUS | Status: DC
  Administered 2016-03-26: 300 ug/h via INTRAVENOUS
  Administered 2016-03-26 – 2016-03-29 (×9): 400 ug/h via INTRAVENOUS
  Administered 2016-03-29: 350 ug/h via INTRAVENOUS
  Administered 2016-03-29 – 2016-03-30 (×2): 400 ug/h via INTRAVENOUS
  Administered 2016-03-30: 300 ug/h via INTRAVENOUS
  Administered 2016-03-30 (×2): 400 ug/h via INTRAVENOUS
  Administered 2016-03-31 (×3): 300 ug/h via INTRAVENOUS
  Administered 2016-04-01 (×2): 350 ug/h via INTRAVENOUS
  Administered 2016-04-01: 300 ug/h via INTRAVENOUS
  Administered 2016-04-02: 400 ug/h via INTRAVENOUS
  Administered 2016-04-02: 350 ug/h via INTRAVENOUS
  Administered 2016-04-02: 100 ug/h via INTRAVENOUS
  Filled 2016-03-26 (×12): qty 250
  Filled 2016-03-26: qty 500
  Filled 2016-03-26 (×15): qty 250
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
## 2016-03-26 NOTE — Progress Notes (Signed)
No change in status. Still appears completed the T8 level. Pulmonary situation stable but still not optimized for surgery.

## 2016-03-27 ENCOUNTER — Inpatient Hospital Stay (HOSPITAL_COMMUNITY): Payer: Medicaid Other

## 2016-03-27 LAB — COMPREHENSIVE METABOLIC PANEL
ALBUMIN: 1.6 g/dL — AB (ref 3.5–5.0)
ALT: 53 U/L (ref 17–63)
AST: 61 U/L — ABNORMAL HIGH (ref 15–41)
Alkaline Phosphatase: 117 U/L (ref 38–126)
Anion gap: 9 (ref 5–15)
BILIRUBIN TOTAL: 1.4 mg/dL — AB (ref 0.3–1.2)
BUN: 24 mg/dL — AB (ref 6–20)
CO2: 25 mmol/L (ref 22–32)
CREATININE: 0.6 mg/dL — AB (ref 0.61–1.24)
Calcium: 8.1 mg/dL — ABNORMAL LOW (ref 8.9–10.3)
Chloride: 104 mmol/L (ref 101–111)
GFR calc non Af Amer: >60 mL/min (ref >60)
Glucose, Bld: 98 mg/dL (ref 65–99)
Potassium: 4.3 mmol/L (ref 3.5–5.1)
SODIUM: 138 mmol/L (ref 135–145)
Total Protein: 5.2 g/dL — ABNORMAL LOW (ref 6.5–8.1)

## 2016-03-27 LAB — CBC WITH DIFFERENTIAL/PLATELET
Basophils Absolute: 0 10*3/uL (ref 0.0–0.1)
Basophils Relative: 0 %
EOS ABS: 0.2 10*3/uL (ref 0.0–0.7)
EOS PCT: 2 %
HCT: 24.5 % — ABNORMAL LOW (ref 39.0–52.0)
Hemoglobin: 7.9 g/dL — ABNORMAL LOW (ref 13.0–17.0)
Lymphocytes Relative: 20 %
Lymphs Abs: 1.9 10*3/uL (ref 0.7–4.0)
MCH: 28.8 pg (ref 26.0–34.0)
MCHC: 32.2 g/dL (ref 30.0–36.0)
MCV: 89.4 fL (ref 78.0–100.0)
MONOS PCT: 8 %
Monocytes Absolute: 0.8 10*3/uL (ref 0.1–1.0)
Neutro Abs: 6.7 10*3/uL (ref 1.7–7.7)
Neutrophils Relative %: 70 %
PLATELETS: 429 10*3/uL — AB (ref 150–400)
RBC: 2.74 MIL/uL — AB (ref 4.22–5.81)
RDW: 14.8 % (ref 11.5–15.5)
WBC: 9.7 10*3/uL (ref 4.0–10.5)

## 2016-03-27 LAB — GRAM STAIN

## 2016-03-27 LAB — GLUCOSE, CAPILLARY
GLUCOSE-CAPILLARY: 94 mg/dL (ref 65–99)
Glucose-Capillary: 103 mg/dL — ABNORMAL HIGH (ref 65–99)
Glucose-Capillary: 84 mg/dL (ref 65–99)
Glucose-Capillary: 88 mg/dL (ref 65–99)
Glucose-Capillary: 88 mg/dL (ref 65–99)
Glucose-Capillary: 93 mg/dL (ref 65–99)

## 2016-03-27 LAB — PROTIME-INR
INR: 1.22
PROTHROMBIN TIME: 15.4 s — AB (ref 11.4–15.2)

## 2016-03-27 LAB — TRIGLYCERIDES: Triglycerides: 258 mg/dL — ABNORMAL HIGH (ref <150)

## 2016-03-27 MED ORDER — LIDOCAINE HCL (PF) 1 % IJ SOLN
INTRAMUSCULAR | Status: AC
  Filled 2016-03-27: qty 30

## 2016-03-27 NOTE — Procedures (Signed)
Interventional Radiology Procedure Note  Procedure: CT guided left chest tube placement for loculated pleural effusion  Complications: None  Estimated Blood Loss: < 10 mL  12 Fr pigtail drain placed in left lateral pleural space from anterior approach.  Blood-tinged fluid return.  [facility]  Jodi MarbleGlenn T. Fredia SorrowYamagata, M.D Pager:  [phone]
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
## 2016-03-27 NOTE — Progress Notes (Signed)
Pt remained stable throughout procedure, no complications. Dressing to left chest is clean dry and intact, also unremarkable. Report given to Tammy RN, via phone
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
## 2016-03-27 NOTE — Progress Notes (Signed)
Pt did not require sedation for this procedure

## 2016-03-27 NOTE — Progress Notes (Signed)
eLink doctor made aware of last blood pressures where MAP < 65. No new orders at this time. Will continue to monitor.

## 2016-03-28 ENCOUNTER — Inpatient Hospital Stay (HOSPITAL_COMMUNITY): Payer: Medicaid Other

## 2016-03-28 DIAGNOSIS — J9601 Acute respiratory failure with hypoxia: Secondary | ICD-10-CM

## 2016-03-28 LAB — URINALYSIS, ROUTINE W REFLEX MICROSCOPIC
Glucose, UA: NEGATIVE mg/dL
Hgb urine dipstick: NEGATIVE
KETONES UR: NEGATIVE mg/dL
Leukocytes, UA: NEGATIVE
NITRITE: NEGATIVE
PH: 5 (ref 5.0–8.0)
PROTEIN: NEGATIVE mg/dL
Specific Gravity, Urine: 1.024 (ref 1.005–1.030)

## 2016-03-28 LAB — GLUCOSE, CAPILLARY
GLUCOSE-CAPILLARY: 102 mg/dL — AB (ref 65–99)
GLUCOSE-CAPILLARY: 105 mg/dL — AB (ref 65–99)
Glucose-Capillary: 96 mg/dL (ref 65–99)
Glucose-Capillary: 113 mg/dL — ABNORMAL HIGH (ref 65–99)

## 2016-03-28 MED ORDER — POTASSIUM CHLORIDE 20 MEQ/15ML (10%) PO SOLN
40.0000 meq | Freq: Once | ORAL | Status: AC
  Administered 2016-03-28: 40 meq
  Filled 2016-03-28: qty 30

## 2016-03-28 MED ORDER — SODIUM CHLORIDE 0.9 % IV SOLN
3.0000 g | Freq: Four times a day (QID) | INTRAVENOUS | Status: DC
  Administered 2016-03-28 – 2016-03-31 (×13): 3 g via INTRAVENOUS
  Filled 2016-03-28 (×17): qty 3

## 2016-03-28 MED ORDER — POLYETHYLENE GLYCOL 3350 17 G PO PACK
17.0000 g | PACK | Freq: Every day | ORAL | Status: DC
  Administered 2016-03-28 – 2016-04-19 (×13): 17 g via ORAL
  Filled 2016-03-28 (×14): qty 1

## 2016-03-28 MED ORDER — FUROSEMIDE 10 MG/ML IJ SOLN
40.0000 mg | Freq: Once | INTRAMUSCULAR | Status: AC
  Administered 2016-03-28: 40 mg via INTRAVENOUS
  Filled 2016-03-28: qty 4
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
## 2016-03-28 NOTE — Progress Notes (Signed)
    Subjective:   Procedure(s) (LRB): OPEN REDUCTION INTERNAL FIXATION (ORIF) TRIPOD FRACTURE (N/A) Patient reports pain as - pt in ICU  Objective: Vital signs in last 24 hours: Temp:  [100.2 F (37.9 C)-102.7 F (39.3 C)] 101.1 F (38.4 C) (01/01 0800) Pulse Rate:  [80-122] 122 (01/01 0800) Resp:  [20-27] 20 (01/01 0800) BP: (84-126)/(45-83) 93/51 (01/01 0700) SpO2:  [94 %-100 %] 100 % (01/01 0800) FiO2 (%):  [40 %-50 %] 40 % (01/01 0800) Weight:  [83.1 kg (183 lb 3.2 oz)] 83.1 kg (183 lb 3.2 oz) (01/01 0500)  Intake/Output from previous day: 12/31 0701 - 01/01 0700 In: 2185.5 [I.V.:1657; NG/GT:428.5; IV Piggyback:100] Out: 4870 [Urine:2460; Chest Tube:2410] Intake/Output this shift: Total I/O In: 142 [I.V.:142] Out: 260 [Urine:260]  Labs:  Recent Labs  03/26/16 0500 03/27/16 0359  HGB 7.4* 7.9*    Recent Labs  03/26/16 0500 03/27/16 0359  WBC 8.2 9.7  RBC 2.55* 2.74*  HCT 23.7* 24.5*  PLT 370 429*    Recent Labs  03/26/16 0500 03/27/16 0359  NA 143 138  K 4.1 4.3  CL 107 104  CO2 30 25  BUN 25* 24*  CREATININE 0.61 0.60*  GLUCOSE 93 98  CALCIUM 8.0* 8.1*    Recent Labs  03/27/16 0359  INR 1.22    Physical Exam: Intubated And sedated  Assessment/Plan:   Procedure(s) (LRB): OPEN REDUCTION INTERNAL FIXATION (ORIF) TRIPOD FRACTURE (N/A)  Anticipate non op tx Once pt is alert consider sling for comfort NWB LUE for 8 weeks, allow motion as tolerted in 2 weeks Pt will f/u with Dr. Aundria Rudogers 3 weeks after DC for new xrays or can be done as inpatient Dr. Aundria Rudogers Consulted 12/21  Mayo, Baxter Kailarmen Christina for Dr. Venita [facility]ahari Brooks [facility] [phone] 03/28/2016, 9:13 AM

## 2016-03-28 NOTE — Progress Notes (Signed)
eLink Physician-Brief Progress Note Patient Name: Luis [name] DOB: May 13, 1995 MRN: [id number]   Date of Service  03/28/2016  HPI/Events of Note  RN calls for fever. Patient with empyema. Has a pigtail catheter.   eICU Interventions  Panculture. Continue antibiotics pending culture.      Intervention Category Major Interventions: Other:  Daneen SchickJose Angelo A De Dios 03/28/2016, 3:38 AM
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
## 2016-03-28 NOTE — Progress Notes (Signed)
Patient ID: Luis [name], male   DOB: [birth year], 21 y.o.   MRN: [id number] BP (!) 93/51 (BP Location: Left Arm)   Pulse (!) 122   Temp (!) 101.1 F (38.4 C) (Axillary)   Resp 20   Ht 5\' 6"  (1.676 m)   Wt 83.1 kg (183 lb 3.2 oz)   SpO2 100%   BMI 29.57 kg/m  Exam unchanged. Will need stabilization.

## 2016-03-29 LAB — LIPASE, BLOOD: LIPASE: 14 U/L (ref 11–51)

## 2016-03-29 MED ORDER — PIVOT 1.5 CAL PO LIQD: 1000.0000 mL | ORAL | Status: DC

## 2016-03-29 MED ORDER — PRO-STAT SUGAR FREE PO LIQD
60.0000 mL | Freq: Two times a day (BID) | ORAL | Status: DC
  Administered 2016-03-30 – 2016-04-08 (×17): 60 mL
  Filled 2016-03-29 (×16): qty 60
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
## 2016-03-29 NOTE — Progress Notes (Signed)
Orthopedic Tech Progress Note Patient Details:  Luis [name] 1995/10/23 [id number]  Patient ID: Luis NeedleArturo Arechiga [name], male   DOB: 1995/10/23, 20 y.o.   MRN: [id number]   Nikki DomCrawford, Ethaniel Garfield 03/29/2016, 4:15 PM Called in bio-tech brace order; spoke with Judeth CornfieldStephanie

## 2016-03-29 NOTE — Progress Notes (Signed)
Orthopedic Tech Progress Note Patient Details:  [name] [birth year] [id number]  Ortho Devices Type of Ortho Device: Postop shoe/boot Ortho Device/Splint Location: (B) LE Ortho Device/Splint Interventions: Ordered, Application   Jennye MoccasinHughes, Kathalene Sporer Craig 03/29/2016, 4:20 PM

## 2016-03-29 NOTE — Progress Notes (Signed)
Orthopedic Tech Progress Note Patient Details:  Luis Needlerturo Arechiga [name] 1995-04-21 [id number]  Patient ID: Luis [name], male   DOB: 1995-04-21, 20 y.o.   MRN: [id number]   Nikki DomCrawford, Philena Obey 03/29/2016, 4:17 PM Called in bio-tech brace order; spoke with Judeth CornfieldStephanie

## 2016-03-29 NOTE — Progress Notes (Signed)
  PHARMACY - PHYSICIAN COMMUNICATION CRITICAL VALUE ALERT - BLOOD CULTURE IDENTIFICATION (BCID)  Results for orders placed or performed during the hospital encounter of 03/15/16  Blood Culture ID Panel (Reflexed) (Collected: 03/18/2016  1:35 PM)  Result Value Ref Range   Enterococcus species NOT DETECTED NOT DETECTED   Listeria monocytogenes NOT DETECTED NOT DETECTED   Staphylococcus species DETECTED (A) NOT DETECTED   Staphylococcus aureus NOT DETECTED NOT DETECTED   Methicillin resistance NOT DETECTED NOT DETECTED   Streptococcus species NOT DETECTED NOT DETECTED   Streptococcus agalactiae NOT DETECTED NOT DETECTED   Streptococcus pneumoniae NOT DETECTED NOT DETECTED   Streptococcus pyogenes NOT DETECTED NOT DETECTED   Acinetobacter baumannii NOT DETECTED NOT DETECTED   Enterobacteriaceae species NOT DETECTED NOT DETECTED   Enterobacter cloacae complex NOT DETECTED NOT DETECTED   Escherichia coli NOT DETECTED NOT DETECTED   Klebsiella oxytoca NOT DETECTED NOT DETECTED   Klebsiella pneumoniae NOT DETECTED NOT DETECTED   Proteus species NOT DETECTED NOT DETECTED   Serratia marcescens NOT DETECTED NOT DETECTED   Haemophilus influenzae NOT DETECTED NOT DETECTED   Neisseria meningitidis NOT DETECTED NOT DETECTED   Pseudomonas aeruginosa NOT DETECTED NOT DETECTED   Candida albicans NOT DETECTED NOT DETECTED   Candida glabrata NOT DETECTED NOT DETECTED   Candida krusei NOT DETECTED NOT DETECTED   Candida parapsilosis NOT DETECTED NOT DETECTED   Candida tropicalis NOT DETECTED NOT DETECTED    Name of physician (or Provider) Contacted: [facility] (no call back)  Repeat blood cx's are 1/2 with GPC in clusters. No BCID run on this set since was 1/2 positive a couple days earlier with CoNS that was sensitive to PCNs. Most likely contaminant but currently being treated with Unasyn for MSSA PNA. Follow clinical picture and cx finalization.  Changes to prescribed antibiotics required: No  change needed  Enzo BiNathan Calista Crain, PharmD, [facility] Clinical Pharmacist Pager [phone] 03/29/2016 7:32 AM

## 2016-03-30 ENCOUNTER — Inpatient Hospital Stay (HOSPITAL_COMMUNITY): Payer: Medicaid Other

## 2016-03-30 ENCOUNTER — Encounter (HOSPITAL_COMMUNITY): Admission: EM | Disposition: A | Discharge status: Rehabilitation Facility | Payer: Self-pay | Source: Home / Self Care

## 2016-03-30 ENCOUNTER — Inpatient Hospital Stay (HOSPITAL_COMMUNITY): Payer: Medicaid Other | Admitting: Certified Registered"

## 2016-03-30 ENCOUNTER — Other Ambulatory Visit: Payer: Self-pay | Admitting: Neurosurgery

## 2016-03-30 ENCOUNTER — Encounter (HOSPITAL_COMMUNITY): Payer: Self-pay | Admitting: Certified Registered"

## 2016-03-30 HISTORY — PX: ESOPHAGOGASTRODUODENOSCOPY: SHX5428

## 2016-03-30 HISTORY — PX: PEG PLACEMENT: SHX5437

## 2016-03-30 HISTORY — PX: PERCUTANEOUS TRACHEOSTOMY: SHX5288

## 2016-03-30 HISTORY — PX: ORIF TRIPOD FRACTURE: SHX5211

## 2016-03-30 LAB — CBC
HCT: 22.2 % — ABNORMAL LOW (ref 39.0–52.0)
HEMOGLOBIN: 7.1 g/dL — AB (ref 13.0–17.0)
MCH: 28.4 pg (ref 26.0–34.0)
MCHC: 32 g/dL (ref 30.0–36.0)
MCV: 88.8 fL (ref 78.0–100.0)
Platelets: 438 10*3/uL — ABNORMAL HIGH (ref 150–400)
RBC: 2.5 MIL/uL — ABNORMAL LOW (ref 4.22–5.81)
RDW: 14.3 % (ref 11.5–15.5)
WBC: 8.5 10*3/uL (ref 4.0–10.5)

## 2016-03-30 LAB — BASIC METABOLIC PANEL
ANION GAP: 9 (ref 5–15)
BUN: 21 mg/dL — AB (ref 6–20)
CO2: 27 mmol/L (ref 22–32)
Calcium: 8.1 mg/dL — ABNORMAL LOW (ref 8.9–10.3)
Chloride: 103 mmol/L (ref 101–111)
Creatinine, Ser: 0.64 mg/dL (ref 0.61–1.24)
GFR calc Af Amer: >60 mL/min (ref >60)
GLUCOSE: 97 mg/dL (ref 65–99)
Potassium: 3.8 mmol/L (ref 3.5–5.1)
SODIUM: 139 mmol/L (ref 135–145)

## 2016-03-30 LAB — POCT I-STAT 3, ART BLOOD GAS (G3+)
ACID-BASE EXCESS: 3 mmol/L — AB (ref 0.0–2.0)
Bicarbonate: 26.6 mmol/L (ref 20.0–28.0)
O2 Saturation: 98 %
PH ART: 7.466 — AB (ref 7.350–7.450)
TCO2: 28 mmol/L (ref 0–100)
pCO2 arterial: 36.9 mmHg (ref 32.0–48.0)
pO2, Arterial: 97 mmHg (ref 83.0–108.0)

## 2016-03-30 LAB — MAGNESIUM: MAGNESIUM: 1.9 mg/dL (ref 1.7–2.4)

## 2016-03-30 LAB — PREPARE RBC (CROSSMATCH)

## 2016-03-30 LAB — TRIGLYCERIDES: TRIGLYCERIDES: 256 mg/dL — AB (ref <150)

## 2016-03-30 LAB — PHOSPHORUS: PHOSPHORUS: 4.5 mg/dL (ref 2.5–4.6)

## 2016-03-30 SURGERY — Surgical Case
Anesthesia: *Unknown

## 2016-03-30 SURGERY — EGD (ESOPHAGOGASTRODUODENOSCOPY)
Anesthesia: Moderate Sedation

## 2016-03-30 SURGERY — OPEN REDUCTION INTERNAL FIXATION (ORIF) TRIPOD FRACTURE
Anesthesia: General | Site: Neck

## 2016-03-30 SURGERY — CREATION, TRACHEOSTOMY, PERCUTANEOUS
Anesthesia: Choice

## 2016-03-30 MED ORDER — BACITRACIN ZINC 500 UNIT/GM EX OINT
TOPICAL_OINTMENT | CUTANEOUS | Status: DC | PRN
  Administered 2016-03-30: 1 via TOPICAL

## 2016-03-30 MED ORDER — FENTANYL CITRATE (PF) 100 MCG/2ML IJ SOLN
INTRAMUSCULAR | Status: AC
  Filled 2016-03-30: qty 4

## 2016-03-30 MED ORDER — SODIUM CHLORIDE 0.9 % IV SOLN: Freq: Once | INTRAVENOUS | Status: DC

## 2016-03-30 MED ORDER — WHITE PETROLATUM GEL
Status: AC
  Filled 2016-03-30: qty 1

## 2016-03-30 MED ORDER — MIDAZOLAM HCL 2 MG/2ML IJ SOLN
INTRAMUSCULAR | Status: AC
  Filled 2016-03-30: qty 2

## 2016-03-30 MED ORDER — PHENYLEPHRINE 40 MCG/ML (10ML) SYRINGE FOR IV PUSH (FOR BLOOD PRESSURE SUPPORT)
PREFILLED_SYRINGE | INTRAVENOUS | Status: DC | PRN
  Administered 2016-03-30 (×4): 80 ug via INTRAVENOUS

## 2016-03-30 MED ORDER — ROCURONIUM BROMIDE 10 MG/ML (PF) SYRINGE
PREFILLED_SYRINGE | INTRAVENOUS | Status: DC | PRN
  Administered 2016-03-30 (×5): 50 mg via INTRAVENOUS

## 2016-03-30 MED ORDER — ROCURONIUM BROMIDE 50 MG/5ML IV SOSY
PREFILLED_SYRINGE | INTRAVENOUS | Status: AC
  Filled 2016-03-30: qty 5

## 2016-03-30 MED ORDER — LIDOCAINE-EPINEPHRINE 1 %-1:100000 IJ SOLN
INTRAMUSCULAR | Status: DC | PRN
  Administered 2016-03-30: 7 mL via INTRADERMAL

## 2016-03-30 MED ORDER — 0.9 % SODIUM CHLORIDE (POUR BTL) OPTIME
TOPICAL | Status: DC | PRN
  Administered 2016-03-30: 1000 mL

## 2016-03-30 MED ORDER — PIVOT 1.5 CAL PO LIQD
1000.0000 mL | ORAL | Status: DC
  Administered 2016-03-30 – 2016-04-07 (×7): 1000 mL

## 2016-03-30 MED ORDER — FENTANYL CITRATE (PF) 100 MCG/2ML IJ SOLN
INTRAMUSCULAR | Status: DC | PRN
  Administered 2016-03-30 (×4): 50 ug via INTRAVENOUS

## 2016-03-30 MED ORDER — ARTIFICIAL TEARS OP OINT
TOPICAL_OINTMENT | OPHTHALMIC | Status: DC | PRN
  Administered 2016-03-30: 1 via OPHTHALMIC

## 2016-03-30 MED ORDER — PHENYLEPHRINE 40 MCG/ML (10ML) SYRINGE FOR IV PUSH (FOR BLOOD PRESSURE SUPPORT)
PREFILLED_SYRINGE | INTRAVENOUS | Status: AC
  Filled 2016-03-30: qty 10

## 2016-03-30 MED ORDER — LACTATED RINGERS IV SOLN
INTRAVENOUS | Status: DC | PRN
  Administered 2016-03-30 (×2): via INTRAVENOUS

## 2016-03-30 MED ORDER — MIDAZOLAM HCL 2 MG/2ML IJ SOLN
INTRAMUSCULAR | Status: DC | PRN
  Administered 2016-03-30: 2 mg via INTRAVENOUS

## 2016-03-30 MED ORDER — OXYMETAZOLINE HCL 0.05 % NA SOLN
NASAL | Status: AC
  Filled 2016-03-30: qty 15

## 2016-03-30 MED ORDER — BACITRACIN ZINC 500 UNIT/GM EX OINT
TOPICAL_OINTMENT | CUTANEOUS | Status: AC
Start: 2016-03-30 — End: 2016-03-30
  Filled 2016-03-30: qty 28.35

## 2016-03-30 MED ORDER — GELATIN ADSORBABLE OP FILM
ORAL_FILM | OPHTHALMIC | Status: AC
  Filled 2016-03-30: qty 1

## 2016-03-30 SURGICAL SUPPLY — 80 items
BIT DRILL UPPR FCE 0.9M 4 TWST (BIT) ×2 IMPLANT
BLADE SURG 15 STRL LF DISP TIS (BLADE) ×2 IMPLANT
BLADE SURG 15 STRL SS (BLADE) ×2
BLADE SURG ROTATE 9660 (MISCELLANEOUS) IMPLANT
BUR SABER DIAMOND 3.0 (BURR) ×4 IMPLANT
CANISTER SUCTION 2500CC (MISCELLANEOUS) ×4 IMPLANT
CHLORAPREP W/TINT 26ML (MISCELLANEOUS) ×8 IMPLANT
CLEANER TIP ELECTROSURG 2X2 (MISCELLANEOUS) ×4 IMPLANT
CLOSURE WOUND 1/2 X4 (GAUZE/BANDAGES/DRESSINGS) ×1
CORDS BIPOLAR (ELECTRODE) ×4 IMPLANT
COVER MAYO STAND STRL (DRAPES) ×4 IMPLANT
COVER SURGICAL LIGHT HANDLE (MISCELLANEOUS) ×4 IMPLANT
DECANTER SPIKE VIAL GLASS SM (MISCELLANEOUS) ×4 IMPLANT
DRAPE LAPAROTOMY 100X72 PEDS (DRAPES) ×4 IMPLANT
DRAPE PROXIMA HALF (DRAPES) ×16 IMPLANT
DRAPE UTILITY XL STRL (DRAPES) ×4 IMPLANT
DRILL UPPERFACE 0.9M 4MM TWIST (BIT) ×4
ELECT CAUTERY BLADE 6.4 (BLADE) ×4 IMPLANT
ELECT COATED BLADE 2.86 ST (ELECTRODE) ×4 IMPLANT
ELECT NEEDLE TIP 2.8 STRL (NEEDLE) ×4 IMPLANT
ELECT REM PT RETURN 9FT ADLT (ELECTROSURGICAL) ×8
ELECTRODE REM PT RTRN 9FT ADLT (ELECTROSURGICAL) ×4 IMPLANT
FORCEPS BIPOLAR SPETZLER 8 1.0 (NEUROSURGERY SUPPLIES) ×4 IMPLANT
GAUZE SPONGE 4X4 16PLY XRAY LF (GAUZE/BANDAGES/DRESSINGS) ×8 IMPLANT
GLOVE BIO SURGEON STRL SZ 6 (GLOVE) IMPLANT
GLOVE BIO SURGEON STRL SZ7 (GLOVE) ×4 IMPLANT
GLOVE BIO SURGEON STRL SZ7.5 (GLOVE) ×12 IMPLANT
GLOVE BIO SURGEON STRL SZ8 (GLOVE) IMPLANT
GLOVE BIOGEL PI IND STRL 6.5 (GLOVE) IMPLANT
GLOVE BIOGEL PI IND STRL 7.0 (GLOVE) ×2 IMPLANT
GLOVE BIOGEL PI IND STRL 8 (GLOVE) ×2 IMPLANT
GLOVE BIOGEL PI INDICATOR 6.5 (GLOVE)
GLOVE BIOGEL PI INDICATOR 7.0 (GLOVE) ×2
GLOVE BIOGEL PI INDICATOR 8 (GLOVE) ×2
GLOVE ECLIPSE 7.5 STRL STRAW (GLOVE) ×4 IMPLANT
GLOVE SURG SS PI 6.5 STRL IVOR (GLOVE) ×12 IMPLANT
GOWN STRL REUS W/ TWL LRG LVL3 (GOWN DISPOSABLE) ×10 IMPLANT
GOWN STRL REUS W/ TWL XL LVL3 (GOWN DISPOSABLE) ×2 IMPLANT
GOWN STRL REUS W/TWL LRG LVL3 (GOWN DISPOSABLE) ×10
GOWN STRL REUS W/TWL XL LVL3 (GOWN DISPOSABLE) ×2
INTRODUCER TRACH BLUE RHINO 6F (TUBING) IMPLANT
INTRODUCER TRACH BLUE RHINO 8F (TUBING) IMPLANT
KIT BASIN OR (CUSTOM PROCEDURE TRAY) ×4 IMPLANT
KIT ROOM TURNOVER OR (KITS) ×4 IMPLANT
MARKER SKIN DUAL TIP RULER LAB (MISCELLANEOUS) ×4 IMPLANT
NEEDLE HYPO 25GX1X1/2 BEV (NEEDLE) ×4 IMPLANT
NEEDLE PRECISIONGLIDE 27X1.5 (NEEDLE) ×4 IMPLANT
NS IRRIG 1000ML POUR BTL (IV SOLUTION) ×8 IMPLANT
PAD ARMBOARD 7.5X6 YLW CONV (MISCELLANEOUS) ×8 IMPLANT
PENCIL BUTTON HOLSTER BLD 10FT (ELECTRODE) ×8 IMPLANT
PLATE UPPER FACE 24H STRAIGHT (Plate) ×4 IMPLANT
PLATE UPPER FACE 6X2H 3D (Plate) ×4 IMPLANT
SCISSORS WIRE ANG 4 3/4 DISP (INSTRUMENTS) ×4 IMPLANT
SCREW UPPERFACE 1.2X4M SLF TAP (Screw) ×48 IMPLANT
SCREW UPPERFACE 1.2X6M SLF TAP (Screw) ×4 IMPLANT
SOL PREP POV-IOD 4OZ 10% (MISCELLANEOUS) ×4 IMPLANT
SPONGE DRAIN TRACH 4X4 STRL 2S (GAUZE/BANDAGES/DRESSINGS) ×4 IMPLANT
SPONGE INTESTINAL PEANUT (DISPOSABLE) ×4 IMPLANT
STRIP CLOSURE SKIN 1/2X4 (GAUZE/BANDAGES/DRESSINGS) ×3 IMPLANT
SUT ETHILON 4 0 CL P 3 (SUTURE) IMPLANT
SUT PROLENE 5 0 C 1 36 (SUTURE) ×4 IMPLANT
SUT PROLENE 6 0 PC 1 (SUTURE) IMPLANT
SUT SILK 2 0 SH CR/8 (SUTURE) ×4 IMPLANT
SUT STEEL 0 (SUTURE)
SUT STEEL 0 18XMFL TIE 17 (SUTURE) IMPLANT
SUT STEEL 2 (SUTURE) IMPLANT
SUT VIC AB 3-0 SH 27 (SUTURE) ×2
SUT VIC AB 3-0 SH 27X BRD (SUTURE) ×2 IMPLANT
SUT VICRYL 4-0 PS2 18IN ABS (SUTURE) IMPLANT
SUT VICRYL AB 3 0 TIES (SUTURE) ×4 IMPLANT
TOWEL OR 17X24 6PK STRL BLUE (TOWEL DISPOSABLE) ×4 IMPLANT
TOWEL OR 17X26 10 PK STRL BLUE (TOWEL DISPOSABLE) ×4 IMPLANT
TRAY ENT MC OR (CUSTOM PROCEDURE TRAY) ×4 IMPLANT
TUBE CONNECTING 12'X1/4 (SUCTIONS) ×2
TUBE CONNECTING 12X1/4 (SUCTIONS) ×6 IMPLANT
TUBE CONNECTING 20'X1/4 (TUBING) ×1
TUBE CONNECTING 20X1/4 (TUBING) ×3 IMPLANT
TUBE TRACH SHILEY  6 DIST  CUF (TUBING) ×4 IMPLANT
WATER STERILE IRR 1000ML POUR (IV SOLUTION) ×4 IMPLANT
YANKAUER SUCT BULB TIP NO VENT (SUCTIONS) ×4 IMPLANT

## 2016-03-30 NOTE — Op Note (Signed)
[facility] Patient Name: Luis Luis [name] Procedure Date : 03/30/2016 MRN: [id number] Attending MD: Kathrin Ruddy, MD Date of Birth: [birth year] CSN: [id number] Age: 21 Admit Type: Inpatient Procedure:                Upper GI endoscopy Indications:              Place PEG because patient is unable to eat, Place                            PEG because patient requires ventilator support,                            Place PEG to improve nutrition in patient with                            prolonged severe illness Providers:                Kathrin Ruddy, MD Referring MD:              Medicines:                General Anesthesia Complications:            No immediate complications. Estimated Blood Loss:      Procedure:                Pre-Anesthesia Assessment:                           - This assessment was completed prior to the                            administration of sedation.                           - Prior to the procedure, a History and Physical                            was performed, and patient medications and                            allergies were reviewed. The patient is unable to                            give consent secondary to the patient's altered                            mental status. The risks and benefits of the                            procedure and the sedation options and risks were                            discussed with the patient's father. All questions                            were answered  and informed consent was obtained.                            Patient identification and proposed procedure were                            verified by the physician in the procedure room.                            Mental Status Examination: normal. Airway                            Examination: orotracheal intubation. Respiratory                            Examination: clear to auscultation. CV Examination:       tachycardia noted. Prophylactic Antibiotics: The                            patient does not require prophylactic antibiotics.                            Prior Anticoagulants: The patient has taken no                            previous anticoagulant or antiplatelet agents. ASA                            Grade Assessment: Luis [name] - A patient with severe                            systemic disease. After reviewing the risks and                            benefits, the patient was deemed in satisfactory                            condition to undergo the procedure. The anesthesia                            plan was to use general anesthesia. Immediately                            prior to administration of medications, the patient                            was re-assessed for adequacy to receive sedatives.                            The heart rate, respiratory rate, oxygen                            saturations, blood pressure, adequacy of pulmonary  ventilation, and response to care were monitored                            throughout the procedure. The physical status of                            the patient was re-assessed after the procedure.                           After obtaining informed consent, the endoscope was                            passed under direct vision. Throughout the                            procedure, the patient's blood pressure, pulse, and                            oxygen saturations were monitored continuously. The                            EG-[id number] ([id number]) scope was introduced through the                            mouth, and advanced to the second part of duodenum.                            The upper GI endoscopy was accomplished with ease. Scope In: Scope Out: Findings:      The upper third of the esophagus was normal.      The gastroesophageal junction was normal.      The in the duodenum was normal. Impression:                - Normal upper third of esophagus.                           - Normal gastroesophageal junction.                           - Normal.                           - No specimens collected. Recommendation:           - Please follow the post-PEG recommendations                            including: may use PEG today for meds and water,                            use PEG today after checked by physician and start                            using PEG today. Procedure Code(s):        --- Professional ---  16109, Esophagogastroduodenoscopy, flexible,                            transoral; diagnostic, including collection of                            specimen(s) by brushing or washing, when performed                            (separate procedure) Diagnosis Code(s):        --- Professional ---                           R63.3, Feeding difficulties                           Z43.1, Encounter for attention to gastrostomy                           Z99.11, Dependence on respirator [ventilator] status                           E63.9, Nutritional deficiency, unspecified CPT copyright 2016 [employer]. All rights reserved. The codes documented in this report are preliminary and upon coder review may  be revised to meet current compliance requirements. Kathrin Ruddy, MD Luis Norman III, MD 03/30/2016 7:14:59 PM This report has been signed electronically. Number of Addenda: 0
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
## 2016-03-30 NOTE — Anesthesia Postprocedure Evaluation (Signed)
Anesthesia Post Note  Patient: Luis [name]  Procedure(s) Performed: Procedure(s) (LRB): OPEN REDUCTION INTERNAL FIXATION (ORIF) LEFT ANTERIOR FRONTAL SINUS FRACTURE (N/A) PERCUTANEOUS TRACHEOSTOMY/PEG (Bilateral)  Patient location during evaluation: PACU Anesthesia Type: General Level of consciousness: awake and alert Pain management: pain level controlled Vital Signs Assessment: post-procedure vital signs reviewed and stable Respiratory status: spontaneous breathing, nonlabored ventilation, respiratory function stable and patient connected to nasal cannula oxygen Cardiovascular status: blood pressure returned to baseline and stable Postop Assessment: no signs of nausea or vomiting Anesthetic complications: no       Last Vitals:  Vitals:   03/30/16 1215 03/30/16 1600  BP: (!) 99/50   Pulse: 87   Resp: 20   Temp: (!) 38.2 C (!) 38.4 C    Last Pain:  Vitals:   03/30/16 1600  TempSrc: Axillary  PainSc:                  Sible Straley DAVID

## 2016-03-30 NOTE — Anesthesia Preprocedure Evaluation (Signed)
Anesthesia Evaluation  Patient identified by MRN, date of birth, ID band Patient unresponsive    Reviewed: Allergy & Precautions, NPO status , Patient's Chart, lab work & pertinent test results  Airway Mallampati: Intubated  TM Distance: >3 FB Neck ROM: Full    Dental no notable dental hx.    Pulmonary neg pulmonary ROS,    breath sounds clear to auscultation + decreased breath sounds      Cardiovascular negative cardio ROS Normal cardiovascular exam Rhythm:Regular Rate:Normal     Neuro/Psych paraplegic negative psych ROS   GI/Hepatic negative GI ROS, Neg liver ROS,   Endo/Other  negative endocrine ROS  Renal/GU negative Renal ROS  negative genitourinary   Musculoskeletal negative musculoskeletal ROS (+)   Abdominal   Peds negative pediatric ROS (+)  Hematology  (+) anemia ,   Anesthesia Other Findings   Reproductive/Obstetrics negative OB ROS                             Anesthesia Physical Anesthesia Plan  ASA: IV  Anesthesia Plan: General   Post-op Pain Management:    Induction: Intravenous  Airway Management Planned: Oral ETT  Additional Equipment:   Intra-op Plan:   Post-operative Plan: Post-operative intubation/ventilation  Informed Consent: I have reviewed the patients History and Physical, chart, labs and discussed the procedure including the risks, benefits and alternatives for the proposed anesthesia with the patient or authorized representative who has indicated his/her understanding and acceptance.   History available from chart only  Plan Discussed with: CRNA and Surgeon  Anesthesia Plan Comments:         Anesthesia Quick Evaluation

## 2016-03-30 NOTE — Anesthesia Procedure Notes (Signed)
Date/Time: 03/30/2016 1:14 PM Performed by: Lucinda DellECARLO, Luis [name] M Pre-anesthesia Checklist: Patient identified, Suction available, Emergency Drugs available and Patient being monitored Patient Re-evaluated:Patient Re-evaluated prior to inductionOxygen Delivery Method: Circle system utilized Intubation Type: Inhalational induction with existing ETT Placement Confirmation: positive ETCO2 and breath sounds checked- equal and bilateral Tube secured with: Tape
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
## 2016-03-30 NOTE — Brief Op Note (Signed)
03/15/2016 - 03/30/2016  3:07 PM  PATIENT:  Luis [name]  20 y.o. male  PRE-OPERATIVE DIAGNOSIS:  Left anterior frontal sinus fracture  POST-OPERATIVE DIAGNOSIS:  Left anterior frontal sinus fracture  PROCEDURE:  Procedure(s) with comments: OPEN REDUCTION INTERNAL FIXATION (ORIF) LEFT ANTERIOR FRONTAL SINUS FRACTURE (N/A) - ORIF Frontal Sinus  PERCUTANEOUS TRACHEOSTOMY/PEG (Bilateral)  SURGEON:  Surgeon(s) and Role: Panel 1:    * Christia Readingwight Lania Zawistowski, MD - Primary  Panel 2:    * Jimmye NormanJames Wyatt, MD - Primary  PHYSICIAN ASSISTANT:   ASSISTANTS: none   ANESTHESIA:   none  EBL:  Total I/O In: 622.6 [I.V.:167.6; Blood:355; IV Piggyback:100] Out: 385 [Urine:385]  BLOOD ADMINISTERED:none  DRAINS: none   LOCAL MEDICATIONS USED:  LIDOCAINE   SPECIMEN:  No Specimen  DISPOSITION OF SPECIMEN:  N/A  COUNTS:  YES  TOURNIQUET:  * No tourniquets in log *  DICTATION: .Other Dictation: Dictation Number [id number]  PLAN OF CARE: Return to ICU  PATIENT DISPOSITION:  ICU - intubated and hemodynamically stable.   Delay start of Pharmacological VTE agent (>24hrs) due to surgical blood loss or risk of bleeding: no
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
## 2016-03-30 NOTE — Transfer of Care (Signed)
Immediate Anesthesia Transfer of Care Note  Patient: [name]  Procedure(s) Performed: Procedure(s) with comments: OPEN REDUCTION INTERNAL FIXATION (ORIF) LEFT ANTERIOR FRONTAL SINUS FRACTURE (N/A) - ORIF Frontal Sinus  PERCUTANEOUS TRACHEOSTOMY/PEG (Bilateral)  Patient Location: ICU  Anesthesia Type:General  Level of Consciousness: sedated and Patient remains intubated per anesthesia plan  Airway & Oxygen Therapy: Patient remains intubated per anesthesia plan and Patient placed on Ventilator (see vital sign flow sheet for setting)  Post-op Assessment: Report given to RN and Post -op Vital signs reviewed and stable  Post vital signs: Reviewed and stable  Last Vitals:  Vitals:   03/30/16 1215 03/30/16 1600  BP: (!) 99/50   Pulse: 87   Resp: 20   Temp: (!) 38.2 C (!) 38.4 C    Last Pain:  Vitals:   03/30/16 1600  TempSrc: Axillary  PainSc:          Complications: No apparent anesthesia complications

## 2016-03-30 NOTE — Progress Notes (Signed)
Follow up - Trauma and Critical Care  Patient Details:    Luis [name] is an 21 y.o. male.  Lines/tubes : Airway 7.5 mm (Active)  Secured at (cm) 22 cm 03/30/2016  7:45 AM  Measured From [facility] 03/30/2016  7:45 AM  Secured Location Center 03/30/2016  7:45 AM  Secured By [employer] 03/30/2016  7:45 AM  Tube Holder Repositioned Yes 03/30/2016  7:45 AM  Cuff Pressure (cm H2O) 28 cm H2O 03/30/2016  3:20 AM  Site Condition Dry 03/30/2016  7:45 AM     CVC Triple Lumen 03/16/16 Right Subclavian (Active)  Indication for Insertion or Continuance of Line Prolonged intravenous therapies 03/30/2016  8:00 AM  Site Assessment Clean;Dry;Intact 03/30/2016  8:00 AM  Proximal Lumen Status Infusing 03/30/2016  8:00 AM  Medial Lumen Status Capped (Central line) 03/30/2016  8:00 AM  Distal Lumen Status Infusing 03/30/2016  8:00 AM  Dressing Type Transparent 03/30/2016  8:00 AM  Dressing Status Clean;Dry;Intact;Antimicrobial disc in place 03/30/2016  8:00 AM  Line Care Connections checked and tightened;Zeroed and calibrated;Leveled;Distal tubing changed 03/30/2016  8:00 AM  Dressing Intervention New dressing;Dressing changed;Antimicrobial disc changed 03/27/2016  1:00 AM  Dressing Change Due 04/03/16 03/30/2016  8:00 AM     Chest Tube Right (Active)  Suction To water seal 03/30/2016  8:00 AM  Chest Tube Air Leak None 03/30/2016  8:00 AM  Patency Intervention Milked;Tip/tilt 03/30/2016  8:00 AM  Drainage Description Serosanguineous 03/30/2016  8:00 AM  Dressing Status Clean;Dry;Intact 03/30/2016  8:00 AM  Dressing Intervention Dressing changed 03/27/2016  8:00 PM  Site Assessment Clean;Intact 03/30/2016  8:00 AM  Surrounding Skin Unable to view 03/30/2016  8:00 AM  Output (mL) 25 mL 03/29/2016  6:00 PM     Chest Tube 3 Left;Anterior Pleural (Active)  Suction To water seal 03/30/2016  8:00 AM  Chest Tube Air Leak None 03/30/2016  8:00 AM  Patency Intervention Milked;Tip/tilt 03/30/2016  8:00 AM  Drainage Description  Serosanguineous 03/30/2016  8:00 AM  Dressing Status Clean;Dry;Intact 03/30/2016  8:00 AM  Dressing Intervention Dressing reinforced 03/27/2016  8:00 PM  Site Assessment Clean;Dry;Intact 03/30/2016  8:00 AM  Surrounding Skin Unable to view 03/30/2016  8:00 AM  Output (mL) 20 mL 03/29/2016  6:00 PM     Chest Tube Left (Active)  Suction To water seal 03/30/2016  8:00 AM  Chest Tube Air Leak None 03/30/2016  8:00 AM  Patency Intervention Milked;Tip/tilt 03/30/2016  8:00 AM  Drainage Description Serous;Yellow 03/30/2016  8:00 AM  Dressing Status Clean;Dry;Intact 03/30/2016  8:00 AM  Dressing Intervention New dressing 03/27/2016  8:00 PM  Site Assessment Clean;Dry;Intact 03/30/2016  8:00 AM  Surrounding Skin Unable to view 03/30/2016  8:00 AM  Output (mL) 40 mL 03/29/2016  6:00 PM     NG/OG Tube Orogastric 18 Fr. Right mouth Xray Measured external length of tube (Active)  Site Assessment Clean;Intact 03/30/2016  8:00 AM  Ongoing Placement Verification Auscultation 03/30/2016  8:00 AM  Status Infusing tube feed 03/30/2016  8:00 AM  Drainage Appearance Bloody;Brown 03/30/2016  8:00 AM  Intake (mL) 60 mL 03/28/2016  4:00 AM  Output (mL) 0 mL 03/27/2016  8:00 PM     Rectal Tube/Pouch (Active)  Output (mL) 400 mL 03/30/2016  5:00 AM  Intake (mL) 0 mL 03/27/2016  8:00 PM     Urethral Catheter Aborst RN  Straight-tip 16 Fr. (Active)  Indication for Insertion or Continuance of Catheter Unstable spinal/crush injuries;Aggressive IV diuresis;Peri-operative use for selective surgical  procedure 03/30/2016  8:00 AM  Site Assessment Clean;Intact 03/30/2016  8:00 AM  Catheter Maintenance Bag below level of bladder;Catheter secured;Drainage bag/tubing not touching floor;Seal intact;No dependent loops;Insertion date on drainage bag 03/30/2016  8:00 AM  Collection Container Standard drainage bag 03/30/2016  8:00 AM  Securement Method Securing device (Describe) 03/30/2016  8:00 AM  Urinary Catheter Interventions Unclamped 03/30/2016  8:00 AM  Output  (mL) 800 mL 03/30/2016  6:00 AM    Microbiology/Sepsis markers: Results for orders placed or performed during the hospital encounter of 03/15/16  MRSA culture     Status: None   Collection Time: 03/17/16  1:39 PM  Result Value Ref Range Status   Specimen Description NASOPHARYNGEAL  Final   Special Requests NONE  Final   Culture MRSA DETECTED  Final   Report Status 03/19/2016 FINAL  Final  Culture, respiratory (NON-Expectorated)     Status: None   Collection Time: 03/18/16 11:12 AM  Result Value Ref Range Status   Specimen Description TRACHEAL ASPIRATE  Final   Special Requests Normal  Final   Gram Stain   Final    ABUNDANT WBC PRESENT, PREDOMINANTLY PMN RARE SQUAMOUS EPITHELIAL CELLS PRESENT ABUNDANT GRAM POSITIVE COCCI IN PAIRS IN CLUSTERS MODERATE GRAM NEGATIVE RODS    Culture ABUNDANT STAPHYLOCOCCUS AUREUS  Final   Report Status 03/20/2016 FINAL  Final   Organism ID, Bacteria STAPHYLOCOCCUS AUREUS  Final      Susceptibility   Staphylococcus aureus - MIC*    CIPROFLOXACIN <=0.5 SENSITIVE Sensitive     ERYTHROMYCIN 0.5 SENSITIVE Sensitive     GENTAMICIN <=0.5 SENSITIVE Sensitive     OXACILLIN <=0.25 SENSITIVE Sensitive     TETRACYCLINE <=1 SENSITIVE Sensitive     VANCOMYCIN 1 SENSITIVE Sensitive     TRIMETH/SULFA <=10 SENSITIVE Sensitive     CLINDAMYCIN <=0.25 SENSITIVE Sensitive     RIFAMPIN <=0.5 SENSITIVE Sensitive     Inducible Clindamycin NEGATIVE Sensitive     * ABUNDANT STAPHYLOCOCCUS AUREUS  Culture, Urine     Status: None   Collection Time: 03/18/16  1:19 PM  Result Value Ref Range Status   Specimen Description URINE, CATHETERIZED  Final   Special Requests Normal  Final   Culture NO GROWTH  Final   Report Status 03/19/2016 FINAL  Final  Culture, blood (Routine X 2) w Reflex to ID Panel     Status: Abnormal   Collection Time: 03/18/16  1:35 PM  Result Value Ref Range Status   Specimen Description BLOOD LEFT HAND  Final   Special Requests BOTTLES DRAWN AEROBIC  ONLY 8CC  Final   Culture  Setup Time   Final    GRAM POSITIVE COCCI IN CLUSTERS AEROBIC BOTTLE ONLY CRITICAL RESULT CALLED TO, READ BACK BY AND VERIFIED WITH: J MILLEN,PHARMD AT 1334 03/19/16 BY L BENFIELD    Culture (A)  Final    STAPHYLOCOCCUS SPECIES (COAGULASE NEGATIVE) THE SIGNIFICANCE OF ISOLATING THIS ORGANISM FROM A SINGLE SET OF BLOOD CULTURES WHEN MULTIPLE SETS ARE DRAWN IS UNCERTAIN. PLEASE NOTIFY THE MICROBIOLOGY DEPARTMENT WITHIN ONE WEEK IF SPECIATION AND SENSITIVITIES ARE REQUIRED.    Report Status 03/21/2016 FINAL  Final  Blood Culture ID Panel (Reflexed)     Status: Abnormal   Collection Time: 03/18/16  1:35 PM  Result Value Ref Range Status   Enterococcus species NOT DETECTED NOT DETECTED Final   Listeria monocytogenes NOT DETECTED NOT DETECTED Final   Staphylococcus species DETECTED (A) NOT DETECTED Final    Comment: CRITICAL RESULT CALLED  TO, READ BACK BY AND VERIFIED WITH: J MILLEN,PHARMD AT 1334 03/19/16 BY L BENFIELD    Staphylococcus aureus NOT DETECTED NOT DETECTED Final   Methicillin resistance NOT DETECTED NOT DETECTED Final   Streptococcus species NOT DETECTED NOT DETECTED Final   Streptococcus agalactiae NOT DETECTED NOT DETECTED Final   Streptococcus pneumoniae NOT DETECTED NOT DETECTED Final   Streptococcus pyogenes NOT DETECTED NOT DETECTED Final   Acinetobacter baumannii NOT DETECTED NOT DETECTED Final   Enterobacteriaceae species NOT DETECTED NOT DETECTED Final   Enterobacter cloacae complex NOT DETECTED NOT DETECTED Final   Escherichia coli NOT DETECTED NOT DETECTED Final   Klebsiella oxytoca NOT DETECTED NOT DETECTED Final   Klebsiella pneumoniae NOT DETECTED NOT DETECTED Final   Proteus species NOT DETECTED NOT DETECTED Final   Serratia marcescens NOT DETECTED NOT DETECTED Final   Haemophilus influenzae NOT DETECTED NOT DETECTED Final   Neisseria meningitidis NOT DETECTED NOT DETECTED Final   Pseudomonas aeruginosa NOT DETECTED NOT DETECTED  Final   Candida albicans NOT DETECTED NOT DETECTED Final   Candida glabrata NOT DETECTED NOT DETECTED Final   Candida krusei NOT DETECTED NOT DETECTED Final   Candida parapsilosis NOT DETECTED NOT DETECTED Final   Candida tropicalis NOT DETECTED NOT DETECTED Final  Culture, blood (Routine X 2) w Reflex to ID Panel     Status: None   Collection Time: 03/18/16  3:17 PM  Result Value Ref Range Status   Specimen Description BLOOD LEFT ARM  Final   Special Requests BOTTLES DRAWN AEROBIC AND ANAEROBIC 5CC  Final   Culture NO GROWTH 5 DAYS  Final   Report Status 03/23/2016 FINAL  Final  MRSA PCR Screening     Status: None   Collection Time: 03/19/16  1:52 PM  Result Value Ref Range Status   MRSA by PCR NEGATIVE NEGATIVE Final    Comment:        The GeneXpert MRSA Assay (FDA approved for NASAL specimens only), is one component of a comprehensive MRSA colonization surveillance program. It is not intended to diagnose MRSA infection nor to guide or monitor treatment for MRSA infections.   Culture, respiratory (NON-Expectorated)     Status: None   Collection Time: 03/22/16 12:04 PM  Result Value Ref Range Status   Specimen Description TRACHEAL ASPIRATE  Final   Special Requests TRACHEAL ASPIRATE  Final   Gram Stain   Final    FEW WBC PRESENT, PREDOMINANTLY PMN RARE GRAM POSITIVE COCCI IN PAIRS RARE GRAM NEGATIVE COCCI IN PAIRS    Culture MODERATE STAPHYLOCOCCUS AUREUS  Final   Report Status 03/24/2016 FINAL  Final   Organism ID, Bacteria STAPHYLOCOCCUS AUREUS  Final      Susceptibility   Staphylococcus aureus - MIC*    CIPROFLOXACIN <=0.5 SENSITIVE Sensitive     ERYTHROMYCIN 0.5 SENSITIVE Sensitive     GENTAMICIN <=0.5 SENSITIVE Sensitive     OXACILLIN <=0.25 SENSITIVE Sensitive     TETRACYCLINE <=1 SENSITIVE Sensitive     VANCOMYCIN 1 SENSITIVE Sensitive     TRIMETH/SULFA <=10 SENSITIVE Sensitive     CLINDAMYCIN <=0.25 SENSITIVE Sensitive     RIFAMPIN <=0.5 SENSITIVE Sensitive      Inducible Clindamycin NEGATIVE Sensitive     * MODERATE STAPHYLOCOCCUS AUREUS  Culture, body fluid-bottle     Status: None (Preliminary result)   Collection Time: 03/27/16  5:08 PM  Result Value Ref Range Status   Specimen Description PLEURAL LEFT  Final   Special Requests BOTTLES DRAWN AEROBIC AND  ANAEROBIC 10CC  Final   Culture NO GROWTH 2 DAYS  Final   Report Status PENDING  Incomplete  Gram stain     Status: None   Collection Time: 03/27/16  5:08 PM  Result Value Ref Range Status   Specimen Description PLEURAL LEFT  Final   Special Requests NONE  Final   Gram Stain   Final    ABUNDANT WBC PRESENT,BOTH PMN AND MONONUCLEAR NO ORGANISMS SEEN    Report Status 03/27/2016 FINAL  Final  Culture, respiratory (NON-Expectorated)     Status: None (Preliminary result)   Collection Time: 03/28/16  3:39 AM  Result Value Ref Range Status   Specimen Description TRACHEAL ASPIRATE  Final   Special Requests NONE  Final   Gram Stain   Final    ABUNDANT WBC PRESENT, PREDOMINANTLY PMN MODERATE GRAM POSITIVE COCCI IN PAIRS IN CHAINS FEW GRAM NEGATIVE RODS RARE GRAM NEGATIVE COCCI IN PAIRS    Culture   Final    MODERATE SERRATIA MARCESCENS SUSCEPTIBILITIES TO FOLLOW    Report Status PENDING  Incomplete  Culture, blood (routine x 2)     Status: None (Preliminary result)   Collection Time: 03/28/16  7:07 AM  Result Value Ref Range Status   Specimen Description BLOOD LEFT ANTECUBITAL  Final   Special Requests BOTTLES DRAWN AEROBIC AND ANAEROBIC 5CC  Final   Culture  Setup Time   Final    GRAM POSITIVE COCCI IN CLUSTERS IN BOTH AEROBIC AND ANAEROBIC BOTTLES CRITICAL VALUE NOTED.  VALUE IS CONSISTENT WITH PREVIOUSLY REPORTED AND CALLED VALUE.    Culture GRAM POSITIVE COCCI  Final   Report Status PENDING  Incomplete  Culture, blood (routine x 2)     Status: None (Preliminary result)   Collection Time: 03/28/16  7:10 AM  Result Value Ref Range Status   Specimen Description BLOOD RIGHT  ANTECUBITAL  Final   Special Requests BOTTLES DRAWN AEROBIC AND ANAEROBIC 5CC  Final   Culture  Setup Time   Final    GRAM POSITIVE COCCI IN CLUSTERS IN BOTH AEROBIC AND ANAEROBIC BOTTLES CRITICAL RESULT CALLED TO, READ BACK BY AND VERIFIED WITH: K AMEND 03/29/16 @ 4098 Judie Petit VESTAL    Culture GRAM POSITIVE COCCI  Final   Report Status PENDING  Incomplete    Anti-infectives:  Anti-infectives    Start     Dose/Rate Route Frequency Ordered Stop   03/28/16 0400  Ampicillin-Sulbactam (UNASYN) 3 g in sodium chloride 0.9 % 100 mL IVPB     3 g 200 mL/hr over 30 Minutes Intravenous Every 6 hours 03/28/16 0343     03/21/16 1200  Ampicillin-Sulbactam (UNASYN) 3 g in sodium chloride 0.9 % 100 mL IVPB     3 g 200 mL/hr over 30 Minutes Intravenous Every 6 hours 03/21/16 1053 03/25/16 0654   03/19/16 2200  vancomycin (VANCOCIN) IVPB 1000 mg/200 mL premix  Status:  Discontinued     1,000 mg 200 mL/hr over 60 Minutes Intravenous Every 8 hours 03/19/16 1355 03/21/16 1053   03/19/16 1400  piperacillin-tazobactam (ZOSYN) IVPB 3.375 g  Status:  Discontinued     3.375 g 12.5 mL/hr over 240 Minutes Intravenous Every 8 hours 03/19/16 1041 03/21/16 1053   03/19/16 1400  vancomycin (VANCOCIN) 1,500 mg in sodium chloride 0.9 % 500 mL IVPB     1,500 mg 250 mL/hr over 120 Minutes Intravenous  Once 03/19/16 1355 03/19/16 1638      Best Practice/Protocols:  VTE Prophylaxis: Mechanical GI Prophylaxis: Proton Pump  Inhibitor Intermittent Sedation  Consults: Treatment Team:  Lisbeth RenshawNeelesh Nundkumar, MD Yolonda KidaJason Patrick Rogers, MD    Events:  Subjective:    Overnight Issues: Did well overnight.  Objective:  Vital signs for last 24 hours: Temp:  [98.9 F (37.2 C)-101.2 F (38.4 C)] 100.2 F (37.9 C) (01/03 0800) Pulse Rate:  [82-139] 97 (01/03 0800) Resp:  [20-26] 20 (01/03 0800) BP: (85-133)/(41-75) 97/47 (01/03 0800) SpO2:  [96 %-100 %] 98 % (01/03 0800) FiO2 (%):  [40 %] 40 % (01/03 0745) Weight:  [81.7  kg (180 lb 1.9 oz)] 81.7 kg (180 lb 1.9 oz) (01/03 0443)  Hemodynamic parameters for last 24 hours: CVP:  [10 mmHg-20 mmHg] 11 mmHg  Intake/Output from previous day: 01/02 0701 - 01/03 0700 In: 2473.9 [I.V.:1713.9; NG/GT:360; IV Piggyback:400] Out: 2335 [Urine:1850; Stool:400; Chest Tube:85]  Intake/Output this shift: Total I/O In: 69.2 [I.V.:69.2] Out: -   Vent settings for last 24 hours: Vent Mode: PRVC FiO2 (%):  [40 %] 40 % Set Rate:  [20 bmp] 20 bmp Vt Set:  [570 mL] 570 mL PEEP:  [5 cmH20] 5 cmH20 Plateau Pressure:  [14 cmH20-21 cmH20] 19 cmH20  Physical Exam:  General: alert and no respiratory distress Neuro: alert, oriented and nonfocal exam Resp: clear to auscultation bilaterally and CXR is pending CVS: regular rate and rhythm, S1, S2 normal, no murmur, click, rub or gallop, IRR and intermittent tachycardia GI: soft, nontender, BS WNL, no r/g and tube feedings are off Extremities: no edema, no erythema, pulses WNL and No changes  Results for orders placed or performed during the hospital encounter of 03/15/16 (from the past 24 hour(s))  Triglycerides     Status: Abnormal   Collection Time: 03/30/16  4:30 AM  Result Value Ref Range   Triglycerides 256 (H) <150 mg/dL  Basic metabolic panel     Status: Abnormal   Collection Time: 03/30/16  4:30 AM  Result Value Ref Range   Sodium 139 135 - 145 mmol/L   Potassium 3.8 3.5 - 5.1 mmol/L   Chloride 103 101 - 111 mmol/L   CO2 27 22 - 32 mmol/L   Glucose, Bld 97 65 - 99 mg/dL   BUN 21 (H) 6 - 20 mg/dL   Creatinine, Ser 1.610.64 0.61 - 1.24 mg/dL   Calcium 8.1 (L) 8.9 - 10.3 mg/dL   GFR calc non Af Amer >60 >60 mL/min   GFR calc Af Amer >60 >60 mL/min   Anion gap 9 5 - 15  CBC     Status: Abnormal   Collection Time: 03/30/16  4:30 AM  Result Value Ref Range   WBC 8.5 4.0 - 10.5 K/uL   RBC 2.50 (L) 4.22 - 5.81 MIL/uL   Hemoglobin 7.1 (L) 13.0 - 17.0 g/dL   HCT 09.622.2 (L) 04.539.0 - 40.952.0 %   MCV 88.8 78.0 - 100.0 fL   MCH  28.4 26.0 - 34.0 pg   MCHC 32.0 30.0 - 36.0 g/dL   RDW 81.114.3 91.411.5 - 78.215.5 %   Platelets 438 (H) 150 - 400 K/uL  Magnesium     Status: None   Collection Time: 03/30/16  4:30 AM  Result Value Ref Range   Magnesium 1.9 1.7 - 2.4 mg/dL  Phosphorus     Status: None   Collection Time: 03/30/16  4:30 AM  Result Value Ref Range   Phosphorus 4.5 2.5 - 4.6 mg/dL     Assessment/Plan:   NEURO  Altered Mental Status:  awake and appropriate  Plan: CPM  PULM  Atelectasis/collapse (bibasilar) Chest Wall Trauma rib fractures, Lung Trauma (right, left, with contusion of lung, with laceration of lung, with pleural effusion and hemothorax) and Pneumothorax (traumatic)   Plan: Keep chest tubes for now and get CXr  CARDIO  Sinus Tachycardia   Plan: No specific treatment  RENAL  Urine output and renal function are good   Plan: CPM  GI  No specific issues   Plan: CPM  ID  Pneumonia (hospital acquired (not ventilator-associated) Staph pneumonia being treated.)   Plan: CPM  HEME  Anemia acute blood loss anemia)   Plan: Getting one unit of blood in preparation for the OR  ENDO No specific issues   Plan: CPM  Global Issues  Patient to go to the OR today for facial fixation, trach, PEG.  Otherwise has stabilized significantly.  Will probably have surgery on his back sometime this wee or early next week.     LOS: 15 days   Additional comments:I reviewed the patient's new clinical lab test results. cbc/bmet and I reviewed the patients new imaging test results. cxr is pending  Critical Care Total Time*: 30 Minutes  Kamalei Roeder 03/30/2016  *Care during the described time interval was provided by me and/or other providers on the critical care team.  I have reviewed this patient's available data, including medical history, events of note, physical examination and test results as part of my evaluation.

## 2016-03-31 ENCOUNTER — Encounter (HOSPITAL_COMMUNITY): Payer: Self-pay | Admitting: Anesthesiology

## 2016-03-31 ENCOUNTER — Encounter (HOSPITAL_COMMUNITY): Payer: Self-pay | Admitting: General Surgery

## 2016-03-31 ENCOUNTER — Encounter (HOSPITAL_COMMUNITY): Admission: EM | Disposition: A | Discharge status: Rehabilitation Facility | Payer: Self-pay | Source: Home / Self Care

## 2016-03-31 ENCOUNTER — Inpatient Hospital Stay (HOSPITAL_COMMUNITY): Payer: Medicaid Other

## 2016-03-31 LAB — CULTURE, BLOOD (ROUTINE X 2)

## 2016-03-31 LAB — CBC WITH DIFFERENTIAL/PLATELET
Basophils Absolute: 0.1 10*3/uL (ref 0.0–0.1)
Basophils Relative: 1 %
Eosinophils Absolute: 0.4 10*3/uL (ref 0.0–0.7)
Eosinophils Relative: 4 %
HEMATOCRIT: 24.9 % — AB (ref 39.0–52.0)
HEMOGLOBIN: 8 g/dL — AB (ref 13.0–17.0)
LYMPHS ABS: 1.5 10*3/uL (ref 0.7–4.0)
LYMPHS PCT: 19 %
MCH: 28 pg (ref 26.0–34.0)
MCHC: 32.1 g/dL (ref 30.0–36.0)
MCV: 87.1 fL (ref 78.0–100.0)
MONOS PCT: 14 %
Monocytes Absolute: 1.1 10*3/uL — ABNORMAL HIGH (ref 0.1–1.0)
NEUTROS ABS: 5.2 10*3/uL (ref 1.7–7.7)
NEUTROS PCT: 62 %
Platelets: 427 10*3/uL — ABNORMAL HIGH (ref 150–400)
RBC: 2.86 MIL/uL — ABNORMAL LOW (ref 4.22–5.81)
RDW: 14.8 % (ref 11.5–15.5)
WBC: 8.2 10*3/uL (ref 4.0–10.5)

## 2016-03-31 LAB — CULTURE, RESPIRATORY

## 2016-03-31 LAB — CULTURE, RESPIRATORY W GRAM STAIN

## 2016-03-31 LAB — BASIC METABOLIC PANEL
Anion gap: 7 (ref 5–15)
BUN: 15 mg/dL (ref 6–20)
CHLORIDE: 103 mmol/L (ref 101–111)
CO2: 26 mmol/L (ref 22–32)
CREATININE: 0.55 mg/dL — AB (ref 0.61–1.24)
Calcium: 7.9 mg/dL — ABNORMAL LOW (ref 8.9–10.3)
GFR calc Af Amer: >60 mL/min (ref >60)
GFR calc non Af Amer: >60 mL/min (ref >60)
Glucose, Bld: 105 mg/dL — ABNORMAL HIGH (ref 65–99)
POTASSIUM: 4 mmol/L (ref 3.5–5.1)
Sodium: 136 mmol/L (ref 135–145)

## 2016-03-31 LAB — TYPE AND SCREEN
Blood Product Expiration Date: 201801072359
ISSUE DATE / TIME: 201801031144
Unit Type and Rh: 9500

## 2016-03-31 SURGERY — POSTERIOR LUMBAR FUSION 2 LEVEL
Anesthesia: General

## 2016-03-31 MED ORDER — BACITRACIN ZINC 500 UNIT/GM EX OINT
TOPICAL_OINTMENT | Freq: Two times a day (BID) | CUTANEOUS | Status: DC
  Administered 2016-03-31 – 2016-04-08 (×16): via TOPICAL
  Administered 2016-04-09 – 2016-04-11 (×3): 1 via TOPICAL
  Administered 2016-04-12 – 2016-04-13 (×3): via TOPICAL
  Administered 2016-04-13: 1 via TOPICAL
  Administered 2016-04-14 – 2016-04-15 (×3): via TOPICAL
  Administered 2016-04-15: 1 via TOPICAL
  Administered 2016-04-16 – 2016-04-19 (×6): via TOPICAL
  Filled 2016-03-31: qty 28.35

## 2016-03-31 MED ORDER — VANCOMYCIN HCL 10 G IV SOLR
1500.0000 mg | Freq: Once | INTRAVENOUS | Status: AC
  Administered 2016-03-31: 1500 mg via INTRAVENOUS
  Filled 2016-03-31: qty 1500

## 2016-03-31 MED ORDER — DEXTROSE 5 % IV SOLN
2.0000 g | INTRAVENOUS | Status: DC
  Administered 2016-03-31 – 2016-04-07 (×8): 2 g via INTRAVENOUS
  Filled 2016-03-31 (×8): qty 2

## 2016-03-31 MED ORDER — VANCOMYCIN HCL IN DEXTROSE 1-5 GM/200ML-% IV SOLN
1000.0000 mg | Freq: Three times a day (TID) | INTRAVENOUS | Status: DC
Start: 2016-03-31 — End: 2016-04-02
  Administered 2016-03-31 – 2016-04-02 (×7): 1000 mg via INTRAVENOUS
  Filled 2016-03-31 (×7): qty 200

## 2016-03-31 NOTE — Progress Notes (Signed)
Informed consent obtained from patient's father for central line placement. Procedure discussed by Dr. Janee Mornhompson. No questions at this time. Continuing to monitor.

## 2016-03-31 NOTE — Progress Notes (Signed)
Pharmacy Antibiotic Note  Luis [name] is a 21 y.o. male admitted on 03/15/2016 with bacteremia.  Pharmacy has been consulted for vancomycin dosing. Patient has been on antibiotics to cover MSSA pneumonia since 12/23 (12 days total) and is now growing CoNS is 2 of 2 blood cultures drawn from opposite sites and serratia on a trach aspirate. Unasyn has been switch to ceftriaxone for serratia coverage and will start vancomycin for CoNS. Will load vancomycin 1500mg  IV once.  Plan: Vancomycin 1g IV every 8 hours.  Goal trough 15-20 mcg/mL.  Ceftriaxone 2g IV q24h Monitor culture data, renal function and clinical course VT at SS prn  Height: 5\' 6"  (167.6 cm) Weight: 180 lb 8.9 oz (81.9 kg) IBW/kg (Calculated) : 63.8  Temp (24hrs), Avg:100.4 F (38 C), Min:99.5 F (37.5 C), Max:101.2 F (38.4 C)   Recent Labs Lab 03/25/16 0500 03/26/16 0500 03/27/16 0359 03/30/16 0430 03/31/16 0500  WBC 9.4 8.2 9.7 8.5 8.2  CREATININE 0.66 0.61 0.60* 0.64 0.55*    Estimated Creatinine Clearance: 147.9 mL/min (by C-G formula based on SCr of 0.55 mg/dL (L)).    Allergies  Allergen Reactions  . No Known Allergies     Antimicrobials this admission: Zosyn 12/23 >>12/25 Vancomycin 12/23 >>12/25, 1/4>> Unasyn 12/25>> 12 /29, 1/1>>1/4  Ceftriaxone 1/4>>  Dose adjustments this admission: n/a  Microbiology results: 12/22 BCx: 1/2 CNS 12/22 BCID: staph species 12/22 UCx: ngf 12/22  Sputum: Abundant staph MSSA 12/21 MRSA nasal culture - detected 12/23 MRSA neg 12/26 Resp Cx: MSSA pan sensitive 12/31 pleura: NGTD 1/1 TA: serratia 1/1 BCx2: CoNS in 2 of 2  Luis [name], PharmD, BCPS Clinical Pharmacist Pager [phone] 03/31/2016 8:15 AM
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
## 2016-03-31 NOTE — Progress Notes (Signed)
Dr. Janee Mornhompson made aware patient T 102.2 (ax) this morning. Also made aware of all CT output for last 24 hrs. Orders received and carried out. Continuing to monitor.
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
## 2016-03-31 NOTE — Procedures (Signed)
Central Venous Catheter Insertion Procedure Note Luis [name] [id number] [birth year]  Procedure: Insertion of Central Venous Catheter Indications: Drug and/or fluid administration Surgeon: Violeta GelinasBurke Lorn Butcher, MD Assist: Bailey MechLiz SImaan, [facility]  Procedure Details Consent: Risks of procedure as well as the alternatives and risks of each were explained to the (patient/caregiver).  Consent for procedure obtained. Time Out: Verified patient identification, verified procedure, site/side was marked, verified correct patient position, special equipment/implants available, medications/allergies/relevent history reviewed, required imaging and test results available.  Performed  Maximum sterile technique was used including antiseptics, cap, gloves, gown, hand hygiene, mask and sheet. Skin prep: Chlorhexidine; local anesthetic administered A antimicrobial bonded/coated triple lumen catheter was placed in the right femoral vein due to bacteremia with R SCV TLC and L clavicle FX, using the Seldinger technique.  Evaluation Blood flow good Complications: No apparent complications Patient did tolerate procedure well.   Luis [name] 03/31/2016, 10:35 AM
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
## 2016-03-31 NOTE — Progress Notes (Signed)
Visited w/ pt's mother in rm, who requested prayer and advised that they were Catholic. Provided spiritual/emotional support and prayer, which she appreciated. Explained that a communion minister came 3x/wk. She is interested in being included on their list to be visited tomorrow -- and that she could ask a nurse to page at any time if she desired a priest to come for her son or if she desired prayer by [facility] chaplains. Chaplain available for f/u.     03/31/16 1200  Clinical Encounter Type  Visited With Patient and family together  Visit Type Initial;Psychological support;Spiritual support;Social support  Referral From Chaplain  Spiritual Encounters  Spiritual Needs Prayer;Emotional  Stress Factors  Patient Stress Factors Health changes;Loss of control  Family Stress Factors Family relationships;Health changes;Loss of control   Luis [name], [address]
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
## 2016-04-01 LAB — CBC
HCT: 25.7 % — ABNORMAL LOW (ref 39.0–52.0)
Hemoglobin: 8.4 g/dL — ABNORMAL LOW (ref 13.0–17.0)
MCH: 28.5 pg (ref 26.0–34.0)
MCHC: 32.7 g/dL (ref 30.0–36.0)
MCV: 87.1 fL (ref 78.0–100.0)
Platelets: 447 10*3/uL — ABNORMAL HIGH (ref 150–400)
RBC: 2.95 MIL/uL — AB (ref 4.22–5.81)
RDW: 14.4 % (ref 11.5–15.5)
WBC: 8.7 10*3/uL (ref 4.0–10.5)

## 2016-04-01 LAB — BASIC METABOLIC PANEL
Anion gap: 9 (ref 5–15)
BUN: 14 mg/dL (ref 6–20)
CO2: 25 mmol/L (ref 22–32)
CREATININE: 0.5 mg/dL — AB (ref 0.61–1.24)
Calcium: 8.1 mg/dL — ABNORMAL LOW (ref 8.9–10.3)
Chloride: 102 mmol/L (ref 101–111)
GFR calc Af Amer: >60 mL/min (ref >60)
Glucose, Bld: 115 mg/dL — ABNORMAL HIGH (ref 65–99)
Potassium: 3.9 mmol/L (ref 3.5–5.1)
SODIUM: 136 mmol/L (ref 135–145)

## 2016-04-01 LAB — CULTURE, BODY FLUID-BOTTLE: CULTURE: NO GROWTH

## 2016-04-01 LAB — GLUCOSE, CAPILLARY: GLUCOSE-CAPILLARY: 104 mg/dL — AB (ref 65–99)

## 2016-04-01 MED ORDER — ENOXAPARIN SODIUM 40 MG/0.4ML ~~LOC~~ SOLN
40.0000 mg | SUBCUTANEOUS | Status: DC
  Administered 2016-04-01 – 2016-04-19 (×18): 40 mg via SUBCUTANEOUS
  Filled 2016-04-01 (×18): qty 0.4

## 2016-04-01 MED ORDER — QUETIAPINE FUMARATE 200 MG PO TABS
200.0000 mg | ORAL_TABLET | Freq: Three times a day (TID) | ORAL | Status: DC
  Administered 2016-04-01 – 2016-04-11 (×33): 200 mg
  Filled 2016-04-01 (×35): qty 1
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
## 2016-04-01 NOTE — Progress Notes (Signed)
When [employer] arrived onto floor to see another pt, nurse advised that they had gone to waiting rm. Sought family to advise them they return to rm if they wished to receive communion -- or would they prefer to do it there?Marland Kitchen. Then seated w/ husband, daughter and her two small sons, pt's mother demurred. Daughter Albin FellingLuce said that her mother now felt she should confess to a priest before receiving communion.. So advised nurse not today. Daughter then came to ask if her mother had to see a  priest at church to do this (no - any priest can hear confessions anywhere). She also shared that her mother felt if she asked a priest to come to  her brother that he (who likes to be called "Montez HagemanJr.") would think he was dying. We discussed how that is not the case today -- instead, the last Catholic sacrament ("of the Sick") is also now given to, e.g., those in the hospital or the elderly, not just the dying.(i.e., it's not just "the last rites."} The Communion ministers then exited an adjacent rm, and I introduced them to MiltonLuce. They also discussed this w/ her, took her cellphone number, and said they'd touch base w/ her before they left today. Chaplain available for f/u.    04/01/16 1200  Clinical Encounter Type  Visited With Family  Visit Type Follow-up;Psychological support;Spiritual support;Social support;Critical Care  Consult/Referral To Chaplain  Spiritual Encounters  Spiritual Needs Ritual;Emotional  Stress Factors  Patient Stress Factors Health changes;Loss of control  Family Stress Factors Family relationships;Health changes;Loss of control   Ephraim Hamburgerynthia A Kelden Lavallee, [address]
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
## 2016-04-02 ENCOUNTER — Inpatient Hospital Stay (HOSPITAL_COMMUNITY): Payer: Medicaid Other

## 2016-04-02 LAB — BASIC METABOLIC PANEL
Anion gap: 7 (ref 5–15)
BUN: 18 mg/dL (ref 6–20)
CALCIUM: 8.2 mg/dL — AB (ref 8.9–10.3)
CO2: 27 mmol/L (ref 22–32)
Chloride: 102 mmol/L (ref 101–111)
Creatinine, Ser: 0.54 mg/dL — ABNORMAL LOW (ref 0.61–1.24)
GFR calc Af Amer: >60 mL/min (ref >60)
GLUCOSE: 104 mg/dL — AB (ref 65–99)
Potassium: 4.1 mmol/L (ref 3.5–5.1)
SODIUM: 136 mmol/L (ref 135–145)

## 2016-04-02 LAB — CBC
HCT: 26.8 % — ABNORMAL LOW (ref 39.0–52.0)
Hemoglobin: 8.7 g/dL — ABNORMAL LOW (ref 13.0–17.0)
MCH: 28.2 pg (ref 26.0–34.0)
MCHC: 32.5 g/dL (ref 30.0–36.0)
MCV: 87 fL (ref 78.0–100.0)
PLATELETS: 445 10*3/uL — AB (ref 150–400)
RBC: 3.08 MIL/uL — ABNORMAL LOW (ref 4.22–5.81)
RDW: 14.2 % (ref 11.5–15.5)
WBC: 7.9 10*3/uL (ref 4.0–10.5)

## 2016-04-02 LAB — VANCOMYCIN, TROUGH: Vancomycin Tr: 11 ug/mL — ABNORMAL LOW (ref 15–20)

## 2016-04-02 LAB — TRIGLYCERIDES: Triglycerides: 197 mg/dL — ABNORMAL HIGH (ref <150)

## 2016-04-02 MED ORDER — CLONAZEPAM 1 MG PO TABS
2.0000 mg | ORAL_TABLET | Freq: Three times a day (TID) | ORAL | Status: DC
Start: 2016-04-02 — End: 2016-04-12
  Administered 2016-04-02 – 2016-04-12 (×30): 2 mg
  Filled 2016-04-02 (×31): qty 2

## 2016-04-02 MED ORDER — METOPROLOL TARTRATE 5 MG/5ML IV SOLN: 5.0000 mg | Freq: Four times a day (QID) | INTRAVENOUS | Status: DC

## 2016-04-02 MED ORDER — LORAZEPAM 2 MG/ML IJ SOLN
2.0000 mg | INTRAMUSCULAR | Status: DC | PRN
  Administered 2016-04-02 – 2016-04-12 (×7): 2 mg via INTRAVENOUS
  Filled 2016-04-02 (×6): qty 1

## 2016-04-02 MED ORDER — WHITE PETROLATUM GEL
Status: AC
  Administered 2016-04-02: 11:00:00
  Filled 2016-04-02: qty 1

## 2016-04-02 MED ORDER — METOPROLOL TARTRATE 5 MG/5ML IV SOLN
5.0000 mg | Freq: Four times a day (QID) | INTRAVENOUS | Status: DC | PRN
  Administered 2016-04-02 – 2016-04-06 (×3): 5 mg via INTRAVENOUS
  Filled 2016-04-02 (×3): qty 5

## 2016-04-02 MED ORDER — METOPROLOL TARTRATE 5 MG/5ML IV SOLN
5.0000 mg | Freq: Four times a day (QID) | INTRAVENOUS | Status: DC | PRN
  Administered 2016-04-02: 5 mg via INTRAVENOUS

## 2016-04-02 MED ORDER — VANCOMYCIN HCL 10 G IV SOLR
1250.0000 mg | Freq: Three times a day (TID) | INTRAVENOUS | Status: DC
  Administered 2016-04-02 – 2016-04-04 (×6): 1250 mg via INTRAVENOUS
  Filled 2016-04-02 (×7): qty 1250

## 2016-04-02 MED ORDER — LORAZEPAM 2 MG/ML IJ SOLN
INTRAMUSCULAR | Status: AC
  Filled 2016-04-02: qty 1

## 2016-04-02 MED ORDER — METOPROLOL TARTRATE 5 MG/5ML IV SOLN
INTRAVENOUS | Status: AC
  Filled 2016-04-02: qty 5

## 2016-04-02 NOTE — Progress Notes (Signed)
End of Shift evaluation:  Weaned pt off of sedation today and after getting ok with Dr. Janee Mornhompson, placed pt on tct fio2 30% around 1200.  Pt has tolerated tct all day.  Expectorating secretions with some assistance with tracheal suction.  Respiratory rate, o2 sat, and pts appearance demonstrate pt doing well respiratory wise.  However, pt still has tachycardia.  Pt expresses that he feels very anxious.  Has received prn dose of metoprolol iv x 1.  New orders given this shift to dc propofol and use prn ativan iv.  Pt has no s/s of any acute distress or c/o anything other than anxiety.

## 2016-04-02 NOTE — Progress Notes (Signed)
Pharmacy Antibiotic Note  Luis [name] is a 21 y.o. male admitted on 03/15/2016 with bacteremia.  Pharmacy has been consulted for vancomycin dosing. Patient has been on antibiotics to cover MSSA pneumonia since 12/23 (12 days total) and is now growing CoNS is 2 of 2 blood cultures drawn from opposite sites and serratia on a trach aspirate. Unasyn has been switch to ceftriaxone for serratia coverage and started on vancomycin for CoNS. A vancomycin trough was drawn today and is low at 11.   Plan: Change vancomycin to 1250mg  IV Q8H Ceftriaxone 2g IV q24h Monitor culture data, renal function and clinical course VT at SS prn  Height: 5\' 6"  (167.6 cm) Weight: 183 lb 13.8 oz (83.4 kg) IBW/kg (Calculated) : 63.8  Temp (24hrs), Avg:99.9 F (37.7 C), Min:99.5 F (37.5 C), Max:100.8 F (38.2 C)   Recent Labs Lab 03/27/16 0359 03/30/16 0430 03/31/16 0500 04/01/16 0530 04/02/16 0454 04/02/16 1615  WBC 9.7 8.5 8.2 8.7 7.9  --   CREATININE 0.60* 0.64 0.55* 0.50* 0.54*  --   VANCOTROUGH  --   --   --   --   --  11*    Estimated Creatinine Clearance: 149.2 mL/min (by C-G formula based on SCr of 0.54 mg/dL (L)).    Allergies  Allergen Reactions  . No Known Allergies     Antimicrobials this admission: Zosyn 12/23 >>12/25 Vancomycin 12/23 >>12/25, 1/4>> Unasyn 12/25>> 12 /29, 1/1>>1/4  Ceftriaxone 1/4>>  Dose adjustments this admission: n/a  Microbiology results: 12/22 BCx: 1/2 CNS 12/22 BCID: staph species 12/22 UCx: ngf 12/22  Sputum: Abundant staph MSSA 12/21 MRSA nasal culture - detected 12/23 MRSA neg 12/26 Resp Cx: MSSA pan sensitive 12/31 pleura: NGTD 1/1 TA: serratia 1/1 BCx2: CoNS in 2 of 2  Lysle Pearlachel Lashante Fryberger, PharmD, BCPS Pager # [phone] 04/02/2016 5:14 PM

## 2016-04-02 NOTE — Progress Notes (Signed)
Pt seen and examined. No issues overnight.  EXAM: Temp:  [98.7 F (37.1 C)-100.1 F (37.8 C)] 99.5 F (37.5 C) (01/06 0800) Pulse Rate:  [73-138] 120 (01/06 0832) Resp:  [14-36] 36 (01/06 0832) BP: (79-134)/(41-85) 134/80 (01/06 0832) SpO2:  [94 %-100 %] 96 % (01/06 0832) FiO2 (%):  [30 %] 30 % (01/06 0832) Weight:  [83.4 kg (183 lb 13.8 oz)] 83.4 kg (183 lb 13.8 oz) (01/06 0430) Intake/Output      01/05 0701 - 01/06 0700 01/06 0701 - 01/07 0700   P.O. 0    I.V. (mL/kg) 1535.2 (18.4)    NG/GT 720    IV Piggyback 650    Total Intake(mL/kg) 2905.2 (34.8)    Urine (mL/kg/hr) 2500 (1.2)    Chest Tube 25 (0)    Total Output 2525     Net +380.2           Awake and alert Follows commands BUE Full strength in upper extremities Plan for spine stabilization after bacteremia clears
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
## 2016-04-02 NOTE — Progress Notes (Signed)
Pt HR has been elevated throughout shift up to 150s at times.  Schedule medication and PRN given.  MD notify.  No new orders at this time, will continue to monitor pt.

## 2016-04-03 ENCOUNTER — Inpatient Hospital Stay (HOSPITAL_COMMUNITY): Payer: Medicaid Other

## 2016-04-03 LAB — BASIC METABOLIC PANEL
ANION GAP: 8 (ref 5–15)
BUN: 15 mg/dL (ref 6–20)
CALCIUM: 8 mg/dL — AB (ref 8.9–10.3)
CO2: 25 mmol/L (ref 22–32)
CREATININE: 0.54 mg/dL — AB (ref 0.61–1.24)
Chloride: 107 mmol/L (ref 101–111)
Glucose, Bld: 122 mg/dL — ABNORMAL HIGH (ref 65–99)
Potassium: 3.8 mmol/L (ref 3.5–5.1)
SODIUM: 140 mmol/L (ref 135–145)

## 2016-04-03 LAB — CBC
HCT: 28.5 % — ABNORMAL LOW (ref 39.0–52.0)
Hemoglobin: 9.2 g/dL — ABNORMAL LOW (ref 13.0–17.0)
MCH: 28.2 pg (ref 26.0–34.0)
MCHC: 32.3 g/dL (ref 30.0–36.0)
MCV: 87.4 fL (ref 78.0–100.0)
Platelets: 464 10*3/uL — ABNORMAL HIGH (ref 150–400)
RBC: 3.26 MIL/uL — ABNORMAL LOW (ref 4.22–5.81)
RDW: 14.3 % (ref 11.5–15.5)
WBC: 11.8 10*3/uL — AB (ref 4.0–10.5)

## 2016-04-03 MED ORDER — SODIUM CHLORIDE 0.9 % IV SOLN
25.0000 ug/h | INTRAVENOUS | Status: DC
  Administered 2016-04-03 – 2016-04-04 (×2): 100 ug/h via INTRAVENOUS
  Administered 2016-04-05 – 2016-04-08 (×3): 75 ug/h via INTRAVENOUS
  Filled 2016-04-03 (×5): qty 50

## 2016-04-03 MED ORDER — DEXMEDETOMIDINE HCL IN NACL 200 MCG/50ML IV SOLN: 0.4000 ug/kg/h | INTRAVENOUS | Status: DC
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
## 2016-04-03 NOTE — Progress Notes (Signed)
Pt seen and examined. No issues overnight.  EXAM: Temp:  [97.7 F (36.5 C)-101.8 F (38.8 C)] 99.4 F (37.4 C) (01/07 0800) Pulse Rate:  [104-154] 104 (01/07 0900) Resp:  [0-49] 36 (01/07 0900) BP: (123-153)/(64-92) 124/85 (01/07 0900) SpO2:  [96 %-100 %] 97 % (01/07 0900) FiO2 (%):  [30 %] 30 % (01/07 0800) Weight:  [81.5 kg (179 lb 10.8 oz)] 81.5 kg (179 lb 10.8 oz) (01/07 0500) Intake/Output      01/06 0701 - 01/07 0700 01/07 0701 - 01/08 0700   P.O. 0    I.V. (mL/kg) 392.4 (4.8) 20 (0.2)   NG/GT 720 60   IV Piggyback 950 50   Total Intake(mL/kg) 2062.4 (25.3) 130 (1.6)   Urine (mL/kg/hr) 5025 (2.6) 385 (1.4)   Chest Tube     Total Output 5025 385   Net -2962.6 -255         Awake and alert Follows commands BUE Paraplegic  Stable Continue current care

## 2016-04-04 ENCOUNTER — Other Ambulatory Visit: Payer: Self-pay | Admitting: Neurosurgery

## 2016-04-04 ENCOUNTER — Inpatient Hospital Stay (HOSPITAL_COMMUNITY): Payer: Medicaid Other

## 2016-04-04 LAB — BASIC METABOLIC PANEL
ANION GAP: 7 (ref 5–15)
BUN: 21 mg/dL — ABNORMAL HIGH (ref 6–20)
CALCIUM: 8.5 mg/dL — AB (ref 8.9–10.3)
CO2: 25 mmol/L (ref 22–32)
Chloride: 108 mmol/L (ref 101–111)
Creatinine, Ser: 0.49 mg/dL — ABNORMAL LOW (ref 0.61–1.24)
GLUCOSE: 103 mg/dL — AB (ref 65–99)
Potassium: 3.7 mmol/L (ref 3.5–5.1)
Sodium: 140 mmol/L (ref 135–145)

## 2016-04-04 LAB — CBC
HCT: 27 % — ABNORMAL LOW (ref 39.0–52.0)
Hemoglobin: 8.7 g/dL — ABNORMAL LOW (ref 13.0–17.0)
MCH: 28.6 pg (ref 26.0–34.0)
MCHC: 32.2 g/dL (ref 30.0–36.0)
MCV: 88.8 fL (ref 78.0–100.0)
PLATELETS: 486 10*3/uL — AB (ref 150–400)
RBC: 3.04 MIL/uL — ABNORMAL LOW (ref 4.22–5.81)
RDW: 14.7 % (ref 11.5–15.5)
WBC: 8.4 10*3/uL (ref 4.0–10.5)

## 2016-04-04 LAB — VANCOMYCIN, TROUGH: Vancomycin Tr: 14 ug/mL — ABNORMAL LOW (ref 15–20)

## 2016-04-04 MED ORDER — VANCOMYCIN HCL 10 G IV SOLR
1500.0000 mg | Freq: Three times a day (TID) | INTRAVENOUS | Status: DC
  Administered 2016-04-04 – 2016-04-06 (×7): 1500 mg via INTRAVENOUS
  Filled 2016-04-04 (×8): qty 1500

## 2016-04-04 NOTE — Progress Notes (Signed)
Visited w/ pt's sister in rm, while their mom was out in waiting rm w/ sister's small son. Sister conveyed good news that pt is making progress medically. Provided emotional support and prayer of thanksgiving. Chaplain available for f/u.    04/04/16 1400  Clinical Encounter Type  Visited With Family  Visit Type Follow-up;Psychological support;Spiritual support;Social support;Critical Care  Referral From Chaplain  Spiritual Encounters  Spiritual Needs Emotional  Stress Factors  Patient Stress Factors Health changes;Loss of control  Family Stress Factors Family relationships;Health changes   Ephraim Hamburgerynthia A Anani Gu, Chaplain
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
## 2016-04-05 LAB — POCT I-STAT 3, ART BLOOD GAS (G3+)
Acid-Base Excess: 1 mmol/L (ref 0.0–2.0)
Bicarbonate: 24.8 mmol/L (ref 20.0–28.0)
O2 SAT: 97 %
PCO2 ART: 35.7 mmHg (ref 32.0–48.0)
PO2 ART: 86 mmHg (ref 83.0–108.0)
Patient temperature: 98.8
TCO2: 26 mmol/L (ref 0–100)
pH, Arterial: 7.45 (ref 7.350–7.450)

## 2016-04-05 NOTE — Progress Notes (Signed)
Stopped by to visit w/ sister in rm. She remains grateful for improving med news re: her brother and the emotional support and requests I keep pt in prayer. Her mom and small son were reportedly again in waiting rm, but did not see them. Chaplain available for f/u.   04/05/16 1000  Clinical Encounter Type  Visited With Patient and family together  Visit Type Follow-up;Psychological support;Spiritual support;Social support;Critical Care  Referral From Chaplain  Spiritual Encounters  Spiritual Needs Emotional  Stress Factors  Patient Stress Factors Health changes;Loss of control  Family Stress Factors Family relationships;Health changes;Loss of control   Ephraim Hamburgerynthia A Vineet Kinney, [address]haplain

## 2016-04-05 NOTE — Progress Notes (Signed)
Pt remains unchanged. On Vanc/Ceftriaxone for coag (-) staph bacteremia and Serratia pneumonia. Unchanged neurologic exam. Tentatively planning on stabilization of T8 fracture on Thursday afternoon if yesterday's blood cx are neg.
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
## 2016-04-06 ENCOUNTER — Inpatient Hospital Stay (HOSPITAL_COMMUNITY): Payer: Medicaid Other

## 2016-04-06 LAB — CBC
HCT: 28.9 % — ABNORMAL LOW (ref 39.0–52.0)
HEMOGLOBIN: 9.4 g/dL — AB (ref 13.0–17.0)
MCH: 28.1 pg (ref 26.0–34.0)
MCHC: 32.5 g/dL (ref 30.0–36.0)
MCV: 86.5 fL (ref 78.0–100.0)
PLATELETS: 440 10*3/uL — AB (ref 150–400)
RBC: 3.34 MIL/uL — AB (ref 4.22–5.81)
RDW: 14.1 % (ref 11.5–15.5)
WBC: 11.2 10*3/uL — ABNORMAL HIGH (ref 4.0–10.5)

## 2016-04-06 LAB — BASIC METABOLIC PANEL
ANION GAP: 8 (ref 5–15)
BUN: 19 mg/dL (ref 6–20)
CALCIUM: 8.7 mg/dL — AB (ref 8.9–10.3)
CO2: 24 mmol/L (ref 22–32)
CREATININE: 0.52 mg/dL — AB (ref 0.61–1.24)
Chloride: 102 mmol/L (ref 101–111)
Glucose, Bld: 107 mg/dL — ABNORMAL HIGH (ref 65–99)
Potassium: 4.3 mmol/L (ref 3.5–5.1)
SODIUM: 134 mmol/L — AB (ref 135–145)

## 2016-04-06 LAB — BLOOD CULTURE ID PANEL (REFLEXED)
ACINETOBACTER BAUMANNII: NOT DETECTED
CANDIDA GLABRATA: NOT DETECTED
CANDIDA KRUSEI: NOT DETECTED
Candida albicans: NOT DETECTED
Candida parapsilosis: NOT DETECTED
Candida tropicalis: NOT DETECTED
ENTEROBACTER CLOACAE COMPLEX: NOT DETECTED
ENTEROCOCCUS SPECIES: NOT DETECTED
ESCHERICHIA COLI: NOT DETECTED
Enterobacteriaceae species: NOT DETECTED
Haemophilus influenzae: NOT DETECTED
Klebsiella oxytoca: NOT DETECTED
Klebsiella pneumoniae: NOT DETECTED
LISTERIA MONOCYTOGENES: NOT DETECTED
Methicillin resistance: DETECTED — AB
NEISSERIA MENINGITIDIS: NOT DETECTED
PROTEUS SPECIES: NOT DETECTED
PSEUDOMONAS AERUGINOSA: NOT DETECTED
SERRATIA MARCESCENS: NOT DETECTED
STAPHYLOCOCCUS AUREUS BCID: NOT DETECTED
STAPHYLOCOCCUS SPECIES: DETECTED — AB
STREPTOCOCCUS AGALACTIAE: NOT DETECTED
STREPTOCOCCUS PNEUMONIAE: NOT DETECTED
STREPTOCOCCUS SPECIES: NOT DETECTED
Streptococcus pyogenes: NOT DETECTED

## 2016-04-06 MED ORDER — WHITE PETROLATUM GEL
Status: AC
  Administered 2016-04-06: 10:00:00
  Filled 2016-04-06: qty 1
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
## 2016-04-06 NOTE — Progress Notes (Signed)
Pt having low U.O. Bladder scanned pt = 865m.  Irrigated foley and met resistance.  Notified Dr. KKieth Brightly  Orders to replace foley with a new one.  Placed new foley with ACaryl Pina RN.  Pt immediately dumped 10027mof urine.  Pt not distended and seemingly comfortable at this time.  Will continue to monitor.

## 2016-04-06 NOTE — Progress Notes (Addendum)
PHARMACY - PHYSICIAN COMMUNICATION CRITICAL VALUE ALERT - BLOOD CULTURE IDENTIFICATION (BCID)  Results for orders placed or performed during the hospital encounter of 03/15/16  Blood Culture ID Panel (Reflexed) (Collected: 04/04/2016  9:30 AM)  Result Value Ref Range   Enterococcus species NOT DETECTED NOT DETECTED   Listeria monocytogenes NOT DETECTED NOT DETECTED   Staphylococcus species DETECTED (A) NOT DETECTED   Staphylococcus aureus NOT DETECTED NOT DETECTED   Methicillin resistance DETECTED (A) NOT DETECTED   Streptococcus species NOT DETECTED NOT DETECTED   Streptococcus agalactiae NOT DETECTED NOT DETECTED   Streptococcus pneumoniae NOT DETECTED NOT DETECTED   Streptococcus pyogenes NOT DETECTED NOT DETECTED   Acinetobacter baumannii NOT DETECTED NOT DETECTED   Enterobacteriaceae species NOT DETECTED NOT DETECTED   Enterobacter cloacae complex NOT DETECTED NOT DETECTED   Escherichia coli NOT DETECTED NOT DETECTED   Klebsiella oxytoca NOT DETECTED NOT DETECTED   Klebsiella pneumoniae NOT DETECTED NOT DETECTED   Proteus species NOT DETECTED NOT DETECTED   Serratia marcescens NOT DETECTED NOT DETECTED   Haemophilus influenzae NOT DETECTED NOT DETECTED   Neisseria meningitidis NOT DETECTED NOT DETECTED   Pseudomonas aeruginosa NOT DETECTED NOT DETECTED   Candida albicans NOT DETECTED NOT DETECTED   Candida glabrata NOT DETECTED NOT DETECTED   Candida krusei NOT DETECTED NOT DETECTED   Candida parapsilosis NOT DETECTED NOT DETECTED   Candida tropicalis NOT DETECTED NOT DETECTED    Name of physician (or Provider) Contacted: Dr. 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 Unknown if real or contaminant at this point. To complete course of Ceftriaxone for serratia pneumonia today per notes.   Sheron NightingaleJames A Josey Dettmann 04/06/2016  12:31 PM  Phone # [phone] until 3:30 pm

## 2016-04-06 NOTE — Progress Notes (Signed)
Stopped by to speak w/ pt's sister when she was stepping out of rm for med staff to attend to pt. She again spoke encouragingly of her brother's  progress, and was happy to see the [employer] brochure giving the prayers for the brief service to give communion for the sick in the hospital that I'd found in [place].(These are the prayers that lay [facility] [facility] communion ministers use who [facility] give pts/families communion 3x/wk on M/W/F from different Catholic parishes.)   Pt's sister said her mom would love to read the little booklet (which she can do in [place]) and would be in the rm after 2 pm after sister had to leave, so, if I'd stop by and pray w/ her mom after that (which she really enjoys), she could then return it to me. Will check back w/ pt's mom then.   04/06/16 1100  Clinical Encounter Type  Visited With Family  Visit Type Follow-up;Psychological support;Spiritual support;Social support;Critical Care  Referral From Chaplain  Spiritual Encounters  Spiritual Needs Prayer;Ritual;Emotional  Stress Factors  Patient Stress Factors Health changes;Loss of control  Family Stress Factors Family relationships;Health changes;Loss of control   Luis [name], [address]

## 2016-04-06 NOTE — Progress Notes (Signed)
   Subjective:    Patient ID: Luis [name], male    DOB: Feb 14, 1996, 21 y.o.   MRN: [id number]  HPI Remains in ICU.  Back surgery pending.    Review of Systems     Objective:   Physical Exam Left brow incision healing well, sutures removed.  Edema improved.  Opening left eye fairly well.  Good bony contour to frontal sinus area and superior orbital rim.       Assessment & Plan:  S/p ORIF Left frontal sinus fracture  Sutures removed.  Looks good.  He can follow-up with me after discharge.

## 2016-04-06 NOTE — Progress Notes (Signed)
Encountered pt's mother in hall as was coming to visit w/ her and pt in rm. She returned the [employer] liturgy booklet I'd loaned her thru her daughter this morning, and thanked me very much for the beautiful prayers, which she had much appreciated and shared with her son (who understood them all). She's so happy he continues to make progress. Chaplain available for f/u.   04/06/16 1500  Clinical Encounter Type  Visited With Family  Visit Type Follow-up;Psychological support;Spiritual support;Social support;Critical Care  Referral From Chaplain  Spiritual Encounters  Spiritual Needs Prayer;Emotional  Stress Factors  Patient Stress Factors Health changes;Loss of control  Family Stress Factors Family relationships;Health changes;Loss of control   Luis Hamburgerynthia A Hridaan [name], [address]

## 2016-04-07 ENCOUNTER — Encounter (HOSPITAL_COMMUNITY): Admission: EM | Disposition: A | Discharge status: Rehabilitation Facility | Payer: Self-pay | Source: Home / Self Care

## 2016-04-07 ENCOUNTER — Encounter (HOSPITAL_COMMUNITY): Payer: Self-pay | Admitting: Anesthesiology

## 2016-04-07 ENCOUNTER — Inpatient Hospital Stay (HOSPITAL_COMMUNITY): Payer: Medicaid Other

## 2016-04-07 LAB — VANCOMYCIN, RANDOM: VANCOMYCIN RM: 7

## 2016-04-07 LAB — CBC
HCT: 28.3 % — ABNORMAL LOW (ref 39.0–52.0)
HEMOGLOBIN: 9.1 g/dL — AB (ref 13.0–17.0)
MCH: 28.2 pg (ref 26.0–34.0)
MCHC: 32.2 g/dL (ref 30.0–36.0)
MCV: 87.6 fL (ref 78.0–100.0)
Platelets: 380 10*3/uL (ref 150–400)
RBC: 3.23 MIL/uL — AB (ref 4.22–5.81)
RDW: 14.4 % (ref 11.5–15.5)
WBC: 8.8 10*3/uL (ref 4.0–10.5)

## 2016-04-07 LAB — BASIC METABOLIC PANEL
Anion gap: 5 (ref 5–15)
BUN: 29 mg/dL — ABNORMAL HIGH (ref 6–20)
CHLORIDE: 107 mmol/L (ref 101–111)
CO2: 25 mmol/L (ref 22–32)
Calcium: 8.8 mg/dL — ABNORMAL LOW (ref 8.9–10.3)
Creatinine, Ser: 0.55 mg/dL — ABNORMAL LOW (ref 0.61–1.24)
GFR calc non Af Amer: >60 mL/min (ref >60)
Glucose, Bld: 102 mg/dL — ABNORMAL HIGH (ref 65–99)
POTASSIUM: 4.1 mmol/L (ref 3.5–5.1)
SODIUM: 137 mmol/L (ref 135–145)

## 2016-04-07 LAB — TYPE AND SCREEN
ABO/RH(D): O POS
Antibody Screen: NEGATIVE

## 2016-04-07 LAB — VANCOMYCIN, TROUGH: VANCOMYCIN TR: 30 ug/mL — AB (ref 15–20)

## 2016-04-07 SURGERY — POSTERIOR LUMBAR FUSION 4 LEVEL
Anesthesia: General

## 2016-04-07 MED ORDER — SODIUM CHLORIDE 0.9% FLUSH: 10.0000 mL | INTRAVENOUS | Status: DC | PRN

## 2016-04-07 MED ORDER — VANCOMYCIN HCL 10 G IV SOLR
1250.0000 mg | Freq: Three times a day (TID) | INTRAVENOUS | Status: DC
  Administered 2016-04-07 – 2016-04-14 (×20): 1250 mg via INTRAVENOUS
  Filled 2016-04-07 (×21): qty 1250

## 2016-04-07 MED ORDER — SODIUM CHLORIDE 0.9% FLUSH
10.0000 mL | Freq: Two times a day (BID) | INTRAVENOUS | Status: DC
  Administered 2016-04-07 – 2016-04-15 (×13): 10 mL
  Administered 2016-04-15: 30 mL
  Administered 2016-04-16 – 2016-04-18 (×4): 10 mL

## 2016-04-07 NOTE — Progress Notes (Signed)
CRITICAL VALUE ALERT  Critical value received:  Vanc Troph 30  Date of notification:  04/07/16  Time of notification:  0638  Critical value read back:Yes.    Nurse who received alert:  Hector BrunswickE. Maebry Obrien  MD notified (1st page):  Bonnielee HaffAndrew RPh  Time of first page:  [phone]  MD notified (2nd page):  Time of second page:  Responding MD: Bonnielee HaffAndrew RPh  Time MD responded:  [phone]  Pharmacy aware of troph level, adjusting as needed.

## 2016-04-07 NOTE — Progress Notes (Signed)
Pharmacy Antibiotic Note  Luis [name] is a [name] y.o. male admitted on 03/15/2016 with CoNS bacteremia and MSSA / Serratia PNA. Pharmacy has been consulted for vancomycin dosing.  VT today was low at 7 after being high earlier today on 1.5g IV Q8 dosing. Afebrile, WBC down to wnl. SCr stable, CrCl > 16500ml/min. UOP remains good.  Plan: Restart vancomycin at 1.25g IV Q8 Monitor clinical picture, renal function, VT at new Css F/U C&S, abx deescalation / LOT   Height: 5\' 6"  (167.6 cm) Weight: 166 lb 7.2 oz (75.5 kg) IBW/kg (Calculated) : 63.8  Temp (24hrs), Avg:99.2 F (37.3 C), Min:98.7 F (37.1 C), Max:99.9 F (37.7 C)   Recent Labs Lab 04/02/16 0454  04/03/16 0500 04/04/16 0500 04/04/16 1347 04/06/16 0348 04/07/16 0543 04/07/16 1749  WBC 7.9  --  11.8* 8.4  --  11.2* 8.8  --   CREATININE 0.54*  --  0.54* 0.49*  --  0.52* 0.55*  --   VANCOTROUGH  --   < >  --   --  14*  --  30*  --   VANCORANDOM  --   --   --   --   --   --   --  7  < > = values in this interval not displayed.  Estimated Creatinine Clearance: 132.9 mL/min (by C-G formula based on SCr of 0.55 mg/dL (L)).    Allergies  Allergen Reactions  . No Known Allergies     Antimicrobials this admission: Zosyn 12/23 >> 12/25 Vancomycin 12/23 >>12/25, 1/4 >> Unasyn 12/25 >> 12 /29, 1/1>>1/4  Ceftriaxone 1/4 >> 1/10  VT = 11 on 1g Q8H VT = 30 on 1.5g IV Q8 VR = 7  12/22 BCx: 1/2 CNS 12/22 BCID: staph species 12/22 UCx: ngf 12/22  Sputum: Abundant staph MSSA 12/21 MRSA nasal culture - detected 12/23 MRSA neg 12/26 Resp Cx: MSSA pan sensitive 12/31 pleura: NEG 1/1 TA: serratia + MSSA 1/1 BCx2: CoNS in 2 of 2 - sens to vanc 1/8 BCx2: ngtd 1/8 BCID: MR Staph species  Thank you for allowing pharmacy to be a part of this patient's care.  Armandina StammerBATCHELDER,[name] 04/07/2016 6:46 PM

## 2016-04-07 NOTE — Progress Notes (Addendum)
No issues overnight.   EXAM:  BP 104/71   Pulse 100   Temp 98.9 F (37.2 C) (Oral)   Resp 15   Ht 5\' 6"  (1.676 m)   Wt 75.5 kg (166 lb 7.2 oz)   SpO2 99%   BMI 26.87 kg/m   Awake, alert Breathing on trach collar Moves BUE good strength No significant movement BLE  MICRO: 1/2 blood cultures from 04/04/16 (+) for MRSE  IMPRESSION:  21 y.o. male with unstable T8 fracture, complete SCI. Will need to postpone surgery until blood cultures clear  PLAN: - Cont supportive care, Abx. - Cancel surgery today, will tentatively plan on rescheduling for early next week.  I reviewed the situation with the patient's father. Explained the rationale for postponing surgery. He understands, all questions were answered.

## 2016-04-07 NOTE — Progress Notes (Signed)
Stopped by to visit w/ pt alone in rm when saw that his eyes were open. Spoke directly to him, offering blessings and good wishes -- also spoke to him about his wonderful family, many of whom I've seen in the rm and waiting area so faithfully and have spoke/prayed w/ on his behalf.He seemed to understand. Chaplain available for f/u.    04/06/16 1600  Clinical Encounter Type  Visited With Patient  Visit Type Follow-up;Psychological support;Spiritual support;Social support;Critical Care  Referral From Chaplain  Spiritual Encounters  Spiritual Needs Emotional  Stress Factors  Patient Stress Factors Health changes;Loss of control   Luis [name], [facility]
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
## 2016-04-07 NOTE — Progress Notes (Signed)
Peripherally Inserted Central Catheter/Midline Placement  The IV Nurse has discussed with the patient and/or persons authorized to consent for the patient, the purpose of this procedure and the potential benefits and risks involved with this procedure.  The benefits include less needle sticks, lab draws from the catheter, and the patient may be discharged home with the catheter. Risks include, but not limited to, infection, bleeding, blood clot (thrombus formation), and puncture of an artery; nerve damage and irregular heartbeat and possibility to perform a PICC exchange if needed/ordered by physician.  Alternatives to this procedure were also discussed.  B[employer] Power PICC patient education guide, fact sheet on infection prevention and patient information card has been provided to patient /or left at bedside.    PICC/Midline Placement Documentation    Consent signed by Mother at bedside, dad on the phone while explaining the risk and benefits of PICC placement    Maximino GreenlandLumban, Brentin Shin Albarece 04/07/2016, 11:58 AM

## 2016-04-07 NOTE — Progress Notes (Signed)
Pharmacy Antibiotic Note  Luis [name] is a 21 y.o. male admitted on 03/15/2016 with CoNS bacteremia and MSSA and Serratia pneumonia.  Pharmacy has been consulted for vancomycin and ceftriaxone dosing. -Vancomycin trough is 30 (collected at ~ 6am on 1500mg  IV q8h). Last dose given ~ midnight -Prior levels have been 11 and 14 on q8hr regimens -SCr= 0.55 and stable, UOP ~ 2.5 ml/kg/hr  Plan: -Hold vancomycin for now -Will recheck a vancomycin level in 12 hours  Height: 5\' 6"  (167.6 cm) Weight: 166 lb 7.2 oz (75.5 kg) IBW/kg (Calculated) : 63.8  Temp (24hrs), Avg:99.1 F (37.3 C), Min:98.7 F (37.1 C), Max:99.6 F (37.6 C)   Recent Labs Lab 04/02/16 0454  04/03/16 0500 04/04/16 0500 04/04/16 1347 04/06/16 0348 04/07/16 0543  WBC 7.9  --  11.8* 8.4  --  11.2* 8.8  CREATININE 0.54*  --  0.54* 0.49*  --  0.52* 0.55*  VANCOTROUGH  --   < >  --   --  14*  --  30*  < > = values in this interval not displayed.  Estimated Creatinine Clearance: 132.9 mL/min (by C-G formula based on SCr of 0.55 mg/dL (L)).    Allergies  Allergen Reactions  . No Known Allergies     Antimicrobials this admission: Zosyn 12/23 >>12/25 Vancomycin 12/23 >>12/25, 1/4>> Unasyn 12/25>> 12 /29, 1/1>>1/4  Ceftriaxone 1/4>>  Dose adjustments this admission: 1/6: VT 11 on 1g IV q8h >> increased to 1250 mg IV q8h 1/8: VT 14 on 1250 IV q8h >> increased to 1500 mg IV q8h 1/11 VT 30 on 1500mg  IV q8h  Microbiology results: 12/22 BCx: 1/2 CNS 12/22 BCID: staph species 12/22 UCx: ngf 12/22  Sputum: Abundant staph MSSA 12/21 MRSA nasal culture - detected 12/23 MRSA neg 12/26 Resp Cx: MSSA pan sensitive 12/31 pleura: NEG 1/1 TA: serratia + MSSA 1/1 BCx2: CoNS in 2 of 2 - sens to vanc 1/8 blood x2- GPC/clusters 1/8 BCID: MRSE  Thank you for allowing pharmacy to be a part of this patient's care. Harland GermanAndrew Ryo Klang, Pharm D 04/07/2016 6:45 AM
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
## 2016-04-08 LAB — CULTURE, BLOOD (ROUTINE X 2)

## 2016-04-08 LAB — CBC
HEMATOCRIT: 28.7 % — AB (ref 39.0–52.0)
Hemoglobin: 9.3 g/dL — ABNORMAL LOW (ref 13.0–17.0)
MCH: 28.4 pg (ref 26.0–34.0)
MCHC: 32.4 g/dL (ref 30.0–36.0)
MCV: 87.5 fL (ref 78.0–100.0)
PLATELETS: 355 10*3/uL (ref 150–400)
RBC: 3.28 MIL/uL — ABNORMAL LOW (ref 4.22–5.81)
RDW: 14.6 % (ref 11.5–15.5)
WBC: 9.2 10*3/uL (ref 4.0–10.5)

## 2016-04-08 MED ORDER — PRO-STAT SUGAR FREE PO LIQD
30.0000 mL | Freq: Every day | ORAL | Status: DC
  Administered 2016-04-09 – 2016-04-17 (×8): 30 mL
  Filled 2016-04-08 (×8): qty 30

## 2016-04-08 MED ORDER — PIVOT 1.5 CAL PO LIQD
1000.0000 mL | ORAL | Status: DC
  Administered 2016-04-08 – 2016-04-17 (×12): 1000 mL
  Filled 2016-04-08 (×11): qty 1000

## 2016-04-08 MED ORDER — HYDROCODONE-ACETAMINOPHEN 7.5-325 MG/15ML PO SOLN
15.0000 mL | ORAL | Status: DC | PRN
  Administered 2016-04-09 – 2016-04-13 (×7): 15 mL
  Filled 2016-04-08 (×7): qty 15

## 2016-04-08 MED ORDER — FENTANYL BOLUS VIA INFUSION: 25.0000 ug | INTRAVENOUS | Status: DC | PRN

## 2016-04-08 MED ORDER — FENTANYL CITRATE (PF) 100 MCG/2ML IJ SOLN
25.0000 ug | INTRAMUSCULAR | Status: DC | PRN
  Administered 2016-04-08 – 2016-04-13 (×8): 50 ug via INTRAVENOUS
  Filled 2016-04-08 (×8): qty 2

## 2016-04-08 NOTE — Progress Notes (Signed)
Pt fentanyl gtt remains off. Discarded approximately 260 cc of fentanyl down the sink with Juleen Starraryn Hutton RN4 as witness.
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
## 2016-04-09 LAB — BASIC METABOLIC PANEL
ANION GAP: 7 (ref 5–15)
BUN: 19 mg/dL (ref 6–20)
CALCIUM: 8.8 mg/dL — AB (ref 8.9–10.3)
CO2: 25 mmol/L (ref 22–32)
CREATININE: 0.41 mg/dL — AB (ref 0.61–1.24)
Chloride: 105 mmol/L (ref 101–111)
Glucose, Bld: 128 mg/dL — ABNORMAL HIGH (ref 65–99)
Potassium: 3.9 mmol/L (ref 3.5–5.1)
SODIUM: 137 mmol/L (ref 135–145)

## 2016-04-09 LAB — CBC
HCT: 30.7 % — ABNORMAL LOW (ref 39.0–52.0)
Hemoglobin: 9.9 g/dL — ABNORMAL LOW (ref 13.0–17.0)
MCH: 28 pg (ref 26.0–34.0)
MCHC: 32.2 g/dL (ref 30.0–36.0)
MCV: 86.7 fL (ref 78.0–100.0)
PLATELETS: 423 10*3/uL — AB (ref 150–400)
RBC: 3.54 MIL/uL — ABNORMAL LOW (ref 4.22–5.81)
RDW: 14 % (ref 11.5–15.5)
WBC: 10.3 10*3/uL (ref 4.0–10.5)

## 2016-04-09 LAB — CULTURE, BLOOD (ROUTINE X 2): CULTURE: NO GROWTH

## 2016-04-09 NOTE — Progress Notes (Signed)
Overall stable. Continues on antibiotics for sepsis. Surgery once this issues resolved.
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
## 2016-04-10 LAB — VANCOMYCIN, TROUGH: VANCOMYCIN TR: 16 ug/mL (ref 15–20)

## 2016-04-10 MED ORDER — ORAL CARE MOUTH RINSE
15.0000 mL | Freq: Two times a day (BID) | OROMUCOSAL | Status: DC
Start: 2016-04-11 — End: 2016-04-19
  Administered 2016-04-11 – 2016-04-19 (×13): 15 mL via OROMUCOSAL

## 2016-04-10 MED ORDER — CHLORHEXIDINE GLUCONATE 0.12 % MT SOLN
15.0000 mL | Freq: Two times a day (BID) | OROMUCOSAL | Status: DC
  Administered 2016-04-10 – 2016-04-19 (×16): 15 mL via OROMUCOSAL
  Filled 2016-04-10 (×16): qty 15

## 2016-04-10 NOTE — Progress Notes (Signed)
No new issues or problems. Continue antibiotics. Spinal stabilization sometime next week.
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
## 2016-04-11 LAB — COMPREHENSIVE METABOLIC PANEL
ALBUMIN: 2.3 g/dL — AB (ref 3.5–5.0)
ALK PHOS: 180 U/L — AB (ref 38–126)
ALT: 51 U/L (ref 17–63)
ANION GAP: 9 (ref 5–15)
AST: 31 U/L (ref 15–41)
BILIRUBIN TOTAL: 0.7 mg/dL (ref 0.3–1.2)
BUN: 23 mg/dL — ABNORMAL HIGH (ref 6–20)
CALCIUM: 9.1 mg/dL (ref 8.9–10.3)
CO2: 26 mmol/L (ref 22–32)
Chloride: 103 mmol/L (ref 101–111)
Creatinine, Ser: 0.44 mg/dL — ABNORMAL LOW (ref 0.61–1.24)
GFR calc Af Amer: >60 mL/min (ref >60)
GLUCOSE: 104 mg/dL — AB (ref 65–99)
Potassium: 3.9 mmol/L (ref 3.5–5.1)
Sodium: 138 mmol/L (ref 135–145)
TOTAL PROTEIN: 7.1 g/dL (ref 6.5–8.1)

## 2016-04-11 LAB — CBC
HCT: 32.9 % — ABNORMAL LOW (ref 39.0–52.0)
Hemoglobin: 10.5 g/dL — ABNORMAL LOW (ref 13.0–17.0)
MCH: 28.4 pg (ref 26.0–34.0)
MCHC: 31.9 g/dL (ref 30.0–36.0)
MCV: 88.9 fL (ref 78.0–100.0)
PLATELETS: 392 10*3/uL (ref 150–400)
RBC: 3.7 MIL/uL — ABNORMAL LOW (ref 4.22–5.81)
RDW: 14.1 % (ref 11.5–15.5)
WBC: 8.9 10*3/uL (ref 4.0–10.5)

## 2016-04-11 LAB — PREALBUMIN: Prealbumin: 23.8 mg/dL (ref 18–38)
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
## 2016-04-12 ENCOUNTER — Inpatient Hospital Stay (HOSPITAL_COMMUNITY): Payer: Medicaid Other | Admitting: Certified Registered Nurse Anesthetist

## 2016-04-12 ENCOUNTER — Inpatient Hospital Stay (HOSPITAL_COMMUNITY): Payer: Medicaid Other

## 2016-04-12 ENCOUNTER — Encounter (HOSPITAL_COMMUNITY): Admission: EM | Disposition: A | Discharge status: Rehabilitation Facility | Payer: Self-pay | Source: Home / Self Care

## 2016-04-12 HISTORY — PX: POSTERIOR LUMBAR FUSION 4 LEVEL: SHX6037

## 2016-04-12 LAB — BASIC METABOLIC PANEL
Anion gap: 6 (ref 5–15)
BUN: 20 mg/dL (ref 6–20)
CALCIUM: 9.2 mg/dL (ref 8.9–10.3)
CO2: 27 mmol/L (ref 22–32)
CREATININE: 0.41 mg/dL — AB (ref 0.61–1.24)
Chloride: 102 mmol/L (ref 101–111)
GFR calc Af Amer: >60 mL/min (ref >60)
GFR calc non Af Amer: >60 mL/min (ref >60)
Glucose, Bld: 103 mg/dL — ABNORMAL HIGH (ref 65–99)
Potassium: 3.9 mmol/L (ref 3.5–5.1)
SODIUM: 135 mmol/L (ref 135–145)

## 2016-04-12 LAB — CBC
HCT: 29.4 % — ABNORMAL LOW (ref 39.0–52.0)
HEMOGLOBIN: 9.5 g/dL — AB (ref 13.0–17.0)
MCH: 28.1 pg (ref 26.0–34.0)
MCHC: 32.3 g/dL (ref 30.0–36.0)
MCV: 87 fL (ref 78.0–100.0)
PLATELETS: 364 10*3/uL (ref 150–400)
RBC: 3.38 MIL/uL — ABNORMAL LOW (ref 4.22–5.81)
RDW: 14.5 % (ref 11.5–15.5)
WBC: 8.2 10*3/uL (ref 4.0–10.5)

## 2016-04-12 LAB — TYPE AND SCREEN
ABO/RH(D): O POS
Antibody Screen: NEGATIVE

## 2016-04-12 SURGERY — POSTERIOR LUMBAR FUSION 4 LEVEL
Anesthesia: General

## 2016-04-12 MED ORDER — THROMBIN 20000 UNITS EX SOLR
CUTANEOUS | Status: DC | PRN
  Administered 2016-04-12: 14:00:00 via TOPICAL

## 2016-04-12 MED ORDER — SODIUM CHLORIDE 0.9 % IJ SOLN
INTRAMUSCULAR | Status: AC
  Filled 2016-04-12: qty 20

## 2016-04-12 MED ORDER — LIDOCAINE-EPINEPHRINE 1 %-1:100000 IJ SOLN
INTRAMUSCULAR | Status: DC | PRN
  Administered 2016-04-12: 5 mL

## 2016-04-12 MED ORDER — PHENYLEPHRINE HCL 10 MG/ML IJ SOLN
INTRAVENOUS | Status: DC | PRN
  Administered 2016-04-12: 35 ug/min via INTRAVENOUS

## 2016-04-12 MED ORDER — BACITRACIN ZINC 500 UNIT/GM EX OINT
TOPICAL_OINTMENT | CUTANEOUS | Status: AC
  Filled 2016-04-12: qty 28.35

## 2016-04-12 MED ORDER — VECURONIUM BROMIDE 10 MG IV SOLR
INTRAVENOUS | Status: DC | PRN
  Administered 2016-04-12: 4 mg via INTRAVENOUS
  Administered 2016-04-12 (×3): 2 mg via INTRAVENOUS

## 2016-04-12 MED ORDER — EPHEDRINE 5 MG/ML INJ
INTRAVENOUS | Status: AC
  Filled 2016-04-12: qty 20

## 2016-04-12 MED ORDER — PHENYLEPHRINE 40 MCG/ML (10ML) SYRINGE FOR IV PUSH (FOR BLOOD PRESSURE SUPPORT)
PREFILLED_SYRINGE | INTRAVENOUS | Status: AC
  Filled 2016-04-12: qty 30

## 2016-04-12 MED ORDER — LACTATED RINGERS IV SOLN
INTRAVENOUS | Status: DC | PRN
  Administered 2016-04-12: 13:00:00 via INTRAVENOUS

## 2016-04-12 MED ORDER — PHENYLEPHRINE 40 MCG/ML (10ML) SYRINGE FOR IV PUSH (FOR BLOOD PRESSURE SUPPORT)
PREFILLED_SYRINGE | INTRAVENOUS | Status: DC | PRN
  Administered 2016-04-12 (×3): 40 ug via INTRAVENOUS
  Administered 2016-04-12: 80 ug via INTRAVENOUS
  Administered 2016-04-12: 120 ug via INTRAVENOUS

## 2016-04-12 MED ORDER — VECURONIUM BROMIDE 10 MG IV SOLR
INTRAVENOUS | Status: AC
  Filled 2016-04-12: qty 20

## 2016-04-12 MED ORDER — FENTANYL CITRATE (PF) 100 MCG/2ML IJ SOLN
INTRAMUSCULAR | Status: AC
  Filled 2016-04-12: qty 4

## 2016-04-12 MED ORDER — MIDAZOLAM HCL 5 MG/5ML IJ SOLN
INTRAMUSCULAR | Status: DC | PRN
  Administered 2016-04-12: 2 mg via INTRAVENOUS

## 2016-04-12 MED ORDER — MIDAZOLAM HCL 2 MG/2ML IJ SOLN
INTRAMUSCULAR | Status: AC
  Filled 2016-04-12: qty 2

## 2016-04-12 MED ORDER — FENTANYL CITRATE (PF) 100 MCG/2ML IJ SOLN
INTRAMUSCULAR | Status: AC
  Filled 2016-04-12: qty 2

## 2016-04-12 MED ORDER — BUPIVACAINE HCL (PF) 0.5 % IJ SOLN
INTRAMUSCULAR | Status: DC | PRN
  Administered 2016-04-12: 5 mL

## 2016-04-12 MED ORDER — HYDROMORPHONE HCL 1 MG/ML IJ SOLN: 0.2500 mg | INTRAMUSCULAR | Status: DC | PRN

## 2016-04-12 MED ORDER — SUGAMMADEX SODIUM 200 MG/2ML IV SOLN
INTRAVENOUS | Status: AC
  Filled 2016-04-12: qty 2

## 2016-04-12 MED ORDER — ALBUMIN HUMAN 5 % IV SOLN
INTRAVENOUS | Status: DC | PRN
  Administered 2016-04-12 (×3): via INTRAVENOUS

## 2016-04-12 MED ORDER — ONDANSETRON HCL 4 MG/2ML IJ SOLN
INTRAMUSCULAR | Status: AC
  Filled 2016-04-12: qty 4

## 2016-04-12 MED ORDER — ONDANSETRON HCL 4 MG/2ML IJ SOLN
INTRAMUSCULAR | Status: DC | PRN
  Administered 2016-04-12: 4 mg via INTRAVENOUS

## 2016-04-12 MED ORDER — CLONAZEPAM 0.5 MG PO TABS
1.5000 mg | ORAL_TABLET | Freq: Three times a day (TID) | ORAL | Status: DC
  Administered 2016-04-12 – 2016-04-13 (×2): 1.5 mg
  Filled 2016-04-12 (×2): qty 1

## 2016-04-12 MED ORDER — THROMBIN 5000 UNITS EX SOLR
CUTANEOUS | Status: AC
  Filled 2016-04-12: qty 5000

## 2016-04-12 MED ORDER — QUETIAPINE FUMARATE 200 MG PO TABS
200.0000 mg | ORAL_TABLET | Freq: Two times a day (BID) | ORAL | Status: DC
  Administered 2016-04-12 – 2016-04-13 (×3): 200 mg
  Filled 2016-04-12 (×2): qty 1

## 2016-04-12 MED ORDER — THROMBIN 20000 UNITS EX SOLR
CUTANEOUS | Status: AC
  Filled 2016-04-12: qty 20000

## 2016-04-12 MED ORDER — METOPROLOL TARTRATE 25 MG/10 ML ORAL SUSPENSION
25.0000 mg | Freq: Two times a day (BID) | ORAL | Status: DC
  Administered 2016-04-12 – 2016-04-19 (×14): 25 mg
  Filled 2016-04-12 (×14): qty 10

## 2016-04-12 MED ORDER — ROCURONIUM BROMIDE 10 MG/ML (PF) SYRINGE
PREFILLED_SYRINGE | INTRAVENOUS | Status: DC | PRN
  Administered 2016-04-12 (×2): 50 mg via INTRAVENOUS

## 2016-04-12 MED ORDER — LACTATED RINGERS IV SOLN
INTRAVENOUS | Status: DC
  Administered 2016-04-12: 12:00:00 via INTRAVENOUS

## 2016-04-12 MED ORDER — BACITRACIN ZINC 500 UNIT/GM EX OINT
TOPICAL_OINTMENT | CUTANEOUS | Status: DC | PRN
  Administered 2016-04-12 (×2): 1 via TOPICAL

## 2016-04-12 MED ORDER — 0.9 % SODIUM CHLORIDE (POUR BTL) OPTIME
TOPICAL | Status: DC | PRN
  Administered 2016-04-12: 1000 mL

## 2016-04-12 MED ORDER — SUGAMMADEX SODIUM 200 MG/2ML IV SOLN
INTRAVENOUS | Status: DC | PRN
  Administered 2016-04-12: 200 mg via INTRAVENOUS

## 2016-04-12 MED ORDER — DEXTROSE 5 % IV SOLN
0.0000 ug/min | INTRAVENOUS | Status: DC
  Filled 2016-04-12: qty 4

## 2016-04-12 MED ORDER — LIDOCAINE-EPINEPHRINE (PF) 2 %-1:200000 IJ SOLN
INTRAMUSCULAR | Status: AC
  Filled 2016-04-12: qty 20

## 2016-04-12 MED ORDER — EPHEDRINE SULFATE-NACL 50-0.9 MG/10ML-% IV SOSY
PREFILLED_SYRINGE | INTRAVENOUS | Status: DC | PRN
  Administered 2016-04-12 (×2): 5 mg via INTRAVENOUS

## 2016-04-12 MED ORDER — PROPOFOL 10 MG/ML IV BOLUS
INTRAVENOUS | Status: DC | PRN
  Administered 2016-04-12: 90 mg via INTRAVENOUS

## 2016-04-12 MED ORDER — BACITRACIN 50000 UNITS IM SOLR
INTRAMUSCULAR | Status: DC | PRN
  Administered 2016-04-12: 14:00:00

## 2016-04-12 MED ORDER — FENTANYL CITRATE (PF) 100 MCG/2ML IJ SOLN
INTRAMUSCULAR | Status: DC | PRN
  Administered 2016-04-12 (×7): 50 ug via INTRAVENOUS
  Administered 2016-04-12: 100 ug via INTRAVENOUS
  Administered 2016-04-12: 50 ug via INTRAVENOUS

## 2016-04-12 MED ORDER — ROCURONIUM BROMIDE 50 MG/5ML IV SOSY
PREFILLED_SYRINGE | INTRAVENOUS | Status: AC
  Filled 2016-04-12: qty 10

## 2016-04-12 SURGICAL SUPPLY — 80 items
BAG DECANTER FOR FLEXI CONT (MISCELLANEOUS) ×3 IMPLANT
BENZOIN TINCTURE PRP APPL 2/3 (GAUZE/BANDAGES/DRESSINGS) IMPLANT
BINDER ABDOMINAL 12 ML 46-62 (SOFTGOODS) ×3 IMPLANT
BLADE CLIPPER SURG (BLADE) IMPLANT
BLADE SURG 11 STRL SS (BLADE) ×3 IMPLANT
BONE VIVIGEN FORMABLE 5.4CC (Bone Implant) ×3 IMPLANT
BUR MATCHSTICK NEURO 3.0 LAGG (BURR) ×3 IMPLANT
BUR PRECISION FLUTE 5.0 (BURR) IMPLANT
CANISTER SUCT 3000ML PPV (MISCELLANEOUS) ×3 IMPLANT
CARTRIDGE OIL MAESTRO DRILL (MISCELLANEOUS) ×1 IMPLANT
CATH FOLEY 2WAY SLVR  5CC 14FR (CATHETERS)
CATH FOLEY 2WAY SLVR 5CC 14FR (CATHETERS) IMPLANT
CLOSURE WOUND 1/2 X4 (GAUZE/BANDAGES/DRESSINGS)
CONT SPEC 4OZ CLIKSEAL STRL BL (MISCELLANEOUS) ×3 IMPLANT
COVER BACK TABLE 60X90IN (DRAPES) ×3 IMPLANT
DECANTER SPIKE VIAL GLASS SM (MISCELLANEOUS) IMPLANT
DERMABOND ADVANCED (GAUZE/BANDAGES/DRESSINGS) ×2
DERMABOND ADVANCED .7 DNX12 (GAUZE/BANDAGES/DRESSINGS) ×1 IMPLANT
DIFFUSER DRILL AIR PNEUMATIC (MISCELLANEOUS) ×3 IMPLANT
DRAPE C-ARM 42X72 X-RAY (DRAPES) ×3 IMPLANT
DRAPE C-ARMOR (DRAPES) ×3 IMPLANT
DRAPE LAPAROTOMY 100X72X124 (DRAPES) ×3 IMPLANT
DRAPE POUCH INSTRU U-SHP 10X18 (DRAPES) ×3 IMPLANT
DRAPE SURG 17X23 STRL (DRAPES) ×3 IMPLANT
DRESSING ALLEVYN LIFE SACRUM (GAUZE/BANDAGES/DRESSINGS) ×3 IMPLANT
DRSG OPSITE POSTOP 4X10 (GAUZE/BANDAGES/DRESSINGS) ×3 IMPLANT
DURAPREP 26ML APPLICATOR (WOUND CARE) ×6 IMPLANT
ELECT REM PT RETURN 9FT ADLT (ELECTROSURGICAL) ×3
ELECTRODE REM PT RTRN 9FT ADLT (ELECTROSURGICAL) ×1 IMPLANT
GAUZE SPONGE 4X4 12PLY STRL (GAUZE/BANDAGES/DRESSINGS) IMPLANT
GAUZE SPONGE 4X4 16PLY XRAY LF (GAUZE/BANDAGES/DRESSINGS) ×3 IMPLANT
GLOVE BIOGEL PI IND STRL 7.5 (GLOVE) ×1 IMPLANT
GLOVE BIOGEL PI IND STRL 8.5 (GLOVE) ×1 IMPLANT
GLOVE BIOGEL PI INDICATOR 7.5 (GLOVE) ×2
GLOVE BIOGEL PI INDICATOR 8.5 (GLOVE) ×2
GLOVE ECLIPSE 7.0 STRL STRAW (GLOVE) ×6 IMPLANT
GLOVE ECLIPSE 8.5 STRL (GLOVE) ×3 IMPLANT
GLOVE EXAM NITRILE LRG STRL (GLOVE) IMPLANT
GLOVE EXAM NITRILE XL STR (GLOVE) IMPLANT
GLOVE EXAM NITRILE XS STR PU (GLOVE) IMPLANT
GOWN STRL REUS W/ TWL LRG LVL3 (GOWN DISPOSABLE) ×2 IMPLANT
GOWN STRL REUS W/ TWL XL LVL3 (GOWN DISPOSABLE) IMPLANT
GOWN STRL REUS W/TWL 2XL LVL3 (GOWN DISPOSABLE) IMPLANT
GOWN STRL REUS W/TWL LRG LVL3 (GOWN DISPOSABLE) ×4
GOWN STRL REUS W/TWL XL LVL3 (GOWN DISPOSABLE)
HEMOSTAT POWDER KIT SURGIFOAM (HEMOSTASIS) IMPLANT
KIT BASIN OR (CUSTOM PROCEDURE TRAY) ×3 IMPLANT
KIT INFUSE XX SMALL 0.7CC (Orthopedic Implant) ×3 IMPLANT
KIT POSITION SURG JACKSON T1 (MISCELLANEOUS) ×3 IMPLANT
KIT ROOM TURNOVER OR (KITS) ×3 IMPLANT
NEEDLE HYPO 18GX1.5 BLUNT FILL (NEEDLE) IMPLANT
NEEDLE HYPO 25X1 1.5 SAFETY (NEEDLE) ×3 IMPLANT
NEEDLE SPNL 18GX3.5 QUINCKE PK (NEEDLE) ×6 IMPLANT
NS IRRIG 1000ML POUR BTL (IV SOLUTION) ×6 IMPLANT
OIL CARTRIDGE MAESTRO DRILL (MISCELLANEOUS) ×3
PACK LAMINECTOMY NEURO (CUSTOM PROCEDURE TRAY) ×3 IMPLANT
PAD ARMBOARD 7.5X6 YLW CONV (MISCELLANEOUS) ×9 IMPLANT
PLUG CATH AND CAP STER (CATHETERS) ×3 IMPLANT
ROD VIPER KYPHOSED 150MM (Rod) ×4 IMPLANT
SCREW SET SINGLE INNER MIS (Screw) ×24 IMPLANT
SCREW VIPER XTAB 4.5X40 TI (Screw) ×6 IMPLANT
SCREW VIPER XTAB 4.5X45 TI (Screw) ×4 IMPLANT
SCREW VIPER XTAB 5X35 TI (Screw) ×4 IMPLANT
SCREW VIPER XTAB 5X45 TI (Screw) ×4 IMPLANT
SPONGE LAP 4X18 X RAY DECT (DISPOSABLE) IMPLANT
SPONGE SURGIFOAM ABS GEL 100 (HEMOSTASIS) ×3 IMPLANT
STAPLER VISISTAT 35W (STAPLE) ×3 IMPLANT
STRIP CLOSURE SKIN 1/2X4 (GAUZE/BANDAGES/DRESSINGS) IMPLANT
STYLET VIPER PRIME (MISCELLANEOUS) ×3 IMPLANT
SUT VIC AB 0 CT1 18XCR BRD8 (SUTURE) ×5 IMPLANT
SUT VIC AB 0 CT1 8-18 (SUTURE) ×10
SUT VICRYL 3-0 RB1 18 ABS (SUTURE) ×6 IMPLANT
SYR 3ML LL SCALE MARK (SYRINGE) IMPLANT
SYR CONTROL 10ML LL (SYRINGE) ×3 IMPLANT
TOWEL OR 17X24 6PK STRL BLUE (TOWEL DISPOSABLE) ×3 IMPLANT
TOWEL OR 17X26 10 PK STRL BLUE (TOWEL DISPOSABLE) ×3 IMPLANT
TRAP SPECIMEN MUCOUS 40CC (MISCELLANEOUS) IMPLANT
TRAY FOLEY W/METER SILVER 16FR (SET/KITS/TRAYS/PACK) IMPLANT
VIPER PRIME STYLET (MISCELLANEOUS) ×9
WATER STERILE IRR 1000ML POUR (IV SOLUTION) ×6 IMPLANT

## 2016-04-12 NOTE — Anesthesia Postprocedure Evaluation (Signed)
Anesthesia Post Note  Patient: Luis [name]  Procedure(s) Performed: Procedure(s) (LRB): THORACIC SEVEN - THORACIC NINE FUSION, THORACIC SIX - THORACIC TEN STABILIZATION (N/A)  Patient location during evaluation: PACU Anesthesia Type: General Level of consciousness: awake and alert Pain management: pain level controlled Vital Signs Assessment: post-procedure vital signs reviewed and stable Respiratory status: spontaneous breathing, nonlabored ventilation, respiratory function stable and patient connected to tracheostomy mask oxygen Cardiovascular status: blood pressure returned to baseline and stable Postop Assessment: no signs of nausea or vomiting Anesthetic complications: no       Last Vitals:  Vitals:   04/12/16 1932 04/12/16 2000  BP: 121/77 116/75  Pulse: (!) 109 (!) 113  Resp: 13 (!) 25  Temp:  37.1 C    Last Pain:  Vitals:   04/12/16 2000  TempSrc: Oral  PainSc:                  Greogry Goodwyn,W. EDMOND
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
## 2016-04-12 NOTE — Transfer of Care (Signed)
Immediate Anesthesia Transfer of Care Note  Patient: Luis [name]  Procedure(s) Performed: Procedure(s) with comments: THORACIC SEVEN - THORACIC NINE FUSION, THORACIC SIX - THORACIC TEN STABILIZATION (N/A) - THORACIC 7 - THORACIC 9 FUSION (NO INTERBODIES), THORACIC 6 - THORACIC 10 STABILIZATION  Patient Location: PACU  Anesthesia Type:General  Level of Consciousness: awake, alert , oriented and patient cooperative  Airway & Oxygen Therapy: Patient Spontanous Breathing and Patient connected to tracheostomy mask oxygen  Post-op Assessment: Report given to RN and Post -op Vital signs reviewed and stable  Post vital signs: Reviewed and stable  Last Vitals:  Vitals:   04/12/16 1115 04/12/16 1120  BP: (!) 95/54 (!) 95/54  Pulse: (!) 104 (!) 104  Resp: (!) 35 (!) 21  Temp:      Last Pain:  Vitals:   04/12/16 0800  TempSrc: Oral  PainSc: 0-No pain         Complications: No apparent anesthesia complications

## 2016-04-12 NOTE — Anesthesia Preprocedure Evaluation (Addendum)
Anesthesia Evaluation  Patient identified by MRN, date of birth, ID band Patient awake    Reviewed: Allergy & Precautions, H&P , NPO status , Patient's Chart, lab work & pertinent test results  Airway Mallampati: Trach  TM Distance: >3 FB Neck ROM: Limited    Dental no notable dental hx. (+) Teeth Intact, Dental Advisory Given   Pulmonary neg pulmonary ROS,    Pulmonary exam normal breath sounds clear to auscultation       Cardiovascular negative cardio ROS   Rhythm:Regular Rate:Normal     Neuro/Psych T8 spinal cord injury Moves BUE No movement BLE negative psych ROS   GI/Hepatic negative GI ROS, Neg liver ROS,   Endo/Other  negative endocrine ROS  Renal/GU negative Renal ROS  negative genitourinary   Musculoskeletal   Abdominal   Peds  Hematology negative hematology ROS (+)   Anesthesia Other Findings   Reproductive/Obstetrics negative OB ROS                            Anesthesia Physical Anesthesia Plan  ASA: III  Anesthesia Plan: General   Post-op Pain Management:    Induction: Intravenous  Airway Management Planned: Tracheostomy  Additional Equipment: Arterial line  Intra-op Plan:   Post-operative Plan: Possible Post-op intubation/ventilation  Informed Consent: I have reviewed the patients History and Physical, chart, labs and discussed the procedure including the risks, benefits and alternatives for the proposed anesthesia with the patient or authorized representative who has indicated his/her understanding and acceptance.   Dental advisory given  Plan Discussed with: CRNA  Anesthesia Plan Comments:         Anesthesia Quick Evaluation
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
## 2016-04-12 NOTE — Progress Notes (Signed)
Encountered pt's mother in hall, and offered spiritual/emotional support and prayer for pt's successful surgery. Also gave her small [employer] Medal as a Probation officerconcrete symbol of protection for pt to help ease her concerns. Chaplain available for f/u.    04/12/16 1300  Clinical Encounter Type  Visited With Family  Visit Type Follow-up;Psychological support;Spiritual support;Social support;Critical Care  Spiritual Encounters  Spiritual Needs Prayer;Emotional  Stress Factors  Patient Stress Factors Other (Comment) (surgery today)  Family Stress Factors Family relationships;Health changes;Loss of control;Other (Comment) (worried about pt's surgery today)   Ephraim Hamburgerynthia A Elsia Lasota, Chaplain

## 2016-04-12 NOTE — Progress Notes (Signed)
SLP Cancellation Note  Patient Details Name: Luis [name] MRN: [id number] DOB: September 14, 1995   Cancelled treatment:       Reason Eval/Treat Not Completed: Medical issues which prohibited therapy. Orders received for swallow evaluation, but per chart pt is pending surgery today. Will f/u for initiation of PO trials as able.   Maxcine Hamaiewonsky, Rodric Punch 04/12/2016, 11:26 AM  Maxcine HamLaura Paiewonsky, M.A. CCC-SLP [phone]

## 2016-04-12 NOTE — Anesthesia Procedure Notes (Signed)
Date/Time: 04/12/2016 12:26 PM Performed by: Annabelle HarmanSMITH, Leilanny Fluitt A Pre-anesthesia Checklist: Patient identified, Emergency Drugs available, Suction available and Patient being monitored Patient Re-evaluated:Patient Re-evaluated prior to inductionOxygen Delivery Method: Circle system utilized Preoxygenation: Pre-oxygenation with 100% oxygen Intubation Type: Combination inhalational/ intravenous induction and Tracheostomy Placement Confirmation: breath sounds checked- equal and bilateral and positive ETCO2 Comments: Induction with existing trach and IV combination.  Cuff inflated on trach and positive pressure ventilation started.  EBBS.

## 2016-04-13 ENCOUNTER — Inpatient Hospital Stay (HOSPITAL_COMMUNITY): Payer: Medicaid Other

## 2016-04-13 DIAGNOSIS — J9600 Acute respiratory failure, unspecified whether with hypoxia or hypercapnia: Secondary | ICD-10-CM

## 2016-04-13 DIAGNOSIS — R030 Elevated blood-pressure reading, without diagnosis of hypertension: Secondary | ICD-10-CM

## 2016-04-13 DIAGNOSIS — R7881 Bacteremia: Secondary | ICD-10-CM

## 2016-04-13 DIAGNOSIS — S24109A Unspecified injury at unspecified level of thoracic spinal cord, initial encounter: Secondary | ICD-10-CM

## 2016-04-13 DIAGNOSIS — Z931 Gastrostomy status: Secondary | ICD-10-CM

## 2016-04-13 DIAGNOSIS — R0989 Other specified symptoms and signs involving the circulatory and respiratory systems: Secondary | ICD-10-CM

## 2016-04-13 DIAGNOSIS — S24109D Unspecified injury at unspecified level of thoracic spinal cord, subsequent encounter: Secondary | ICD-10-CM

## 2016-04-13 DIAGNOSIS — J90 Pleural effusion, not elsewhere classified: Secondary | ICD-10-CM

## 2016-04-13 DIAGNOSIS — F1210 Cannabis abuse, uncomplicated: Secondary | ICD-10-CM

## 2016-04-13 DIAGNOSIS — J9383 Other pneumothorax: Secondary | ICD-10-CM

## 2016-04-13 DIAGNOSIS — B9562 Methicillin resistant Staphylococcus aureus infection as the cause of diseases classified elsewhere: Secondary | ICD-10-CM

## 2016-04-13 DIAGNOSIS — S069X9D Unspecified intracranial injury with loss of consciousness of unspecified duration, subsequent encounter: Secondary | ICD-10-CM

## 2016-04-13 DIAGNOSIS — T1490XA Injury, unspecified, initial encounter: Secondary | ICD-10-CM

## 2016-04-13 DIAGNOSIS — R0682 Tachypnea, not elsewhere classified: Secondary | ICD-10-CM

## 2016-04-13 DIAGNOSIS — J939 Pneumothorax, unspecified: Secondary | ICD-10-CM

## 2016-04-13 DIAGNOSIS — L8990 Pressure ulcer of unspecified site, unspecified stage: Secondary | ICD-10-CM | POA: Insufficient documentation

## 2016-04-13 DIAGNOSIS — J15212 Pneumonia due to Methicillin resistant Staphylococcus aureus: Secondary | ICD-10-CM

## 2016-04-13 DIAGNOSIS — Z5189 Encounter for other specified aftercare: Secondary | ICD-10-CM

## 2016-04-13 DIAGNOSIS — S069X9A Unspecified intracranial injury with loss of consciousness of unspecified duration, initial encounter: Secondary | ICD-10-CM

## 2016-04-13 DIAGNOSIS — R000 Tachycardia, unspecified: Secondary | ICD-10-CM

## 2016-04-13 DIAGNOSIS — Z930 Tracheostomy status: Secondary | ICD-10-CM

## 2016-04-13 LAB — CBC
HCT: 26.9 % — ABNORMAL LOW (ref 39.0–52.0)
Hemoglobin: 8.8 g/dL — ABNORMAL LOW (ref 13.0–17.0)
MCH: 28.2 pg (ref 26.0–34.0)
MCHC: 32.7 g/dL (ref 30.0–36.0)
MCV: 86.2 fL (ref 78.0–100.0)
PLATELETS: 305 10*3/uL (ref 150–400)
RBC: 3.12 MIL/uL — AB (ref 4.22–5.81)
RDW: 14.2 % (ref 11.5–15.5)
WBC: 9.1 10*3/uL (ref 4.0–10.5)

## 2016-04-13 LAB — CULTURE, BLOOD (ROUTINE X 2)
Culture: NO GROWTH
Culture: NO GROWTH

## 2016-04-13 MED ORDER — CLONAZEPAM 1 MG PO TABS
2.0000 mg | ORAL_TABLET | Freq: Two times a day (BID) | ORAL | Status: DC
  Administered 2016-04-13: 2 mg
  Filled 2016-04-13: qty 2

## 2016-04-13 MED ORDER — FREE WATER
100.0000 mL | Freq: Three times a day (TID) | Status: DC
  Administered 2016-04-13 – 2016-04-19 (×17): 100 mL

## 2016-04-13 MED ORDER — QUETIAPINE FUMARATE 100 MG PO TABS
100.0000 mg | ORAL_TABLET | Freq: Three times a day (TID) | ORAL | Status: DC
  Administered 2016-04-13 (×2): 100 mg
  Filled 2016-04-13 (×2): qty 1

## 2016-04-13 MED ORDER — FENTANYL CITRATE (PF) 100 MCG/2ML IJ SOLN
50.0000 ug | INTRAMUSCULAR | Status: DC | PRN
  Administered 2016-04-14: 50 ug via INTRAVENOUS
  Filled 2016-04-13: qty 2

## 2016-04-13 MED ORDER — TRAMADOL HCL 50 MG PO TABS
100.0000 mg | ORAL_TABLET | Freq: Four times a day (QID) | ORAL | Status: DC
  Administered 2016-04-13 – 2016-04-19 (×25): 100 mg
  Filled 2016-04-13 (×25): qty 2

## 2016-04-13 MED ORDER — ACETAMINOPHEN 160 MG/5ML PO SOLN
650.0000 mg | Freq: Four times a day (QID) | ORAL | Status: DC | PRN
  Administered 2016-04-13 – 2016-04-14 (×2): 650 mg
  Filled 2016-04-13 (×2): qty 20.3

## 2016-04-13 MED ORDER — HYDROCODONE-ACETAMINOPHEN 7.5-325 MG/15ML PO SOLN
10.0000 mL | ORAL | Status: DC | PRN
  Administered 2016-04-14: 20 mL via ORAL
  Administered 2016-04-14 – 2016-04-18 (×2): 15 mL via ORAL
  Filled 2016-04-13: qty 30
  Filled 2016-04-13 (×2): qty 15
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
## 2016-04-13 NOTE — Progress Notes (Signed)
No issues overnight. Pt reports back pain, improved with current meds.  EXAM:  BP 106/63   Pulse (!) 116   Temp 98.2 F (36.8 C) (Oral)   Resp (!) 31   Ht 5\' 6"  (1.676 m)   Wt 69.1 kg (152 lb 5.4 oz)   SpO2 99%   BMI 24.59 kg/m   Awake, alert, oriented  Speech fluent, appropriate  CN grossly intact  5/5 BUE Wound c/d/i  IMPRESSION:  21 y.o. male POD#1 T6-T10 stabilization of T8 fracture  PLAN: - Cont current mgmt - No positional/movement restrictions
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
## 2016-04-13 NOTE — Consult Note (Signed)
WOC Nurse wound consult note Reason for Consult: Consult requested for coccyx.  Bedside nurse describes a stage 2 pressure injury which is pink and moist. Foam dressing has been applied which is appropriate to protect and promote healing. Pressure Injury POA: No Please re-consult if further assistance is needed.  Thank-you,  Cammie Mcgeeawn Yanelie Abraha MSN, RN, CWOCN, [place], CNS [phone]

## 2016-04-13 NOTE — Progress Notes (Signed)
Report called to OR nurse. 1115 Patient transferred to OR holding area with nurse and transporter. Patient returned from the OR at 1700.
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
## 2016-04-13 NOTE — Progress Notes (Signed)
PT has evaluated pt. And is recommending IP Rehab.  Patient was screened by Makynzee Tigges for appropriateness for an Inpatient Acute Rehab consult.  At this time, we are recommending Inpatient Rehab consult.  Please order consult if you are agreeable.  Corrina Steffensen PT Inpatient Rehab Admissions Coordinator Cell [phone] Office [phone]   

## 2016-04-14 ENCOUNTER — Encounter (HOSPITAL_COMMUNITY): Payer: Self-pay | Admitting: Neurosurgery

## 2016-04-14 MED ORDER — CLONAZEPAM 1 MG PO TABS
1.0000 mg | ORAL_TABLET | Freq: Two times a day (BID) | ORAL | Status: DC
  Administered 2016-04-14 (×2): 1 mg
  Filled 2016-04-14 (×2): qty 1

## 2016-04-14 MED ORDER — QUETIAPINE FUMARATE 50 MG PO TABS
50.0000 mg | ORAL_TABLET | Freq: Three times a day (TID) | ORAL | Status: DC
  Administered 2016-04-14 (×3): 50 mg
  Filled 2016-04-14 (×2): qty 2
  Filled 2016-04-14: qty 1

## 2016-04-14 NOTE — Progress Notes (Signed)
Pt arrived from [id number]. VSS. TC. Oriented to unit and room. Family at bedside. CMT updated.

## 2016-04-14 NOTE — Progress Notes (Signed)
No issues overnight. Reports appropriate back pain, controlled well  EXAM:  BP 115/72   Pulse (!) 103   Temp 98.2 F (36.8 C) (Oral)   Resp (!) 29   Ht 5\' 6"  (1.676 m)   Wt 67.7 kg (149 lb 4 oz)   SpO2 100%   BMI 24.09 kg/m   Awake, alert, oriented  Speech fluent, appropriate  CN grossly intact  5/5 BUE Wound c/d/i  IMPRESSION:  21 y.o. male s/p T8 stabilization, at baseline  PLAN: - Cont supportive care - Can d/c staples in 2 weeks
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
## 2016-04-15 LAB — GLUCOSE, CAPILLARY: Glucose-Capillary: 117 mg/dL — ABNORMAL HIGH (ref 65–99)

## 2016-04-15 LAB — CREATININE, SERUM
Creatinine, Ser: 0.39 mg/dL — ABNORMAL LOW (ref 0.61–1.24)
GFR calc non Af Amer: >60 mL/min (ref >60)

## 2016-04-15 MED ORDER — CLONAZEPAM 0.5 MG PO TABS
0.5000 mg | ORAL_TABLET | Freq: Three times a day (TID) | ORAL | Status: DC
  Administered 2016-04-15 – 2016-04-19 (×13): 0.5 mg
  Filled 2016-04-15 (×13): qty 1

## 2016-04-15 MED ORDER — QUETIAPINE FUMARATE 50 MG PO TABS
50.0000 mg | ORAL_TABLET | Freq: Two times a day (BID) | ORAL | Status: DC
  Administered 2016-04-15 – 2016-04-19 (×9): 50 mg
  Filled 2016-04-15 (×9): qty 1

## 2016-04-15 MED ORDER — RESOURCE THICKENUP CLEAR PO POWD
ORAL | Status: DC | PRN
  Filled 2016-04-15 (×2): qty 125
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
## 2016-04-15 NOTE — Procedures (Signed)
Tracheostomy Change Note  Patient Details:   Name: [name] DOB: April 06, 1995 MRN: [id number]    Airway Documentation:     Evaluation  O2 sats: stable throughout Complications: No apparent complications Patient did tolerate procedure well. Bilateral Breath Sounds: Diminished, SPO2 100%, Changed Trach to #6 Uncuffed per MD order.  ETCO2 good color change.      Toula MoosCampbell, Trinetta Alemu Faulkner 04/15/2016, 3:38 PM

## 2016-04-15 NOTE — Progress Notes (Signed)
Attempted to call report-no answer 

## 2016-04-15 NOTE — Progress Notes (Addendum)
Inpatient Rehabilitation  Note improved medical stability and therapy participation today.  Plan to continue to follow for IP Rehab in hopes of admitting patient early next week.  Plan for my co-worker Weldon PickingSusan Blankenship to follow up Monday 1/22.  Please call with questions.   Charlane FerrettiMelissa Trace Cederberg, M.A., CCC/SLP Admission Coordinator  [facility]  Cell [phone]

## 2016-04-15 NOTE — Progress Notes (Signed)
Tolerating ATC well, no distress noted. SATs are stable.  family member at this bedside.

## 2016-04-15 NOTE — Progress Notes (Signed)
D/C'd Sutures where chest tubes were in situ.
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
## 2016-04-15 NOTE — Progress Notes (Signed)
Pt arrived to the as a transfer from 4E. Pt Alert and verbally responsive; peg in place with feedings going; Trach intact; surgical incision and pressure ulcer 2 dsg remains clean, dry and intact. Pt on air mattress; Pt oriented to the unit and room; family at beside; fall precaution and prevention education completed with pt and family at bedside; call light within reach; will closely monitor and report off to oncoming RN. Dionne BucyP. Amo Kelvin Burpee RN

## 2016-04-15 NOTE — Progress Notes (Signed)
Attempted to call report unable to take report at this time.

## 2016-04-15 NOTE — Progress Notes (Signed)
Report called pt to transfer to 5C12 via bed with belongings and family

## 2016-04-15 NOTE — Progress Notes (Signed)
Attempted to call report. Pt room to be changed to 5C12 as pt needs camera room due to trach. Will call back later.

## 2016-04-16 LAB — GLUCOSE, CAPILLARY
GLUCOSE-CAPILLARY: 111 mg/dL — AB (ref 65–99)
GLUCOSE-CAPILLARY: 117 mg/dL — AB (ref 65–99)
Glucose-Capillary: 113 mg/dL — ABNORMAL HIGH (ref 65–99)
Glucose-Capillary: 117 mg/dL — ABNORMAL HIGH (ref 65–99)
Glucose-Capillary: 129 mg/dL — ABNORMAL HIGH (ref 65–99)
Glucose-Capillary: 133 mg/dL — ABNORMAL HIGH (ref 65–99)
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
## 2016-04-17 LAB — GLUCOSE, CAPILLARY
GLUCOSE-CAPILLARY: 137 mg/dL — AB (ref 65–99)
GLUCOSE-CAPILLARY: 120 mg/dL — AB (ref 65–99)
Glucose-Capillary: 114 mg/dL — ABNORMAL HIGH (ref 65–99)
Glucose-Capillary: 120 mg/dL — ABNORMAL HIGH (ref 65–99)

## 2016-04-17 LAB — BASIC METABOLIC PANEL
Anion gap: 10 (ref 5–15)
BUN: 18 mg/dL (ref 6–20)
CHLORIDE: 98 mmol/L — AB (ref 101–111)
CO2: 25 mmol/L (ref 22–32)
CREATININE: 0.34 mg/dL — AB (ref 0.61–1.24)
Calcium: 9.4 mg/dL (ref 8.9–10.3)
GFR calc Af Amer: >60 mL/min (ref >60)
GFR calc non Af Amer: >60 mL/min (ref >60)
GLUCOSE: 91 mg/dL (ref 65–99)
Potassium: 4.4 mmol/L (ref 3.5–5.1)
Sodium: 133 mmol/L — ABNORMAL LOW (ref 135–145)

## 2016-04-17 LAB — CBC
HEMATOCRIT: 30.2 % — AB (ref 39.0–52.0)
HEMOGLOBIN: 9.7 g/dL — AB (ref 13.0–17.0)
MCH: 27.5 pg (ref 26.0–34.0)
MCHC: 32.1 g/dL (ref 30.0–36.0)
MCV: 85.6 fL (ref 78.0–100.0)
Platelets: 376 10*3/uL (ref 150–400)
RBC: 3.53 MIL/uL — ABNORMAL LOW (ref 4.22–5.81)
RDW: 13.7 % (ref 11.5–15.5)
WBC: 8.2 10*3/uL (ref 4.0–10.5)

## 2016-04-17 MED ORDER — PIVOT 1.5 CAL PO LIQD
500.0000 mL | ORAL | Status: DC
  Administered 2016-04-17 (×2): 500 mL
  Filled 2016-04-17 (×3): qty 1000

## 2016-04-17 MED ORDER — PRO-STAT SUGAR FREE PO LIQD
15.0000 mL | Freq: Every day | ORAL | Status: DC
  Administered 2016-04-18: 15 mL
  Filled 2016-04-17: qty 30
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
## 2016-04-18 MED ORDER — PIVOT 1.5 CAL PO LIQD
1000.0000 mL | ORAL | Status: DC
  Administered 2016-04-18: 1000 mL
  Filled 2016-04-18 (×2): qty 1000
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
## 2016-04-18 NOTE — Progress Notes (Signed)
Inpatient Rehabilitation  I met with the patient, his sister and his mother at the bedside to provide an update that we continue to follow for medical readiness and for bed availability.  As of now, I do not have bed availability for today.  I have spoken with Ellan Lambert, RNCM with this update.  I anticipate tight bed availability over the next day or so.  Will continue to follow along in hopes of bringing pt. to IP Rehab. Please call if questions.  [facility] Admissions Coordinator Cell [phone] Office [phone]
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
## 2016-04-18 NOTE — Consult Note (Addendum)
WOC Nurse wound consult note Coccyx pressure injury was reported to be stage 2 when it was first noted by bedside nurse on 1/16. It has evolved into a stage 3 pressure injury when assessed on 1/22. Pt is very thin with protruding sacrum bone over the location and he has been critically ill for a long period of time. Mother at bedside to view affected area and discuss the plan of care. 2X2X.2cm, red and moist. Foam dressing applied to protect and promote healing, pt has air mattress to decrease pressure. Pressure Injury POA: No Please re-consult if further assistance is needed. Thank-you,  Cammie Mcgeeawn Juvon Teater MSN, RN, CWOCN, RexWCN-AP, CNS [phone]

## 2016-04-18 NOTE — Progress Notes (Signed)
Patient is on room air humidified at this time tolerating well. RT will monitor.

## 2016-04-19 ENCOUNTER — Inpatient Hospital Stay (HOSPITAL_COMMUNITY)
Admission: RE | Admit: 2016-04-19 | Discharge: 2016-05-13 | DRG: 949 | Disposition: A | Discharge status: Home / Self Care | Payer: Medicaid Other | Source: Intra-hospital | Attending: Physical Medicine & Rehabilitation | Admitting: Physical Medicine & Rehabilitation

## 2016-04-19 ENCOUNTER — Encounter (HOSPITAL_COMMUNITY): Payer: Self-pay | Admitting: *Deleted

## 2016-04-19 DIAGNOSIS — S42025S Nondisplaced fracture of shaft of left clavicle, sequela: Secondary | ICD-10-CM | POA: Diagnosis not present

## 2016-04-19 DIAGNOSIS — Z931 Gastrostomy status: Secondary | ICD-10-CM

## 2016-04-19 DIAGNOSIS — G8220 Paraplegia, unspecified: Secondary | ICD-10-CM

## 2016-04-19 DIAGNOSIS — S42022S Displaced fracture of shaft of left clavicle, sequela: Secondary | ICD-10-CM

## 2016-04-19 DIAGNOSIS — S2232XD Fracture of one rib, left side, subsequent encounter for fracture with routine healing: Secondary | ICD-10-CM | POA: Diagnosis not present

## 2016-04-19 DIAGNOSIS — S2220XD Unspecified fracture of sternum, subsequent encounter for fracture with routine healing: Secondary | ICD-10-CM

## 2016-04-19 DIAGNOSIS — S24109S Unspecified injury at unspecified level of thoracic spinal cord, sequela: Secondary | ICD-10-CM

## 2016-04-19 DIAGNOSIS — S42022A Displaced fracture of shaft of left clavicle, initial encounter for closed fracture: Secondary | ICD-10-CM

## 2016-04-19 DIAGNOSIS — L89153 Pressure ulcer of sacral region, stage 3: Secondary | ICD-10-CM | POA: Diagnosis not present

## 2016-04-19 DIAGNOSIS — S2221XD Fracture of manubrium, subsequent encounter for fracture with routine healing: Secondary | ICD-10-CM

## 2016-04-19 DIAGNOSIS — S069X9A Unspecified intracranial injury with loss of consciousness of unspecified duration, initial encounter: Secondary | ICD-10-CM | POA: Diagnosis present

## 2016-04-19 DIAGNOSIS — S069X3S Unspecified intracranial injury with loss of consciousness of 1 hour to 5 hours 59 minutes, sequela: Secondary | ICD-10-CM | POA: Diagnosis not present

## 2016-04-19 DIAGNOSIS — G8918 Other acute postprocedural pain: Secondary | ICD-10-CM

## 2016-04-19 DIAGNOSIS — S22071D Stable burst fracture of T9-T10 vertebra, subsequent encounter for fracture with routine healing: Secondary | ICD-10-CM | POA: Diagnosis not present

## 2016-04-19 DIAGNOSIS — K5900 Constipation, unspecified: Secondary | ICD-10-CM

## 2016-04-19 DIAGNOSIS — S22051D Stable burst fracture of T5-T6 vertebra, subsequent encounter for fracture with routine healing: Secondary | ICD-10-CM

## 2016-04-19 DIAGNOSIS — S24109A Unspecified injury at unspecified level of thoracic spinal cord, initial encounter: Secondary | ICD-10-CM | POA: Diagnosis present

## 2016-04-19 DIAGNOSIS — S24103D Unspecified injury at T7-T10 level of thoracic spinal cord, subsequent encounter: Principal | ICD-10-CM

## 2016-04-19 DIAGNOSIS — S2231XD Fracture of one rib, right side, subsequent encounter for fracture with routine healing: Secondary | ICD-10-CM

## 2016-04-19 DIAGNOSIS — T148XXA Other injury of unspecified body region, initial encounter: Secondary | ICD-10-CM | POA: Diagnosis not present

## 2016-04-19 DIAGNOSIS — Z981 Arthrodesis status: Secondary | ICD-10-CM

## 2016-04-19 DIAGNOSIS — S069XAA Unspecified intracranial injury with loss of consciousness status unknown, initial encounter: Secondary | ICD-10-CM | POA: Diagnosis present

## 2016-04-19 DIAGNOSIS — J939 Pneumothorax, unspecified: Secondary | ICD-10-CM | POA: Diagnosis present

## 2016-04-19 DIAGNOSIS — D62 Acute posthemorrhagic anemia: Secondary | ICD-10-CM

## 2016-04-19 DIAGNOSIS — K592 Neurogenic bowel, not elsewhere classified: Secondary | ICD-10-CM

## 2016-04-19 DIAGNOSIS — R52 Pain, unspecified: Secondary | ICD-10-CM

## 2016-04-19 DIAGNOSIS — S0219XD Other fracture of base of skull, subsequent encounter for fracture with routine healing: Secondary | ICD-10-CM

## 2016-04-19 DIAGNOSIS — N319 Neuromuscular dysfunction of bladder, unspecified: Secondary | ICD-10-CM

## 2016-04-19 DIAGNOSIS — R1310 Dysphagia, unspecified: Secondary | ICD-10-CM

## 2016-04-19 DIAGNOSIS — S22062D Unstable burst fracture of T7-T8 vertebra, subsequent encounter for fracture with routine healing: Secondary | ICD-10-CM | POA: Diagnosis not present

## 2016-04-19 DIAGNOSIS — S27322D Contusion of lung, bilateral, subsequent encounter: Secondary | ICD-10-CM

## 2016-04-19 DIAGNOSIS — S069X0D Unspecified intracranial injury without loss of consciousness, subsequent encounter: Secondary | ICD-10-CM | POA: Diagnosis not present

## 2016-04-19 DIAGNOSIS — D72829 Elevated white blood cell count, unspecified: Secondary | ICD-10-CM

## 2016-04-19 DIAGNOSIS — M7989 Other specified soft tissue disorders: Secondary | ICD-10-CM | POA: Diagnosis not present

## 2016-04-19 DIAGNOSIS — S270XXD Traumatic pneumothorax, subsequent encounter: Secondary | ICD-10-CM | POA: Diagnosis not present

## 2016-04-19 DIAGNOSIS — S24109D Unspecified injury at unspecified level of thoracic spinal cord, subsequent encounter: Secondary | ICD-10-CM | POA: Diagnosis not present

## 2016-04-19 DIAGNOSIS — F4323 Adjustment disorder with mixed anxiety and depressed mood: Secondary | ICD-10-CM | POA: Diagnosis not present

## 2016-04-19 DIAGNOSIS — S0181XD Laceration without foreign body of other part of head, subsequent encounter: Secondary | ICD-10-CM

## 2016-04-19 DIAGNOSIS — S42002D Fracture of unspecified part of left clavicle, subsequent encounter for fracture with routine healing: Secondary | ICD-10-CM | POA: Diagnosis not present

## 2016-04-19 DIAGNOSIS — S27331D Laceration of lung, unilateral, subsequent encounter: Secondary | ICD-10-CM

## 2016-04-19 DIAGNOSIS — R112 Nausea with vomiting, unspecified: Secondary | ICD-10-CM

## 2016-04-19 DIAGNOSIS — S12601D Unspecified nondisplaced fracture of seventh cervical vertebra, subsequent encounter for fracture with routine healing: Secondary | ICD-10-CM | POA: Diagnosis not present

## 2016-04-19 DIAGNOSIS — Z930 Tracheostomy status: Secondary | ICD-10-CM

## 2016-04-19 DIAGNOSIS — R269 Unspecified abnormalities of gait and mobility: Secondary | ICD-10-CM | POA: Diagnosis not present

## 2016-04-19 DIAGNOSIS — R000 Tachycardia, unspecified: Secondary | ICD-10-CM

## 2016-04-19 DIAGNOSIS — S069X2S Unspecified intracranial injury with loss of consciousness of 31 minutes to 59 minutes, sequela: Secondary | ICD-10-CM

## 2016-04-19 MED ORDER — ENOXAPARIN SODIUM 40 MG/0.4ML ~~LOC~~ SOLN: 40.0000 mg | SUBCUTANEOUS | Status: DC

## 2016-04-19 MED ORDER — TRAMADOL HCL 50 MG PO TABS
100.0000 mg | ORAL_TABLET | Freq: Four times a day (QID) | ORAL | Status: DC
  Administered 2016-04-19 – 2016-04-28 (×34): 100 mg
  Filled 2016-04-19 (×34): qty 2

## 2016-04-19 MED ORDER — HYDROCODONE-ACETAMINOPHEN 7.5-325 MG/15ML PO SOLN: 10.0000 mL | ORAL | Status: DC | PRN

## 2016-04-19 MED ORDER — METOPROLOL TARTRATE 25 MG/10 ML ORAL SUSPENSION
25.0000 mg | Freq: Two times a day (BID) | ORAL | Status: DC
  Administered 2016-04-19 – 2016-04-22 (×7): 25 mg
  Filled 2016-04-19 (×7): qty 10

## 2016-04-19 MED ORDER — PIVOT 1.5 CAL PO LIQD
1000.0000 mL | ORAL | Status: DC
  Filled 2016-04-19 (×2): qty 1000

## 2016-04-19 MED ORDER — FREE WATER
100.0000 mL | Freq: Three times a day (TID) | Status: DC
  Administered 2016-04-19 – 2016-04-22 (×8): 100 mL

## 2016-04-19 MED ORDER — POLYETHYLENE GLYCOL 3350 17 G PO PACK
17.0000 g | PACK | Freq: Every day | ORAL | Status: DC
  Administered 2016-04-20 – 2016-04-28 (×6): 17 g via ORAL
  Filled 2016-04-19 (×12): qty 1

## 2016-04-19 MED ORDER — BACITRACIN ZINC 500 UNIT/GM EX OINT
TOPICAL_OINTMENT | Freq: Two times a day (BID) | CUTANEOUS | Status: DC
  Administered 2016-04-19 – 2016-04-21 (×3): via TOPICAL
  Filled 2016-04-19: qty 28.35

## 2016-04-19 MED ORDER — CLONAZEPAM 0.5 MG PO TABS
0.5000 mg | ORAL_TABLET | Freq: Three times a day (TID) | ORAL | Status: DC
  Administered 2016-04-19 – 2016-04-20 (×4): 0.5 mg
  Filled 2016-04-19 (×5): qty 1

## 2016-04-19 MED ORDER — ENOXAPARIN SODIUM 40 MG/0.4ML ~~LOC~~ SOLN
40.0000 mg | SUBCUTANEOUS | Status: DC
  Administered 2016-04-20 – 2016-05-13 (×24): 40 mg via SUBCUTANEOUS
  Filled 2016-04-19 (×24): qty 0.4

## 2016-04-19 MED ORDER — ONDANSETRON HCL 4 MG PO TABS
4.0000 mg | ORAL_TABLET | Freq: Four times a day (QID) | ORAL | Status: DC | PRN
  Administered 2016-05-11: 4 mg via ORAL
  Filled 2016-04-19: qty 1

## 2016-04-19 MED ORDER — RESOURCE THICKENUP CLEAR PO POWD
ORAL | Status: DC | PRN
  Filled 2016-04-19: qty 125

## 2016-04-19 MED ORDER — ACETAMINOPHEN 160 MG/5ML PO SOLN
650.0000 mg | Freq: Four times a day (QID) | ORAL | Status: DC | PRN
Start: 2016-04-19 — End: 2016-04-28

## 2016-04-19 MED ORDER — PANTOPRAZOLE SODIUM 40 MG PO PACK
40.0000 mg | PACK | Freq: Every day | ORAL | Status: DC
  Administered 2016-04-20 – 2016-04-23 (×4): 40 mg
  Filled 2016-04-19 (×3): qty 20

## 2016-04-19 MED ORDER — QUETIAPINE FUMARATE 50 MG PO TABS
50.0000 mg | ORAL_TABLET | Freq: Two times a day (BID) | ORAL | Status: DC
Start: 2016-04-19 — End: 2016-04-22
  Administered 2016-04-19 – 2016-04-22 (×6): 50 mg
  Filled 2016-04-19 (×6): qty 1

## 2016-04-19 MED ORDER — ONDANSETRON HCL 4 MG/2ML IJ SOLN
4.0000 mg | Freq: Four times a day (QID) | INTRAMUSCULAR | Status: DC | PRN
  Filled 2016-04-19: qty 2
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
## 2016-04-19 NOTE — Progress Notes (Signed)
Patient ID: Luis [name], male   DOB: [birth year], 21 y.o.   MRN: [id number] Patient and family arrived from [facility] with RN and patient belongings. Patient and family oriented to room, rehab process, schedule, fall prevention plan, rehab safety plan, and health resource notebook with verbal understanding. Patient and family encouraged to asked questions of staff when they have questions. Patient resting comfortably in air mattress overlay with no complaints of pain.

## 2016-04-19 NOTE — Progress Notes (Signed)
Inpatient Rehabilitation  I have spoken with Charma IgoMichael Jeffery, PA.  He has medically cleared pt. for CIR today.  Pt. and family are agreeable and pleased.  I will make all arrangements for admission today.  I have updated Sidney AceJulie Amerson, RNCM as well as pt's RN, Chrissie NoaWilliam.  Please call if questions.  Weldon PickingSusan Tyreece Gelles PT Inpatient Rehab Admissions Coordinator Cell [phone] Office [phone]

## 2016-04-20 ENCOUNTER — Inpatient Hospital Stay (HOSPITAL_COMMUNITY): Payer: Medicaid Other

## 2016-04-20 ENCOUNTER — Inpatient Hospital Stay (HOSPITAL_COMMUNITY): Payer: Medicaid Other | Admitting: Physical Therapy

## 2016-04-20 ENCOUNTER — Inpatient Hospital Stay (HOSPITAL_COMMUNITY): Payer: Medicaid Other | Admitting: Speech Pathology

## 2016-04-20 ENCOUNTER — Inpatient Hospital Stay (HOSPITAL_COMMUNITY): Payer: Self-pay

## 2016-04-20 DIAGNOSIS — M7989 Other specified soft tissue disorders: Secondary | ICD-10-CM

## 2016-04-20 DIAGNOSIS — S069X3S Unspecified intracranial injury with loss of consciousness of 1 hour to 5 hours 59 minutes, sequela: Secondary | ICD-10-CM

## 2016-04-20 LAB — CBC WITH DIFFERENTIAL/PLATELET
Basophils Absolute: 0 10*3/uL (ref 0.0–0.1)
Basophils Relative: 0 %
EOS PCT: 15 %
Eosinophils Absolute: 1.8 10*3/uL — ABNORMAL HIGH (ref 0.0–0.7)
HCT: 31.9 % — ABNORMAL LOW (ref 39.0–52.0)
Hemoglobin: 10.4 g/dL — ABNORMAL LOW (ref 13.0–17.0)
LYMPHS ABS: 2.5 10*3/uL (ref 0.7–4.0)
LYMPHS PCT: 21 %
MCH: 27.8 pg (ref 26.0–34.0)
MCHC: 32.6 g/dL (ref 30.0–36.0)
MCV: 85.3 fL (ref 78.0–100.0)
MONO ABS: 1 10*3/uL (ref 0.1–1.0)
MONOS PCT: 8 %
Neutro Abs: 6.7 10*3/uL (ref 1.7–7.7)
Neutrophils Relative %: 56 %
PLATELETS: 330 10*3/uL (ref 150–400)
RBC: 3.74 MIL/uL — ABNORMAL LOW (ref 4.22–5.81)
RDW: 14 % (ref 11.5–15.5)
WBC: 12 10*3/uL — ABNORMAL HIGH (ref 4.0–10.5)

## 2016-04-20 LAB — COMPREHENSIVE METABOLIC PANEL
ALT: 47 U/L (ref 17–63)
ANION GAP: 8 (ref 5–15)
AST: 37 U/L (ref 15–41)
Albumin: 2.7 g/dL — ABNORMAL LOW (ref 3.5–5.0)
Alkaline Phosphatase: 162 U/L — ABNORMAL HIGH (ref 38–126)
BUN: 18 mg/dL (ref 6–20)
CO2: 27 mmol/L (ref 22–32)
Calcium: 9.6 mg/dL (ref 8.9–10.3)
Chloride: 100 mmol/L — ABNORMAL LOW (ref 101–111)
Creatinine, Ser: 0.44 mg/dL — ABNORMAL LOW (ref 0.61–1.24)
Glucose, Bld: 102 mg/dL — ABNORMAL HIGH (ref 65–99)
POTASSIUM: 4.3 mmol/L (ref 3.5–5.1)
Sodium: 135 mmol/L (ref 135–145)
TOTAL PROTEIN: 6.9 g/dL (ref 6.5–8.1)
Total Bilirubin: 0.8 mg/dL (ref 0.3–1.2)

## 2016-04-20 MED ORDER — JEVITY 1.5 CAL/FIBER PO LIQD
237.0000 mL | Freq: Three times a day (TID) | ORAL | Status: DC
  Administered 2016-04-20 – 2016-04-21 (×3): 237 mL
  Filled 2016-04-20 (×13): qty 1000

## 2016-04-20 NOTE — Progress Notes (Signed)
Patient information reviewed and entered into eRehab system by Mkayla Steele, RN, CRRN, PPS Coordinator.  Information including medical coding and functional independence measure will be reviewed and updated through discharge.    
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
## 2016-04-20 NOTE — Procedures (Signed)
Tracheostomy Change Note  Patient Details:   Name: Luis [name] DOB: 07-Jul-1995 MRN: [id number]    Airway Documentation:     Evaluation  O2 sats: stable throughout Complications: No apparent complications Patient did tolerate procedure well. Bilateral Breath Sounds: Clear   Trach change done per MD order.  Pt downsized to a #4 cuffless.  Positive CO2 color change and equal BBS. Pt tolerated well.  RT will continue to monitor.     Tylene FantasiaHaley, Brysan Mcevoy Leigh 04/20/2016, 3:33 PM
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
## 2016-04-20 NOTE — Progress Notes (Signed)
Occupational Therapy Note  Patient Details  Name: Luis [name] MRN: [id number] Date of Birth: 12-Jan-1996  Today's Date: 04/20/2016 OT Individual Time: 1330-1400 OT Individual Time Calculation (min): 30 min   Pt denied pain Individual therapy  Pt resting in bed upon arrival with sister present.  Pt engaged in bed mobility tasks with mod a for BLE management.  Discussed goals and purpose of OT.  Pt remained in bed with sister present.     Lavone NeriLanier, Zara Wendt [facility] 04/20/2016, 2:50 PM

## 2016-04-20 NOTE — Progress Notes (Signed)
**  Preliminary report by tech**  Bilateral lower extremity venous duplex completed. There is no evidence of deep or superficial vein thrombosis involving the right and left lower extremities. All visualized vessels appear patent and compressible. There is no evidence of Baker's cysts bilaterally.  04/20/16 11:48 AM Olen CordialGreg Miko Markwood RVT

## 2016-04-20 NOTE — Plan of Care (Signed)
Problem: RH Balance Goal: LTG Patient will maintain dynamic standing balance (PT) LTG:  Patient will maintain dynamic standing balance with assistance during mobility activities (PT)  Outcome: Not Applicable Date Met: 92/76/39 Entered in error  Problem: RH Wheelchair Mobility Goal: LTG Patient will propel w/c in controlled environment (PT) LTG: Patient will propel wheelchair in controlled environment, # of feet with assist (PT)  100 ft Goal: LTG Patient will propel w/c in home environment (PT) LTG: Patient will propel wheelchair in home environment, # of feet with assistance (PT).  50 ft Goal: LTG Patient will propel w/c in community environment (PT) LTG: Patient will propel wheelchair in community environment, # of feet with assist (PT)  50 ft
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
## 2016-04-21 ENCOUNTER — Inpatient Hospital Stay (HOSPITAL_COMMUNITY): Payer: Self-pay

## 2016-04-21 ENCOUNTER — Inpatient Hospital Stay (HOSPITAL_COMMUNITY): Payer: Medicaid Other | Admitting: Speech Pathology

## 2016-04-21 ENCOUNTER — Inpatient Hospital Stay (HOSPITAL_COMMUNITY): Payer: Medicaid Other | Admitting: Occupational Therapy

## 2016-04-21 ENCOUNTER — Inpatient Hospital Stay (HOSPITAL_COMMUNITY): Payer: Self-pay | Admitting: Occupational Therapy

## 2016-04-21 ENCOUNTER — Inpatient Hospital Stay (HOSPITAL_COMMUNITY): Payer: Self-pay | Admitting: Physical Therapy

## 2016-04-21 DIAGNOSIS — G8220 Paraplegia, unspecified: Secondary | ICD-10-CM

## 2016-04-21 DIAGNOSIS — K592 Neurogenic bowel, not elsewhere classified: Secondary | ICD-10-CM

## 2016-04-21 DIAGNOSIS — N319 Neuromuscular dysfunction of bladder, unspecified: Secondary | ICD-10-CM

## 2016-04-21 MED ORDER — CLONAZEPAM 0.5 MG PO TABS
0.5000 mg | ORAL_TABLET | Freq: Two times a day (BID) | ORAL | Status: DC
  Administered 2016-04-21 – 2016-04-22 (×2): 0.5 mg
  Filled 2016-04-21 (×2): qty 1

## 2016-04-21 NOTE — Progress Notes (Signed)
Occupational Therapy Session Note  Patient Details  Name: Luis [name] MRN: [id number] Date of Birth: 07/21/1995  Today's Date: 04/21/2016 OT Individual Time: 1030-1100 OT Individual Time Calculation (min): 30 min    Short Term Goals: Week 1:  OT Short Term Goal 1 (Week 1): Bathe lower body at bed level with setup and mod assist OT Short Term Goal 2 (Week 1): Don pants with mod assist to perform rolls right to left  OT Short Term Goal 3 (Week 1): Groom at sink with setup and supervision OT Short Term Goal 4 (Week 1): Perform SB transfer bed<>w/c with mod assist.  Skilled Therapeutic Interventions/Progress Updates: "[name]" wanted this clinician to go over OT goals with he and  his sister.   Afterwards [name] was able to articulate/educate his sister on helping him to don his pants (as educated for evaluating OT).    As well, this clinician educated patient and sister on positioning patient abdominal binder for most effectiveness.  Patient left in the care of his supportive sister at the end of the session.      Therapy Documentation Precautions:  Precautions Precautions: Back, Cervical, Fall Precaution Comments: BLE Ace wraps & SCD's, LUE 90 degrees or less Required Braces or Orthoses: Cervical Brace Cervical Brace: Hard collar, At all times Restrictions Weight Bearing Restrictions: Yes LUE Weight Bearing: Non weight bearing Other Position/Activity Restrictions: due to clavicular fx   Pain:denied    See Function Navigator for Current Functional Status.   Therapy/Group: Individual Therapy  Bud Faceickett, Julie-Anne Torain [facility] 04/21/2016, 1:06 PM

## 2016-04-21 NOTE — Progress Notes (Signed)
Speech Language Pathology Daily Session Note  Patient Details  Name: Luis [name] MRN: [id number] Date of Birth: September 19, 1995  Today's Date: 04/21/2016 SLP Individual Time: 1100-1200 SLP Individual Time Calculation (min): 60 min  Short Term Goals: Week 1: SLP Short Term Goal 1 (Week 1): Patient will consume current diet with minimal overt s/s of aspiration with Mod I for use of swallowing compensatory strategies.  SLP Short Term Goal 2 (Week 1): Patient will consume trials of thin liquids with minimal overt s/s of aspiration to assess readiness for repeat MBS.  SLP Short Term Goal 3 (Week 1): Patient will donn/doff PMSV with Mod I.   Skilled Therapeutic Interventions: Skilled treatment session focused on dysphagia goals. SLP facilitated session by providing verbal and demonstration cues to patient's sister on how to appropriately donn/doff PMSV. She completed task independently. Patient also consumed trials of ice chips and thin liquids via cup with intermittent subtle throat clearing. However, patient with intermittent throat clearing prior to trials so difficult to differentiate. Recommend MBS tomorrow to assess swallowing function and possible upgrade. Patient also consumed his lunch meal of Dys. 3 textures with honey-thick liquids with minimal overt s/s of aspiration and was Mod I for use of swallowing compensatory strategies. Patient left upright in bed for patient to complete full supervision with meals. Continue with current plan of care.      Function:  Eating Eating   Modified Consistency Diet: Yes Eating Assist Level: More than reasonable amount of time;Set up assist for   Eating Set Up Assist For: Opening containers       Cognition Comprehension Comprehension assist level: Understands complex 90% of the time/cues 10% of the time  Expression   Expression assist level: Expresses complex 90% of the time/cues < 10% of the time  Social Interaction Social Interaction  assist level: Interacts appropriately with others with medication or extra time (anti-anxiety, antidepressant).  Problem Solving Problem solving assist level: Solves basic 90% of the time/requires cueing < 10% of the time  Memory Memory assist level: More than reasonable amount of time    Pain Pain Assessment Pain Assessment: No/denies pain  Therapy/Group: Individual Therapy  Sultana Tierney 04/21/2016, 3:08 PM

## 2016-04-21 NOTE — Progress Notes (Signed)
RT capped patient. Patient tolerated well. RT will monitor.

## 2016-04-21 NOTE — Progress Notes (Signed)
Occupational Therapy Session Note  Patient Details  Name: Luis [name] MRN: [id number] Date of Birth: [birth year]  Today's Date: 04/21/2016 OT Individual Time: 0900-1000 OT Individual Time Calculation (min): 60 min    Short Term Goals: Week 1:  OT Short Term Goal 1 (Week 1): Bathe lower body at bed level with setup and mod assist OT Short Term Goal 2 (Week 1): Don pants with mod assist to perform rolls right to left  OT Short Term Goal 3 (Week 1): Groom at sink with setup and supervision OT Short Term Goal 4 (Week 1): Perform SB transfer bed<>w/c with mod assist.  Skilled Therapeutic Interventions/Progress Updates:    Pt resting in bed upon arrival.  Pt engaged in BADL retraining including bathing/dressing at bed level.  Pt initiates rolling in bed but requires assistance to complete roll. Pt incontinent of bowel and required tot A to change brief and perform toilet hygiene.  Pt able to maintain sidelying with min A to facilitate hygiene.  Pt performed UB bathing tasks with min A and HOB elevated.  Pt performed UB dressing tasks with max A with HOB elevated.  Pt's foley bag was leaking and prevented donning of pants.  Pt remained in bed with RN present to change dressing on sacrum.  Therapy Documentation Precautions:  Precautions Precautions: Back, Cervical, Fall Precaution Comments: BLE Ace wraps & SCD's, LUE 90 degrees or less Required Braces or Orthoses: Cervical Brace Cervical Brace: Hard collar, At all times Restrictions Weight Bearing Restrictions: Yes LUE Weight Bearing: Non weight bearing Other Position/Activity Restrictions: due to clavicular fx  Pain: Pain Assessment Pain Assessment: No/denies pain ADL: ADL ADL Comments: see Functional Assessment  See Function Navigator for Current Functional Status.   Therapy/Group: Individual Therapy  Rich BraveLanier, [name] 04/21/2016, 10:00 AM

## 2016-04-21 NOTE — Progress Notes (Signed)
Occupational Therapy Note  Patient Details  Name: Luis [name] MRN: [id number] Date of Birth: 27-Jun-1995  Today's Date: 04/21/2016 OT Individual Time: 1300-1355 OT Individual Time Calculation (min): 55 min   Pt denied pain Individual Therapy  Pt resting in bed upon arrival with sister present.  Focus on bed mobility, long sitting, and circle sitting in bed.  Pt engaged in transitioning from sitting with HOB elevated sitting upright with use of RUE for support and without RUE support but mod a for balance from therapist.  Educated pt on progression and purpose of activity.  Pt assisted with repositioning in bed using RUE.  Bed malfunctioned during session and position of bed could not be changed.  Portable notified and bed to be switched.     Lavone NeriLanier, Shanard Treto [facility] 04/21/2016, 3:02 PM
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
## 2016-04-21 NOTE — Progress Notes (Signed)
Physical Therapy Note  Patient Details  Name: Luis [name] MRN: [id number] Date of Birth: 26-Sep-1995 Today's Date: 04/21/2016    Time: 730-825 55 minutes  1:1 No c/o pain. Pt performed bed mobility with max A for LEs and trunk. Sitting balance edge of bed with initial min A, progressing to close supervision with cues for wt shifts and 1 UE support.  Sliding board transfers throughout session with Max A to Rt with cues to pull with Rt UE, total A to Lt as pt with more difficulty with balance and coordinating movement this direction.  Sitting balance edge of mat with supervision - mod A with reaching task and hitting ball. Pt improving sitting balance, loses balance posteriorly more when fatigued. w/c mobility with bilat UEs (OK per MD) with min A, cues for steering, pt with no increased pain in Lt UE.  BP 110/68 sitting up after treatment.   DONAWERTH,KAREN 04/21/2016, 8:29 AM

## 2016-04-22 ENCOUNTER — Inpatient Hospital Stay (HOSPITAL_COMMUNITY): Payer: Self-pay | Admitting: Physical Therapy

## 2016-04-22 ENCOUNTER — Inpatient Hospital Stay (HOSPITAL_COMMUNITY): Payer: Medicaid Other | Admitting: Speech Pathology

## 2016-04-22 ENCOUNTER — Inpatient Hospital Stay (HOSPITAL_COMMUNITY): Payer: Self-pay

## 2016-04-22 ENCOUNTER — Inpatient Hospital Stay (HOSPITAL_COMMUNITY): Payer: Medicaid Other

## 2016-04-22 ENCOUNTER — Inpatient Hospital Stay (HOSPITAL_COMMUNITY): Payer: Self-pay | Admitting: Occupational Therapy

## 2016-04-22 DIAGNOSIS — K592 Neurogenic bowel, not elsewhere classified: Secondary | ICD-10-CM

## 2016-04-22 DIAGNOSIS — G8220 Paraplegia, unspecified: Secondary | ICD-10-CM

## 2016-04-22 LAB — CBC
HEMATOCRIT: 34.3 % — AB (ref 39.0–52.0)
HEMOGLOBIN: 10.9 g/dL — AB (ref 13.0–17.0)
MCH: 27.5 pg (ref 26.0–34.0)
MCHC: 31.8 g/dL (ref 30.0–36.0)
MCV: 86.4 fL (ref 78.0–100.0)
Platelets: 284 10*3/uL (ref 150–400)
RBC: 3.97 MIL/uL — AB (ref 4.22–5.81)
RDW: 14.2 % (ref 11.5–15.5)
WBC: 8.4 10*3/uL (ref 4.0–10.5)

## 2016-04-22 MED ORDER — FREE WATER
50.0000 mL | Freq: Every day | Status: DC
  Administered 2016-04-23 – 2016-05-11 (×19): 50 mL

## 2016-04-22 MED ORDER — QUETIAPINE FUMARATE 25 MG PO TABS
25.0000 mg | ORAL_TABLET | Freq: Two times a day (BID) | ORAL | Status: DC
  Administered 2016-04-22 – 2016-04-26 (×9): 25 mg
  Filled 2016-04-22 (×10): qty 1

## 2016-04-22 MED ORDER — METOPROLOL TARTRATE 25 MG PO TABS
25.0000 mg | ORAL_TABLET | Freq: Two times a day (BID) | ORAL | Status: DC
  Administered 2016-04-23 – 2016-05-04 (×23): 25 mg via ORAL
  Filled 2016-04-22 (×23): qty 1

## 2016-04-22 MED ORDER — CLONAZEPAM 0.5 MG PO TABS
0.2500 mg | ORAL_TABLET | Freq: Two times a day (BID) | ORAL | Status: DC
  Administered 2016-04-22 – 2016-04-29 (×14): 0.25 mg
  Filled 2016-04-22 (×14): qty 1

## 2016-04-22 NOTE — Progress Notes (Signed)
Occupational Therapy Note  Patient Details  Name: Luis [name] MRN: [id number] Date of Birth: 23-Dec-1995  Today's Date: 04/22/2016 OT Individual Time: 1400-1430 OT Individual Time Calculation (min): 30 min   Pt denied pain but did c/o bilateral scapula tenderness L>R; repositioned Individual therapy  Pt resting in bed upon arrival with sister present.  Continued education on LTG's and OT role. Discussed LUE NWB status and projected timeframe (8 wks from 12/19). Pt engaged in long sitting at bed level.  Pt able to assist pulling to upright position with RUE and min A.  Pt able to maintain static sitting balance without UE support.  Pt completed task X 3.     Lavone NeriLanier, Novice Vrba [facility] 04/22/2016, 2:36 PM

## 2016-04-22 NOTE — IPOC Note (Signed)
Overall Plan of Care [facility](IPOC) Patient Details Name: [name] MRN: [id number] DOB: 01/21/1996  Admitting Diagnosis: TBI SCI polytrauma  Hospital Problems: Principal Problem:   Spinal cord injury, thoracic region [facility](HCC) Active Problems:   Bilateral pneumothorax   TBI (traumatic brain injury) (HCC)   Closed fracture of shaft of left clavicle   Paraplegia (HCC)   Neurogenic bladder   Neurogenic bowel     Functional Problem List: Nursing Bladder, Bowel, Endurance, Medication Management, Pain, Safety, Skin Integrity  PT Balance, Endurance, Motor, Pain, Sensory, Safety, Skin Integrity  OT Balance, Endurance, Pain, Safety, Sensory, Motor  SLP    TR         Basic ADL's: OT Eating, Grooming, Bathing, Dressing, Toileting     Advanced  ADL's: OT       Transfers: PT Bed Mobility, Bed to Chair, Car, Occupational psychologisturniture  OT Toilet, Research scientist (life sciences)Tub/Shower     Locomotion: PT Wheelchair Mobility     Additional Impairments: OT Fuctional Use of Upper Extremity  SLP Swallowing      TR      Anticipated Outcomes Item Anticipated Outcome  Self Feeding Mod I  Swallowing  Mod I with least restrictive diet    Basic self-care  Min A  Toileting  Min A   Bathroom Transfers Mod A  Bowel/Bladder  Max assist with bowel and bladder  Transfers  min assist  Locomotion  supervision with w/c  Communication     Cognition     Pain  <3 on a 0-10 pain scale  Safety/Judgment  mod assist for safety and transfers   Therapy Plan: PT Intensity: Minimum of 1-2 x/day ,45 to 90 minutes PT Frequency: 5 out of 7 days PT Duration Estimated Length of Stay: 3 weeks OT Intensity: Minimum of 1-2 x/day, 45 to 90 minutes OT Frequency: 5 out of 7 days OT Duration/Estimated Length of Stay: 21-28 days SLP Intensity: Minumum of 1-2 x/day, 30 to 90 minutes SLP Frequency: 3 to 5 out of 7 days SLP Duration/Estimated Length of Stay: ~3 weeks       Team Interventions: Nursing Interventions Patient/Family  Education, Bladder Management, Bowel Management, Pain Management, Medication Management, Skin Care/Wound Management, Discharge Planning, Psychosocial Support, Dysphagia/Aspiration Precaution Training  PT interventions Discharge planning, DME/adaptive equipment instruction, Functional mobility training, Pain management, Psychosocial support, Splinting/orthotics, Therapeutic Activities, UE/LE Strength taining/ROM, Wheelchair propulsion/positioning, UE/LE Coordination activities, Therapeutic Exercise, Stair training, Skin care/wound management, Patient/family education, Neuromuscular re-education, Disease management/prevention, FirefighterCommunity reintegration, Warden/rangerBalance/vestibular training  OT Interventions Discharge planning, Warden/rangerBalance/vestibular training, Fish farm managerDME/adaptive equipment instruction, Functional mobility training, Neuromuscular re-education, Patient/family education, Self Care/advanced ADL retraining, Therapeutic Activities, Therapeutic Exercise, Wheelchair propulsion/positioning  SLP Interventions Functional tasks, Patient/family education, Financial traderCueing hierarchy, Dysphagia/aspiration precaution training, Therapeutic Activities, Environmental controls  TR Interventions    SW/CM Interventions Discharge Planning, Facilities managersychosocial Support, Patient/Family Education    Team Discharge Planning: Destination: PT-Home ,OT- Home , SLP-Home Projected Follow-up: PT-24 hour supervision/assistance, Home health PT, OT-   , SLP- (TBD) Projected Equipment Needs: PT-To be determined, OT- To be determined, SLP-To be determined Equipment Details: PT- , OT-  Patient/family involved in discharge planning: PT- Patient, Family member/caregiver,  OT-Patient, Family member/caregiver, SLP-Patient, Family member/caregiver  MD ELOS: 24 days Medical Rehab Prognosis:  Excellent Assessment: The patient has been admitted for CIR therapies with the diagnosis of Thoracic SCI/TBI. The team will be addressing functional mobility, strength, stamina,  balance, safety, adaptive techniques and equipment, self-care, bowel and bladder mgt, patient and caregiver education, NMR, pain mgt, ortho precautions, w/c assessment/use,  pain control, SCI education, community reintegration. Goals have been set at min assist for basic self-care and transfers, supervision for w/c locomotion, and max assist for bowel/bladder mgt. TLSO is limiting for self-care in particular  Ranelle Oyster, MD, [facility]      See Team Conference Notes for weekly updates to the plan of care

## 2016-04-22 NOTE — Progress Notes (Signed)
Occupational Therapy Session Note  Patient Details  Name: Luis [name] MRN: [id number] Date of Birth: 24-Jul-1995  Today's Date: 04/22/2016 OT Individual Time: 0700-0800 OT Individual Time Calculation (min): 60 min    Short Term Goals: Week 1:  OT Short Term Goal 1 (Week 1): Bathe lower body at bed level with setup and mod assist OT Short Term Goal 2 (Week 1): Don pants with mod assist to perform rolls right to left  OT Short Term Goal 3 (Week 1): Groom at sink with setup and supervision OT Short Term Goal 4 (Week 1): Perform SB transfer bed<>w/c with mod assist.  Skilled Therapeutic Interventions/Progress Updates:    Pt resting in bed upon arrival eating breakfast with assistance from father.  Pt engaged in BADL retraining including bathing/dressing at bed level.  Pt able to roll to either side with min A and use of bed rails.  Pt  Pt completed UB bathing tasks with supervision and HOB elevated.  Pt dons shirt with min A to pull shirt over trunk.  Pt assists with pulling his pants up after threaded over feet.  Pt engaged in unsupported long sitting in bed with min A.  Pt remained in bed awaiting transport for MBS.   Therapy Documentation Precautions:  Precautions Precautions: Back, Cervical, Fall Precaution Comments: BLE Ace wraps & SCD's, LUE 90 degrees or less Required Braces or Orthoses: Cervical Brace Cervical Brace: Hard collar, At all times Restrictions Weight Bearing Restrictions: Yes LUE Weight Bearing: Non weight bearing Other Position/Activity Restrictions: due to clavicular fx   See Function Navigator for Current Functional Status.   Therapy/Group: Individual Therapy  Rich BraveLanier, Shereda Graw Chappell 04/22/2016, 9:17 AM

## 2016-04-22 NOTE — Progress Notes (Signed)
Occupational Therapy Session Note  Patient Details  Name: Luis [name] MRN: [id number] Date of Birth: 05/07/1995  Today's Date: 04/22/2016 OT Individual Time: 1715-1745 OT Individual Time Calculation (min): 30 min    Short Term Goals: Week 1:  OT Short Term Goal 1 (Week 1): Bathe lower body at bed level with setup and mod assist OT Short Term Goal 2 (Week 1): Don pants with mod assist to perform rolls right to left  OT Short Term Goal 3 (Week 1): Groom at sink with setup and supervision OT Short Term Goal 4 (Week 1): Perform SB transfer bed<>w/c with mod assist.  Skilled Therapeutic Interventions/Progress Updates:   Dad present and educated on pressure relief (w/c rear tilt and hold and patient leaning to bed or stable sturdy surface on his right & close supervision by family member)  Patient participated in AROM L shoulder and elbow range of motion, gentle within his pain tolerance and keeping within L shoulder not past 90 degrees ROM restriction  Patient with right trapezius and rhomboid muscles both sides & informed on disposal hot packs and refillable cold packs from nsg. If desired  Patient left sitting in his w/c with his supportive father in the room by his side     Therapy Documentation Precautions:  Precautions Precautions: Back, Cervical, Fall Precaution Comments: BLE Ace wraps & SCD's, LUE 90 degrees or less Required Braces or Orthoses: Cervical Brace Cervical Brace: Hard collar, At all times Restrictions Weight Bearing Restrictions: Yes LUE Weight Bearing: Non weight bearing Other Position/Activity Restrictions: due to clavicular fx  Pain:denied   See Function Navigator for Current Functional Status.   Therapy/Group: Individual Therapy  Bud Faceickett, Jeffory Snelgrove [facility] 04/22/2016, 7:45 PM

## 2016-04-22 NOTE — Progress Notes (Signed)
Physical Therapy Note  Patient Details  Name: Luis [name] MRN: [id number] Date of Birth: 02-Jun-1995 Today's Date: 04/22/2016    Time: 1030-1100 30 minutes  1:1 No c/o pain. Session focused on PROM stretching program.  Pt's sister given handout and PT demonstrated LE PROM for hips and ankles. Pt tolerated well.  Time 2: 1500-1620 80 minutes  1:1 No c/o pain.  Pt performed rolling in bed with mod/max A for cleaning after incontinence of bowel.  Supine to sit with max A, cues to use Rt UE to push up.  Sitting balance edge of bed with initial min A, progressing to supervision with time and manual cues.  Sliding board transfers with max/total A for wt shifts, cues for use of Rt UE.  Sitting balance on mat with mod-supervision with reaching task and ball toss. Pt able to maintain sitting balance on mat in static position for 30 seconds without UE support, requires mod/max  A for reaching out of BOS.  Pt with delayed balance reactions.  Pt/sister educated on pressure relief techniques in w/c with handout and sister able to return demonstrate.  BP 105/57 at end of session, pt denies symptoms, RN made aware.   Bridgid Printz 04/22/2016, 4:23 PM

## 2016-04-22 NOTE — Care Management Note (Signed)
[facility]patient Rehabilitation [facility] Individual Statement of Services  Patient Name:  [name]  Date:  04/22/2016  Welcome to the [facility]patient [facility].  Our goal is to provide you with an individualized program based on your diagnosis and situation, designed to meet your specific needs.  With this comprehensive rehabilitation program, you will be expected to participate in at least 3 hours of rehabilitation therapies Monday-Friday, with modified therapy programming on the weekends.  Your rehabilitation program will include the following services:  Physical Therapy (PT), Occupational Therapy (OT), Speech Therapy (ST), 24 hour per day rehabilitation nursing, Therapeutic Recreaction (TR), Neuropsychology, Case Management (Social Worker), Rehabilitation Medicine, Nutrition Services and Pharmacy Services  Weekly team conferences will be held on Tuesdays to discuss your progress.  Your Social Worker will talk with you frequently to get your input and to update you on team discussions.  Team conferences with you and your family in attendance may also be held.  Expected length of stay: 21-28 days  Overall anticipated outcome: minimal assistance  Depending on your progress and recovery, your program may change. Your Social Worker will coordinate services and will keep you informed of any changes. Your Social Worker's name and contact numbers are listed  below.  The following services may also be recommended but are not provided by the Inpatient Rehabilitation Center:   Driving Evaluations  Home Health Rehabiltiation Services  Outpatient Rehabilitation Services  Vocational Rehabilitation   Arrangements will be made to provide these services after discharge if needed.  Arrangements include referral to agencies that provide these services.  Your insurance has been verified to be:  None (Mediciad application pending) Your primary doctor is:  None  Pertinent information will be  shared with your doctor and your insurance company.  Social Worker:  SanfordLucy Ankush Gintz, [place] [phone] or ([phone]) [phone]   Information discussed with and copy given to patient by: Amada JupiterHOYLE, Jana Swartzlander, 04/22/2016, 2:46 PM
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
## 2016-04-23 ENCOUNTER — Inpatient Hospital Stay (HOSPITAL_COMMUNITY): Payer: Medicaid Other | Admitting: Speech Pathology

## 2016-04-23 DIAGNOSIS — N319 Neuromuscular dysfunction of bladder, unspecified: Secondary | ICD-10-CM

## 2016-04-23 DIAGNOSIS — S069X0D Unspecified intracranial injury without loss of consciousness, subsequent encounter: Secondary | ICD-10-CM

## 2016-04-23 DIAGNOSIS — S24109D Unspecified injury at unspecified level of thoracic spinal cord, subsequent encounter: Secondary | ICD-10-CM

## 2016-04-23 MED ORDER — PANTOPRAZOLE SODIUM 40 MG PO TBEC
40.0000 mg | DELAYED_RELEASE_TABLET | Freq: Every day | ORAL | Status: DC
  Administered 2016-04-24 – 2016-05-13 (×20): 40 mg via ORAL
  Filled 2016-04-23 (×21): qty 1

## 2016-04-23 NOTE — Progress Notes (Signed)
Called to room by SLP.  Trach hanging out of neck.  No respiratory difficulty noted.  Chest expansion even bilaterally, no SOB noted. Patient denies discomfort.  Respiratory Therapy called to check patient and trach.  Tolerated episode without any distress of any kind.

## 2016-04-23 NOTE — Progress Notes (Signed)
Nurse called and said the Pt's trach looked like it was coming out. RT entered the room and the C-Collars velcro had hooked on the trach ties and was not secured on the trach, RT advanced the trach and Pt's SATS and BBS were good. RT will continue to monitor.
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
## 2016-04-23 NOTE — Progress Notes (Signed)
Speech Language Pathology Daily Session Note  Patient Details  Name: Luis [name] MRN: [id number] Date of Birth: 1995/11/05  Today's Date: 04/23/2016 SLP Individual Time: [id number] SLP Individual Time Calculation (min): 45 min  Short Term Goals: Week 1: SLP Short Term Goal 1 (Week 1): Patient will consume current diet with minimal overt s/s of aspiration with Mod I for use of swallowing compensatory strategies.  SLP Short Term Goal 2 (Week 1): Patient will consume trials of thin liquids with minimal overt s/s of aspiration to assess readiness for repeat MBS.  SLP Short Term Goal 3 (Week 1): Patient will donn/doff PMSV with Mod I.   Skilled Therapeutic Interventions: Skilled treatment session focused on dysphagia goals. SLP facilitated session by providing skilled observation of thin liquids via straw. Pt consumed 18 oz thin liquids via straw without overt s/;s of aspiration. Pt trach remains capped however it was mispositioned when SLP entered room, RT paged and positioned. Pt tolerated well without any change in vitals or ability to consume PO intake. Pt left upright in bed with mother present, all needs within reach. Continue current plan of care.      Function:  Eating Eating   Modified Consistency Diet: No Eating Assist Level: More than reasonable amount of time;Set up assist for   Eating Set Up Assist For: Opening containers       Cognition Comprehension Comprehension assist level: Understands complex 90% of the time/cues 10% of the time  Expression   Expression assist level: Expresses complex 90% of the time/cues < 10% of the time  Social Interaction Social Interaction assist level: Interacts appropriately with others with medication or extra time (anti-anxiety, antidepressant).  Problem Solving Problem solving assist level: Solves basic 90% of the time/requires cueing < 10% of the time  Memory Memory assist level: More than reasonable amount of time    Pain     Therapy/Group: Individual Therapy Ephraim Reichel B. Dreama Saaverton, M.S., CCC-SLP Speech-Language Pathologist  Marqual Mi 04/23/2016, 10:56 AM

## 2016-04-24 ENCOUNTER — Inpatient Hospital Stay (HOSPITAL_COMMUNITY): Payer: Medicaid Other

## 2016-04-24 ENCOUNTER — Inpatient Hospital Stay (HOSPITAL_COMMUNITY): Payer: Medicaid Other | Admitting: Physical Therapy

## 2016-04-24 NOTE — Progress Notes (Signed)
Occupational Therapy Session Note  Patient Details  Name: Luis [name] MRN: [id number] Date of Birth: Jan 08, 1996  Today's Date: 04/24/2016 OT Individual Time: 1030-1130 OT Individual Time Calculation (min): 60 min   Short Term Goals: Week 1:  OT Short Term Goal 1 (Week 1): Bathe lower body at bed level with setup and mod assist OT Short Term Goal 2 (Week 1): Don pants with mod assist to perform rolls right to left  OT Short Term Goal 3 (Week 1): Groom at sink with setup and supervision OT Short Term Goal 4 (Week 1): Perform SB transfer bed<>w/c with mod assist.  Skilled Therapeutic Interventions/Progress Updates:  ADL-retraining at bed level with focus on improved lower body dressing, AE training (reacher), and SB transfers.   Pt received supine in bed with father present during setup only.   Pt re-educated on operation of DME and positioning aids (HOB elevation control and rails) and AE use (reacher)  but requires assist to manage devices to enable reach with right arm to reposition and access to lower body.   +2 helper present to assist setup for efficient BADL and SB transfer safety only.   BADL performed at bed level during this session.   Pt washes peri-area and assists with washing buttocks after mod assist to roll to his right and left.   Pt confirms explanation of use of leg loops during the following week but requires total assist to manage BLE in order to don pants, TEDs and shoes and to rise from supine to sitting at EOB in prep for slide board transfer.   Pt performs SB transfer to his right side with total assist (+2 helper stabilizes w/c only).   Pt is able to self-propel w/c short distances but requires setup to place w/c at sink to groom at end of session with setup to provide suipplies.     Therapy Documentation Precautions:  Precautions Precautions: Back, Cervical, Fall Precaution Comments: BLE Ace wraps & SCD's, LUE 90 degrees or less Required Braces or Orthoses:  Cervical Brace Cervical Brace: Hard collar, At all times Restrictions Weight Bearing Restrictions: Yes LUE Weight Bearing: Non weight bearing Other Position/Activity Restrictions: due to clavicular fx    Vital Signs: Therapy Vitals Pulse Rate: 99 Resp: 16 BP: 115/67 Patient Position (if appropriate): Sitting Oxygen Therapy O2 Device: Not Delivered   Pain: No/denies pain  ADL: ADL ADL Comments: see Functional Assessment   See Function Navigator for Current Functional Status.   Therapy/Group: Individual Therapy   Second session: Time:  1330-1400 Time Calculation (min): 30   min  Pain Assessment: No/denies pain    Skilled Therapeutic Interventions:  Therapeutic activity with focus on core strengthening and adherence to NWB precaution at LUE.   Pt received seated in his w/c with father present.   OT educated pt and family on benefit of attempting static standing to assess orthostatic tolerance and promote core strengthening.   Pt escorted to rehab gym and setup in standing frame with father assisting placement of hip sling.   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 HR and BP elevated during session to 126 bpm. 155/110, although no symptoms reported.   Pt reports enjoying session as motivating him to work harder at improving postural control.   See FIM for current functional status  Therapy/Group: Individual Therapy  Luis [name] 04/24/2016, 12:54 PM
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
## 2016-04-24 NOTE — Progress Notes (Signed)
Dr. Amador CunasKwiatkowski notified of blood pressures 86/56 and 101/72, pulse 108.  Patient is experiencing no symptoms or pain at present.

## 2016-04-24 NOTE — Progress Notes (Signed)
Physical Therapy Session Note  Patient Details  Name: Luis [name] MRN: [id number] Date of Birth: [birth year]  Today's Date: 04/24/2016 PT Individual Time: [id number] and [id number] PT Individual Time Calculation (min): 58 min and 45 min  Short Term Goals: Week 1:  PT Short Term Goal 1 (Week 1): Pt will perform slide board transfers with mod assist +1. PT Short Term Goal 2 (Week 1): Pt will demonstrate static sitting balance with min assist. PT Short Term Goal 3 (Week 1): Pt will propel w/c 75 ft with min assist.  Skilled Therapeutic Interventions/Progress Updates:    Treatment 1: Pt received in bed with father present for session. Pt denied c/o pain. Session focused on slide board transfers, sitting balance, and pt education. Throughout session therapist educated pt on back precautions & NWB LUE and ROM restrictions. Pt completed slide board transfers w/c<>bed, w/c<>mat table with + 2 assist (max assist & +2 assist for safety). Pt requires education and multimodal cuing for head/hips relationship, sequencing, & technique of transfer. Pt able to initiate and assist with weight shifting across board, with improved ability to scoot to left by pushing with R arm. Pt tolerated sitting on edge of mat ~30 minutes with close supervision<>min assist with activity focusing on static & dynamic sitting balance, postural righting reactions, and core strengthening (pt participated in playing horseshoes for challenges to balance). Therapist donned pt's ted hose total assist while pt sat EOM. Pt's vitals during session are as follows (pt asymptomatic throughout session):  Sitting EOM without Ted Hose: BP = 106/61 mmHg HR = 125 bpm  Sitting EOB with ted hose donned: BP = 105/66 mmHg HR = 129 bpm  Sitting in w/c after transferring from mat, ted hose donned: BP = 116/66 mmHg HR = 118 bpm  At end of session pt left in bed with all needs within reach & father present to supervise.   Treatment 2:  Pt received in handoff from OT. Pt denied c/o pain & agreeable to tx with father present for session. Session focused on transfers to car and w/c<>mat table, w/c mobility, and sitting balance. Pt requires +2 for all transfers (w/c<>mat table and w/c<>level car seat). Pt requires total assist for w/c & slide board set up with therapist educating pt on management of w/c armrest. Pt able to initiate movement and assist with sliding buttocks across board but continues to require max assist for balance, posture, and technique. Pt with difficulty maintaining balance with significant anterior weight shifting during transfers. While sitting on mat table to participated in tasks challenging his balance, including reaching for cones without BUE support, transferring cones to L<>R on mat table, and lateral leans/pushups with RUE to prepare for slide board transfers. Pt requires close supervision<>min assist to maintain balance. Pt reports he does not like sitting in TIS w/c, but therapist educated pt on benefits of pressure relieving in TIS w/c. Placed gel cushion in manual w/c for increased comfort. Pt propelled w/c throughout unit with BUE & supervision, without c/o pain in LUE. At end of session pt left sitting in w/c in room with father present to supervise.   Therapy Documentation Precautions:  Precautions Precautions: Back, Cervical, Fall Precaution Comments: BLE Ace wraps & SCD's, LUE 90 degrees or less Required Braces or Orthoses: Cervical Brace Cervical Brace: Hard collar, At all times Restrictions Weight Bearing Restrictions: Yes LUE Weight Bearing: Non weight bearing Other Position/Activity Restrictions: due to clavicular fx     See Function Navigator for Current Functional Status.  Therapy/Group: Individual Therapy  Sandi Mariscal 04/24/2016, 7:21 PM

## 2016-04-25 ENCOUNTER — Inpatient Hospital Stay (HOSPITAL_COMMUNITY): Payer: Self-pay

## 2016-04-25 ENCOUNTER — Inpatient Hospital Stay (HOSPITAL_COMMUNITY): Payer: Medicaid Other | Admitting: Speech Pathology

## 2016-04-25 ENCOUNTER — Inpatient Hospital Stay (HOSPITAL_COMMUNITY): Payer: Medicaid Other | Admitting: Physical Therapy

## 2016-04-25 ENCOUNTER — Inpatient Hospital Stay (HOSPITAL_COMMUNITY): Payer: Medicaid Other | Admitting: Occupational Therapy

## 2016-04-25 LAB — BASIC METABOLIC PANEL
ANION GAP: 8 (ref 5–15)
BUN: 13 mg/dL (ref 6–20)
CO2: 26 mmol/L (ref 22–32)
Calcium: 9.2 mg/dL (ref 8.9–10.3)
Chloride: 101 mmol/L (ref 101–111)
Creatinine, Ser: 0.47 mg/dL — ABNORMAL LOW (ref 0.61–1.24)
GFR calc Af Amer: >60 mL/min (ref >60)
Glucose, Bld: 96 mg/dL (ref 65–99)
POTASSIUM: 4 mmol/L (ref 3.5–5.1)
SODIUM: 135 mmol/L (ref 135–145)

## 2016-04-25 NOTE — Plan of Care (Signed)
Problem: SCI BLADDER ELIMINATION Goal: RH STG MANAGE BLADDER WITH ASSISTANCE STG Manage Bladder With mod Assistance   Outcome: Not Progressing Foley catheter at present  Problem: RH SKIN INTEGRITY Goal: RH STG SKIN FREE OF INFECTION/BREAKDOWN Outcome: Not Progressing Stage 2 pressure ulcer on sacral area

## 2016-04-25 NOTE — Progress Notes (Signed)
Speech Language Pathology Discharge Summary  Patient Details  Name: Luis [name] MRN: [id number] Date of Birth: January 23, 1996  Today's Date: 04/25/2016 SLP Individual Time: 0720-0750 SLP Individual Time Calculation (min): 30 min   Skilled Therapeutic Interventions: Skilled treatment session focused on dysphagia goals. SLP facilitated session with skilled observation with breakfast meal of regular textures with thin liquids. Patient consumed meal without overt s/s of aspiration and was Mod I for use of swallowing compensatory strategies. Patient and family education complete and patient will be discharged from skilled SLP intervention due to all goals being met at this time. Patient left upright in bed with father present. Continue with current plan of care.   Patient has met 3 of 3 long term goals.  Patient to discharge at overall Modified Independent level.   Reasons goals not met: N/A   Clinical Impression/Discharge Summary: Patient has made excellent gains and has met 3 of 3 LTG's this admission. Currently, patient is consuming regular textures with thin liquids without overt s/s of aspiration and is Mod I for use of swallowing compensatory strategies. Patient and family education is complete and patient will be discharged from skilled SLP intervention due to all goals being met at this time. Patient is at his baseline level of cognitive functioning, therefore, skilled SLP f/u is not warranted at this time.   Care Partner:  Caregiver Able to Provide Assistance: Yes     Recommendation:  None      Equipment: N/A   Reasons for discharge: Treatment goals met   Patient/Family Agrees with Progress Made and Goals Achieved: Yes   Function:  Eating Eating   Modified Consistency Diet: No Eating Assist Level: Swallowing techniques: self managed           Cognition Comprehension Comprehension assist level: Follows complex conversation/direction with no assist  Expression  Expression assistive device: Talk trach valve Expression assist level: Expresses complex ideas: With no assist  Social Interaction Social Interaction assist level: Interacts appropriately with others - No medications needed.  Problem Solving Problem solving assist level: Solves complex problems: Recognizes & self-corrects  Memory Memory assist level: Complete Independence: No helper   Luis [name] 04/25/2016, 8:01 AM

## 2016-04-25 NOTE — Progress Notes (Signed)
Colonel Baldan Angulli, PA notified of low blood pressures.
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
## 2016-04-25 NOTE — Progress Notes (Signed)
Occupational Therapy Session Note  Patient Details  Name: Luis [name] MRN: [id number] Date of Birth: [birth year]  Today's Date: 04/25/2016 OT Individual Time: [id number] OT Individual Time Calculation (min): 54 min    Short Term Goals: Week 1:  OT Short Term Goal 1 (Week 1): Bathe lower body at bed level with setup and mod assist OT Short Term Goal 2 (Week 1): Don pants with mod assist to perform rolls right to left  OT Short Term Goal 3 (Week 1): Groom at sink with setup and supervision OT Short Term Goal 4 (Week 1): Perform SB transfer bed<>w/c with mod assist.   Skilled Therapeutic Interventions/Progress Updates:     Upon entering the room, pt seated in wheelchair with mother present for session. Pt with no c/o pain this session and agreeable to OT intervention. BP taken with results being 107/58 with abdominal binder and compression TEDs donned. Pt propelled wheelchair with B UEs to dayroom with supervision. Pt verbalizing precautions for L UE but unable to recall back precautions when asked. OT reviewed precautions. Pt directing care with mod verbal guidance cues this session for slide board transfer from wheelchair <> mat with +2 assist for safety and verbal cues for head hip relationship. Pt needing max A for sit >supine. Pt coming into long sitting with initial min A needed but progressing to close supervision. Pt needing cues to not utilize L UE to lift LE to don and doff R sock and slipper. Pt needing increased assist with balance when crossing R LE over L knee but progressing back to steady assistance and 1 LOB backwards with mod A to correct. Pt donning and doffing sock with increased time this session. Pt returning to back to wheelchair in same manner as above and returning to room by propelling wheelchair. Call bell and all needed items within reach.   Therapy Documentation Precautions:  Precautions Precautions: Back, Cervical, Fall Precaution Comments: BLE Ace wraps  & SCD's, LUE 90 degrees or less Required Braces or Orthoses: Cervical Brace Cervical Brace: Hard collar, At all times Restrictions Weight Bearing Restrictions: Yes LUE Weight Bearing: Non weight bearing Other Position/Activity Restrictions: due to clavicular fx  General:   Vital Signs: Therapy Vitals Temp: 97.3 F (36.3 C) Temp Source: Oral Pulse Rate: 93 Resp: 18 BP: (!) 89/52 Patient Position (if appropriate): Sitting Oxygen Therapy SpO2: 99 % O2 Device: Not Delivered Pain:   ADL: ADL ADL Comments: see Functional Assessment Exercises:   Other Treatments:    See Function Navigator for Current Functional Status.   Therapy/Group: Individual Therapy  Alen BleacherBradsher, Amandalee Lacap P 04/25/2016, 3:38 PM

## 2016-04-25 NOTE — Procedures (Signed)
Trach removed per MD's order, patient tolerated well. Patient able to vocalize, patient is in no respiratory distress.
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
## 2016-04-25 NOTE — Progress Notes (Addendum)
Physical Therapy Session Note  Patient Details  Name: Luis [name] MRN: [id number] Date of Birth: February 21, 1996  Today's Date: 04/25/2016 PT Individual Time: [id number] and 1551-1630 PT Individual Time Calculation (min): 60 min and 39 min   Skilled Therapeutic Interventions/Progress Updates:    Treatment 1: Pt received in w/c with mother present for session. Pt agreeable to tx, denying c/o pain. Session focused on BLE weight bearing and pt/family education. Pt propelled w/c throughout unit with BUE & supervision in controlled environment. Pt requires cuing for management of brakes and to limit use of LUE. Pt stood in standing frame x 6 minutes with activity focusing on weight bearing through BLE. Pt's vitals as follows:  Sitting: BP = 112/61 mmHg, HR = 94 bpm, SpO2 = 100% Standing after 6 minutes: BP = 84/51 mmHg, HR = 110 bpm, SpO2 = 98% Sitting break for 7 minutes: BP = 101/55 mmHg, HR = 94 bpm Sitting at end of session: BP = 99/56 mmHg, HR = 94 bpm Pt asymptomatic throughout session; pt with ted hose & abdominal binder donned.  Provided pt with handout and demonstration of boosting in w/c, also educating pt & mother on importance of boosting for pressure relief. Pt's mother able to return demonstrate boosting. Provided pt with boosting schedule and reviewed use of it. At end of session pt left sitting in w/c with mother present to supervise. Notified RN of pt's vitals at end of session.    Treatment 2: Pt received in bed with mother present for session. Pt denied c/o pain but reported NT observed bleeding on penis when assisting him with peri-hygiene prior to arrival. Therapist notified RN who f/u with pt. While in nursing care therapist discussed boosting with pt's mother who reports she did not assist him with boosting today because nursing staff assisted him. After RN departure therapist instructed in log rolling technique and provided min assist for RLE placement to assist with  rolling and mod assist for sidelying>sitting EOB. Pt requires increased assistance with transition as he is limited by NWB LUE. Pt completed slide board transfers bed>w/c and w/c<>mat table with max assist & +2 assist for safety. Pt requires max cuing for w/c set up and total assist for slide board placement. Pt able to initiate movement and weight shift across board, however he continues to require max assist for balance, as pt with difficulty maintaining balance with significant anterior weight shift. While sitting on EOM pt participated in reaching outside of BOS and across midline with RUE for challenges to balance; pt required min assist<>supervision for balance with ability to return to midline with supervision. Provided pt with hybrid roho cushion with pt reporting increased comfort. At end of session pt left sitting in w/c in room with mother present to supervise.   Therapy Documentation Precautions:  Precautions Precautions: Back, Cervical, Fall Precaution Comments: BLE Ace wraps & SCD's, LUE 90 degrees or less Required Braces or Orthoses: Cervical Brace Cervical Brace: Hard collar, At all times Restrictions Weight Bearing Restrictions: Yes LUE Weight Bearing: Non weight bearing Other Position/Activity Restrictions: due to clavicular fx     See Function Navigator for Current Functional Status.   Therapy/Group: Individual Therapy  Sandi MariscalVictoria M Aliana Kreischer 04/25/2016, 5:32 PM

## 2016-04-25 NOTE — Progress Notes (Signed)
Occupational Therapy Session Note  Patient Details  Name: Luis [name] MRN: [id number] Date of Birth: 17-Mar-1996  Today's Date: 04/25/2016 OT Individual Time: 8891-6945 OT Individual Time Calculation (min): 40 min    Short Term Goals: Week 1:  OT Short Term Goal 1 (Week 1): Bathe lower body at bed level with setup and mod assist OT Short Term Goal 2 (Week 1): Don pants with mod assist to perform rolls right to left  OT Short Term Goal 3 (Week 1): Groom at sink with setup and supervision OT Short Term Goal 4 (Week 1): Perform SB transfer bed<>w/c with mod assist.  Skilled Therapeutic Interventions/Progress Updates:    1:1 OT session focused on grooming at the sink, postural control/sitting balance, and L UE ROM. Pt completed oral care at the sink with set-up A. RN entered room and administered medications and Respiratory Therapy removed trach. OT assisted with removing c-collar and prep for removing trach. Pt then brought to gym and Dynavision used seated in wc. Focus on L UE ROM to protocol (AROM limited to 90), and dynamic sitting balance when reaching outside base of support. Pt unclear of what 90 degrees looks like, so OT demonstrated. Pt with a few lateral LOB when reaching requiring Min A + back support of w/c to correct. Rest breaks in between 60 second intervals x4. Pt returned to room at end of session and left with needs met.   Therapy Documentation Precautions:  Precautions Precautions: Back, Cervical, Fall Precaution Comments: BLE Ace wraps & SCD's, LUE 90 degrees or less Required Braces or Orthoses: Cervical Brace Cervical Brace: Hard collar, At all times Restrictions Weight Bearing Restrictions: Yes LUE Weight Bearing: Non weight bearing Other Position/Activity Restrictions: due to clavicular fx  Pain: Pain Assessment Pain Assessment: No/denies pain ADL: ADL ADL Comments: see Functional Assessment  See Function Navigator for Current Functional  Status.   Therapy/Group: Individual Therapy  Valma Cava 04/25/2016, 10:17 AM

## 2016-04-25 NOTE — Progress Notes (Signed)
Occupational Therapy Session Note  Patient Details  Name: [name] MRN: [id number] Date of Birth: Apr 21, 1995  Today's Date: 04/25/2016 OT Individual Time: 0900-1000 OT Individual Time Calculation (min): 60 min    Short Term Goals: Week 1:  OT Short Term Goal 1 (Week 1): Bathe lower body at bed level with setup and mod assist OT Short Term Goal 2 (Week 1): Don pants with mod assist to perform rolls right to left  OT Short Term Goal 3 (Week 1): Groom at sink with setup and supervision OT Short Term Goal 4 (Week 1): Perform SB transfer bed<>w/c with mod assist.  Skilled Therapeutic Interventions/Progress Updates:    Pt engaged in BADL retraining including bathing/dressing at bed level.  Pt able to sit in long sitting unsupported to bathe BLE and feet.  Pt also able to initiate threading pants while in long sitting.  Pt completed UB bathing/dressing tasks without assistance.  Pt required assistance to pull pants over hips.  Pt performed SB transfer to w/c with tot A + 2 for safety.  Pt remained in w/c with all needs within reach and Mom present.   Therapy Documentation Precautions:  Precautions Precautions: Back, Cervical, Fall Precaution Comments: BLE Ace wraps & SCD's, LUE 90 degrees or less Required Braces or Orthoses: Cervical Brace Cervical Brace: Hard collar, At all times Restrictions Weight Bearing Restrictions: Yes LUE Weight Bearing: Non weight bearing Other Position/Activity Restrictions: due to clavicular fx  Pain: Pain Assessment Pain Assessment: No/denies pain  See Function Navigator for Current Functional Status.   Therapy/Group: Individual Therapy  Rich BraveLanier, Roosevelt Bisher Chappell 04/25/2016, 10:25 AM

## 2016-04-26 ENCOUNTER — Inpatient Hospital Stay (HOSPITAL_COMMUNITY): Payer: Medicaid Other | Admitting: Occupational Therapy

## 2016-04-26 ENCOUNTER — Inpatient Hospital Stay (HOSPITAL_COMMUNITY): Payer: Self-pay | Admitting: *Deleted

## 2016-04-26 ENCOUNTER — Inpatient Hospital Stay (HOSPITAL_COMMUNITY): Payer: Medicaid Other

## 2016-04-26 ENCOUNTER — Inpatient Hospital Stay (HOSPITAL_COMMUNITY): Payer: Self-pay | Admitting: Occupational Therapy

## 2016-04-26 ENCOUNTER — Inpatient Hospital Stay (HOSPITAL_COMMUNITY): Payer: Self-pay

## 2016-04-26 NOTE — Plan of Care (Signed)
Problem: RH Wheelchair Mobility Goal: LTG Patient will propel w/c in controlled environment (PT) LTG: Patient will propel wheelchair in controlled environment, # of feet with assist (PT)  150 ft; upgrade 2/2 progress Goal: LTG Patient will propel w/c in home environment (PT) LTG: Patient will propel wheelchair in home environment, # of feet with assistance (PT).  50 ft; upgrade 2/2 progress Goal: LTG Patient will propel w/c in community environment (PT) LTG: Patient will propel wheelchair in community environment, # of feet with assist (PT)  150 ft; upgrade 2/2 progress

## 2016-04-26 NOTE — Progress Notes (Signed)
Occupational Therapy Session Note  Patient Details  Name: Luis [name] MRN: [id number] Date of Birth: 1996/01/12  Today's Date: 04/26/2016 OT Individual Time: 0900-1000 OT Individual Time Calculation (min): 60 min    Short Term Goals: Week 1:  OT Short Term Goal 1 (Week 1): Bathe lower body at bed level with setup and mod assist OT Short Term Goal 2 (Week 1): Don pants with mod assist to perform rolls right to left  OT Short Term Goal 3 (Week 1): Groom at sink with setup and supervision OT Short Term Goal 4 (Week 1): Perform SB transfer bed<>w/c with mod assist.  Skilled Therapeutic Interventions/Progress Updates:    Pt resting in w/c upon arrival.  Pt completed UB bathing/dressing tasks while seated in w/c at sink.  Pt completed tasks at supervision level.  Pt performed sliding board transfer (tot A +2) to bed to bathe buttocks and periarea.  Pt stated he put on clean pants earlier.  Pt incontinent of bowel and required tot A for hygiene.  Pt's sacral bandage soiled and replaced.  Pt able to roll in bed with assist for BLE management.  Pt assisted with doffing pants and pulling up pants while supine in bed.  Pt returned to w/c awaiting next therapy.  Focus on bed mobility, functional transfers, w/c setup, BADL retraining, and safety awareness to increase independence with BADLs.   Therapy Documentation Precautions:  Precautions Precautions: Back, Cervical, Fall Precaution Comments: BLE Ace wraps & SCD's, LUE 90 degrees or less Required Braces or Orthoses: Cervical Brace Cervical Brace: Hard collar, At all times Restrictions Weight Bearing Restrictions: Yes LUE Weight Bearing: Non weight bearing Other Position/Activity Restrictions: due to clavicular fx  Pain: Pt c/o discomfort along lower medial border of L scapula; kinesio tape applied See Function Navigator for Current Functional Status.   Therapy/Group: Individual Therapy  Rich BraveLanier, Jaylin Benzel Chappell 04/26/2016, 2:15  PM
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
## 2016-04-26 NOTE — Progress Notes (Signed)
Occupational Therapy Note  Patient Details  Name: Luis [name] MRN: [id number] Date of Birth: October 11, 1995  Today's Date: 04/26/2016 OT Individual Time: 1330-1400 OT Individual Time Calculation (min): 30 min   Pt denied pain Individual Therapy  Pt engaged in bed mobility and functional transfers.  Pt requires max A for supine>sit EOB (to pt's R).  Pt unable to bear weight through LUE.  Pt sat EOB with steady A in preparation for sliding board transfer with tot A +2.  Pt is able to reach with RUE and pull across sliding board.    Lavone NeriLanier, Danijela Vessey [facility]ell 04/26/2016, 2:10 PM

## 2016-04-26 NOTE — Progress Notes (Addendum)
Attempted to remove foley, removed 10ml from foley, unable to remove foley.  Had second nurse assist with removal.  Some tissue attached to end of foley.  Pt tolerated well.

## 2016-04-26 NOTE — Progress Notes (Signed)
Educated pt on I/o caths and bowel program, explained importance of managing both so that body can be trained when to hv bowel movement which would be the goal.  Explained that may take a while for body to adjust and will schedule bladder emptying if not able to empty.

## 2016-04-26 NOTE — Progress Notes (Signed)
Occupational Therapy Session Note  Patient Details  Name: Luis [name] MRN: [id number] Date of Birth: 11/22/1995  Today's Date: 04/26/2016 OT Individual Time: 1500-1530 OT Individual Time Calculation (min): 30 min    Short Term Goals: Week 1:  OT Short Term Goal 1 (Week 1): Bathe lower body at bed level with setup and mod assist OT Short Term Goal 2 (Week 1): Don pants with mod assist to perform rolls right to left  OT Short Term Goal 3 (Week 1): Groom at sink with setup and supervision OT Short Term Goal 4 (Week 1): Perform SB transfer bed<>w/c with mod assist.  Skilled Therapeutic Interventions/Progress Updates:    Pt greeted in wc in room and agreeable to OT treatment with focus on B UE coordination w/ w/c mobility, dynamic sitting balance/postural control. Discussed pressure relief positioning, but pt limited 2/2 L UE NWB- discussed ways family could assist with pressure relief.  Pt propelled wc to [employer] with Min a to maneuver out of room. Dynavision used for dynamic reaching task within 90 degree L UE ROM restriction.  Armrests removed from wc for increased balance challenge with focus on trunk control with lateral leans when reaching outside base of support to L and R. Pt returned to room at end of session and left seated in wc with needs met.   Therapy Documentation Precautions:  Precautions Precautions: Back, Cervical, Fall Precaution Comments: BLE Ace wraps & SCD's, LUE 90 degrees or less Required Braces or Orthoses: Cervical Brace Cervical Brace: Hard collar, At all times Restrictions Weight Bearing Restrictions: Yes LUE Weight Bearing: Non weight bearing Other Position/Activity Restrictions: due to clavicular fx  Pain: Pain Assessment Pain Assessment: No/denies pain ADL: ADL ADL Comments: see Functional Assessment  See Function Navigator for Current Functional Status.   Therapy/Group: Individual Therapy  Valma Cava 04/26/2016, 3:33 PM

## 2016-04-26 NOTE — Progress Notes (Signed)
Occupational Therapy Session Note  Patient Details  Name: Luis [name] MRN: [id number] Date of Birth: [birth year]  Today's Date: 04/26/2016 OT Individual Time: 6962-95281032-1157 OT Individual Time Calculation (min): 85 min    Short Term Goals: Week 1:  OT Short Term Goal 1 (Week 1): Bathe lower body at bed level with setup and mod assist OT Short Term Goal 2 (Week 1): Don pants with mod assist to perform rolls right to left  OT Short Term Goal 3 (Week 1): Groom at sink with setup and supervision OT Short Term Goal 4 (Week 1): Perform SB transfer bed<>w/c with mod assist.  Skilled Therapeutic Interventions/Progress Updates:    Treatment session with focus on static and dynamic sitting balance, adherence to LUE NWB, and transfers.  Pt received upright in w/c with no c/o pain.  Pt propelled w/c to Dayroom with supervision, pt reports decreased stability in new w/c but "getting used to it".  Completed slide board transfer to Rt with max- total assist and 2nd person present for safety.  Engaged in checkers activity while seated at edge of mat with focus on static and dynamic sitting balance.  Supervision for static sitting balance throughout session, requiring mod assist when reaching across midline with RUE as pt would demonstrate increased Lt trunk shortening and "collapse" to Lt.  Multimodal cues provided to promote trunk elongation as able during activity to improve stability with reaching.  Pt tolerated >60 mins seated at edge of mat with one rest break. Monitored BP during session with no reports of light headedness or dizziness.  Mod cues for LUE NWB during dynamic sitting balance, incorporated LUE in moderation with reaching within close range to body.  Completed transfers to Lt mat > w/c > bed with total assist and +2 for transfer in to bed due to positioning.  Left semi-reclined in bed with all needs in reach.  Sister present throughout session.  Therapy Documentation Precautions:   Precautions Precautions: Back, Cervical, Fall Precaution Comments: BLE Ace wraps & SCD's, LUE 90 degrees or less Required Braces or Orthoses: Cervical Brace Cervical Brace: Hard collar, At all times Restrictions Weight Bearing Restrictions: Yes LUE Weight Bearing: Non weight bearing Other Position/Activity Restrictions: due to clavicular fx  General:   Vital Signs: Therapy Vitals BP: (!) 101/56 Patient Position (if appropriate): Sitting Pain: Pain Assessment Pain Assessment: No/denies pain  See Function Navigator for Current Functional Status.   Therapy/Group: Individual Therapy  Rosalio LoudHOXIE, Srinivas Lippman 04/26/2016, 12:16 PM

## 2016-04-26 NOTE — Progress Notes (Signed)
Physical Therapy Session Note  Patient Details  Name: Luis [name] MRN: [id number] Date of Birth: [birth year]  Today's Date: 04/26/2016 PT Individual Time: 0805-0900 PT Individual Time Calculation (min): 55 min   Short Term Goals: Week 1:  PT Short Term Goal 1 (Week 1): Pt will perform slide board transfers with mod assist +1. PT Short Term Goal 2 (Week 1): Pt will demonstrate static sitting balance with min assist. PT Short Term Goal 3 (Week 1): Pt will propel w/c 75 ft with min assist.  Skilled Therapeutic Interventions/Progress Updates:    Pt received in bed & agreeable to tx. Pt without c/o pain during session. Session focused on pt education and w/c mobility. Provided pt with ultra lightweight w/c, adjusted leg rest, and educated pt on w/c parts management. Pt transferred bed>w/c with slide board & max assist & +2 for safety. Pt with improving ability to weight shift arcoss board but continues to require max assist for balance and cuing for NWB LUE. Pt propelled w/c throughout unit with supervision to focus on new fit of w/c. Educated pt & father on boosting in ultra lightweight w/c with father return demonstrating & pt voicing understanding of boosting regimen and use of checklist. At end of session pt left sitting in w/c in room with all needs within reach.   Therapy Documentation Precautions:  Precautions Precautions: Back, Cervical, Fall Precaution Comments: BLE Ace wraps & SCD's, LUE 90 degrees or less Required Braces or Orthoses: Cervical Brace Cervical Brace: Hard collar, At all times Restrictions Weight Bearing Restrictions: Yes LUE Weight Bearing: Non weight bearing Other Position/Activity Restrictions: due to clavicular fx    See Function Navigator for Current Functional Status.   Therapy/Group: Individual Therapy  Sandi MariscalVictoria M Shenouda [name] 04/26/2016, 9:33 AM

## 2016-04-27 ENCOUNTER — Inpatient Hospital Stay (HOSPITAL_COMMUNITY): Payer: Medicaid Other

## 2016-04-27 ENCOUNTER — Inpatient Hospital Stay (HOSPITAL_COMMUNITY): Payer: Medicaid Other | Admitting: Physical Therapy

## 2016-04-27 ENCOUNTER — Inpatient Hospital Stay (HOSPITAL_COMMUNITY): Payer: Medicaid Other | Admitting: Occupational Therapy

## 2016-04-27 ENCOUNTER — Inpatient Hospital Stay (HOSPITAL_COMMUNITY): Payer: Self-pay | Admitting: *Deleted

## 2016-04-27 ENCOUNTER — Inpatient Hospital Stay (HOSPITAL_COMMUNITY): Payer: Self-pay | Admitting: Physical Therapy

## 2016-04-27 ENCOUNTER — Inpatient Hospital Stay (HOSPITAL_COMMUNITY): Payer: Self-pay | Admitting: Occupational Therapy

## 2016-04-27 MED ORDER — BISACODYL 10 MG RE SUPP
10.0000 mg | Freq: Every day | RECTAL | Status: DC
  Administered 2016-04-28 – 2016-05-13 (×16): 10 mg via RECTAL
  Filled 2016-04-27 (×16): qty 1

## 2016-04-27 MED ORDER — SENNA 8.6 MG PO TABS
2.0000 | ORAL_TABLET | ORAL | Status: DC
  Administered 2016-04-27 – 2016-05-01 (×4): 17.2 mg via ORAL
  Filled 2016-04-27 (×4): qty 2

## 2016-04-27 NOTE — Progress Notes (Addendum)
Occupational Therapy Note  Patient Details  Name: Luis [name] MRN: [id number] Date of Birth: 10/20/1995  Today's Date: 04/27/2016 OT Missed Time: 60 Minutes Missed Time Reason: X-Ray  Pt missed 60 mins skilled OT services.  Off unit for x-ray   Rich BraveLanier, Jenniferann Stuckert Chappell 04/27/2016, 10:30 AM

## 2016-04-27 NOTE — Progress Notes (Signed)
Physical Therapy Session Note  Patient Details  Name: Luis [name] MRN: [id number] Date of Birth: 1996/02/27  Today's Date: 04/27/2016 PT Individual Time: 0840-0908 PT Individual Time Calculation (min): 28 min   Short Term Goals: Week 2:  PT Short Term Goal 1 (Week 2): Pt will perform functional transfers consistently with mod A PT Short Term Goal 2 (Week 2): Pt will perform w/c mobility with supervision 150' PT Short Term Goal 3 (Week 2): pt will perform bed mobility with mod A  Skilled Therapeutic Interventions/Progress Updates: Pt presented in w/c agreeable to therapy. Propelled to rehab gym with supervision. Performed SB transfer to mat with PTA requesting teachback for SB placement and sequencing. Performed SB transfer with modA x1. Performed seated unssuuported static and dynamic balance activities. Including reaching high/low/across midline. Lateral and a-p leans for core strengthening. Pt returned to w/c via SB transfer with modAx2. Pt returned to room via w/c and left with family present and all current needs met.      Therapy Documentation Precautions:  Precautions Precautions: Back, Cervical, Fall Precaution Comments: BLE Ace wraps & SCD's, LUE 90 degrees or less Required Braces or Orthoses: Cervical Brace Cervical Brace: Hard collar, At all times Restrictions Weight Bearing Restrictions: Yes LUE Weight Bearing: Non weight bearing Other Position/Activity Restrictions: due to clavicular fx  General:   Vital Signs: Therapy Vitals Pulse Rate: 93 BP: 124/68    Other Treatments:     See Function Navigator for Current Functional Status.   Therapy/Group: Individual Therapy  Yousra Ivens  Brave Dack, PTA  04/27/2016, 12:21 PM

## 2016-04-27 NOTE — Progress Notes (Signed)
Physical Therapy Note  Patient Details  Name: Luis [name] MRN: [id number] Date of Birth: 1995-07-01 Today's Date: 04/27/2016    Time: 730-757 27 minutes  1:1 No c/o pain. Pt performed bed mobility with max A for trunk and LEs.  Pt performed sliding board transfer to w/c with max A with use of Rt UE to pull.  W/c mobility through obstacle course multiple reps fwd and bkwd with pt with good maneuvering and control.  Pt's father given home measurement sheet to complete.   Jadea Shiffer 04/27/2016, 7:57 AM

## 2016-04-27 NOTE — Progress Notes (Signed)
Occupational Therapy Session Note  Patient Details  Name: Luis [name] MRN: [id number] Date of Birth: 11-25-1995  Today's Date: 04/27/2016 OT Individual Time: 1130-1200 OT Individual Time Calculation (min): 30 min    Short Term Goals: Week 1:  OT Short Term Goal 1 (Week 1): Bathe lower body at bed level with setup and mod assist OT Short Term Goal 2 (Week 1): Don pants with mod assist to perform rolls right to left  OT Short Term Goal 3 (Week 1): Groom at sink with setup and supervision OT Short Term Goal 4 (Week 1): Perform SB transfer bed<>w/c with mod assist.  Skilled Therapeutic Interventions/Progress Updates:    Treatment session with focus on LB bathing and dressing.  Therapist assisted with doffing pants over buttocks, after pt attempting to bridge and roll with decreased success in adjusting pants.  Pt able to achieve circle sitting to doff pants, wash, and then don pants and socks with increased time.  Pt utilizing bed features to allow upper body support while completing LB bathing and dressing.  Pt required assistance to pull pants over hips with rolling at bed level.  Engaged in discussion regarding recovery process and impaired sensation in BLE in context within self-care tasks and mobility.  Therapy Documentation Precautions:  Precautions Precautions: Back, Cervical, Fall Precaution Comments: BLE Ace wraps & SCD's, LUE 90 degrees or less Required Braces or Orthoses: Cervical Brace Cervical Brace: Hard collar, At all times Restrictions Weight Bearing Restrictions: Yes LUE Weight Bearing: Non weight bearing Other Position/Activity Restrictions: due to clavicular fx  General: General OT Amount of Missed Time: 60 Minutes Vital Signs: Therapy Vitals Pulse Rate: 93 BP: 124/68 Pain:  Pt with no c/o pain  See Function Navigator for Current Functional Status.   Therapy/Group: Individual Therapy  Rosalio LoudHOXIE, Thorne Wirz 04/27/2016, 12:14 PM

## 2016-04-27 NOTE — Progress Notes (Signed)
Physical Therapy Session Note  Patient Details  Name: Luis [name] MRN: [id number] Date of Birth: June 30, 1995  Today's Date: 04/27/2016 PT Individual Time: 1550-1630 PT Individual Time Calculation (min): 40 min   Short Term Goals: Week 1:  PT Short Term Goal 1 (Week 1): Pt will perform slide board transfers with mod assist +1. PT Short Term Goal 1 - Progress (Week 1): Progressing toward goal PT Short Term Goal 2 (Week 1): Pt will demonstrate static sitting balance with min assist. PT Short Term Goal 2 - Progress (Week 1): Met PT Short Term Goal 3 (Week 1): Pt will propel w/c 75 ft with min assist. PT Short Term Goal 3 - Progress (Week 1): Met  Skilled Therapeutic Interventions/Progress Updates:   Pt received sitting in WC and agreeable to PT  WC mobility 118f and 2071fwithout cues or assist from PT. Max assist for proper positioning in WC. To all proper Weight distribution on pressure relieving cushion  SB transfer to bed with max assist from PT. Max cues for proper trunk positioning to improve head hips relationship. Patient continues to be unable to bear weight through LUE due to pain in scapular region.   Sitting EOB with supervision assist from PT with min cues for proper trunk positioning to prevent posterior LOB.   Lateral scoot on bed with max assist from PT with cues for UE placement and proper LE positioning.   Sitting to Semirecumbent with total assist from PT.   Semirecumbent to long sitting with total assist from PT at trunk due to inability to use LUE to push to sitting position. Long sitting to stretch HS and work on sitting balance 4 bouts x 2 minutes with supervision assist from   Patient returned too [facility] and [facility] and all needs met.              Therapy Documentation Precautions:  Precautions Precautions: Back, Cervical, Fall Precaution Comments: BLE Ace wraps & SCD's, LUE 90 degrees or less Required  Braces or Orthoses: Cervical Brace Cervical Brace: Hard collar, At all times Restrictions Weight Bearing Restrictions: Yes LUE Weight Bearing: Non weight bearing Other Position/Activity Restrictions: due to clavicular fx  General:   Vital Signs: Therapy Vitals Temp: 97.8 F (36.6 C) Temp Source: Oral Pulse Rate: 92 Resp: 16 BP: 107/64 Patient Position (if appropriate): Lying Oxygen Therapy SpO2: 100 % O2 Device: Not Delivered Pain: 4/10 in L shoulder  See Function Navigator for Current Functional Status.   Therapy/Group: Individual Therapy  AuLorie Phenix/31/2018, 4:30 PM

## 2016-04-27 NOTE — Progress Notes (Signed)
Physical Therapy Weekly Progress Note  Patient Details  Name: Luis [name] MRN: [id number] Date of Birth: 10/23/1995  Beginning of progress report period: April 21, 2016 End of progress report period: April 27, 2016  Patient has met 2 of 3 short term goals.  Pt improving ability to propel w/c and participate in transfers. Continues to require max- total A for transfers due to decreased balance and Lt UE NWB status. Pt improving sitting balance in static positions.  Pt's family continues to be involved and has been educated on pressure relief in w/c.  Patient continues to demonstrate the following deficits muscle weakness, abnormal tone and decreased sitting balance, decreased postural control and decreased balance strategies and therefore will continue to benefit from skilled PT intervention to increase functional independence with mobility.  Patient progressing toward long term goals..  Continue plan of care.  PT Short Term Goals Week 1:  PT Short Term Goal 1 (Week 1): Pt will perform slide board transfers with mod assist +1. PT Short Term Goal 1 - Progress (Week 1): Progressing toward goal PT Short Term Goal 2 (Week 1): Pt will demonstrate static sitting balance with min assist. PT Short Term Goal 2 - Progress (Week 1): Met PT Short Term Goal 3 (Week 1): Pt will propel w/c 75 ft with min assist. PT Short Term Goal 3 - Progress (Week 1): Met Week 2:  PT Short Term Goal 1 (Week 2): Pt will perform functional transfers consistently with mod A PT Short Term Goal 2 (Week 2): Pt will perform w/c mobility with supervision 150' PT Short Term Goal 3 (Week 2): pt will perform bed mobility with mod A  Skilled Therapeutic Interventions/Progress Updates:  Discharge planning;DME/adaptive equipment instruction;Functional mobility training;Pain management;Psychosocial support;Splinting/orthotics;Therapeutic Activities;UE/LE Strength taining/ROM;Wheelchair propulsion/positioning;UE/LE  Coordination activities;Therapeutic Exercise;Stair training;Skin care/wound management;Patient/family education;Neuromuscular re-education;Disease management/prevention;Community reintegration;Balance/vestibular training     See Function Navigator for Current Functional Status.  Cypher Paule 04/27/2016, 7:20 AM
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
## 2016-04-27 NOTE — Progress Notes (Signed)
Occupational Therapy Note  Patient Details  Name: Luis [name] MRN: [id number] Date of Birth: 02/14/1996  Today's Date: 04/27/2016 OT Individual Time: 1330-1400 OT Individual Time Calculation (min): 30 min   Pt denied pain Individual therapy  Pt resting in bed upon arrival.  Pt stated he bathed his legs earlier in the day but he thinks he had a BM.  Pt able to pull pants down to knees. Pt rolls in bed requiring assistance only for BLE management/positioning.  Pt is able to reach to buttocks but unable to perform hygiene at this time.  Pt pulled pants over hips without assistance except for BLE positioning when rolling.  Pt transferred to w/c (sliding board) mod A + 2 for safety.  Pt remained in w/c with sister present.    Lavone NeriLanier, Dennis Killilea [facility] Human [facility] InstituteChappell 04/27/2016, 2:42 PM

## 2016-04-28 ENCOUNTER — Inpatient Hospital Stay (HOSPITAL_COMMUNITY): Payer: Self-pay | Admitting: Physical Therapy

## 2016-04-28 ENCOUNTER — Inpatient Hospital Stay (HOSPITAL_COMMUNITY): Payer: Self-pay

## 2016-04-28 ENCOUNTER — Inpatient Hospital Stay (HOSPITAL_COMMUNITY): Payer: Medicaid Other | Admitting: Physical Therapy

## 2016-04-28 ENCOUNTER — Inpatient Hospital Stay (HOSPITAL_COMMUNITY): Payer: Medicaid Other | Admitting: Occupational Therapy

## 2016-04-28 ENCOUNTER — Inpatient Hospital Stay (HOSPITAL_COMMUNITY): Payer: Self-pay | Admitting: Occupational Therapy

## 2016-04-28 MED ORDER — ACETAMINOPHEN 160 MG/5ML PO SOLN
650.0000 mg | Freq: Four times a day (QID) | ORAL | Status: DC | PRN
  Administered 2016-05-12: 650 mg via ORAL

## 2016-04-28 MED ORDER — TRAMADOL HCL 50 MG PO TABS
100.0000 mg | ORAL_TABLET | Freq: Four times a day (QID) | ORAL | Status: DC
  Administered 2016-04-29 – 2016-05-13 (×57): 100 mg via ORAL
  Filled 2016-04-28 (×58): qty 2
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
## 2016-04-28 NOTE — Progress Notes (Signed)
Occupational Therapy Session Note  Patient Details  Name: Fedor Kazmierski MRN: [id number] Date of Birth: 1996-01-23  Today's Date: 04/28/2016 OT Individual Time: 1120-1200 OT Individual Time Calculation (min): 40 min    Short Term Goals: Week 1:  OT Short Term Goal 1 (Week 1): Bathe lower body at bed level with setup and mod assist OT Short Term Goal 1 - Progress (Week 1): Met OT Short Term Goal 2 (Week 1): Don pants with mod assist to perform rolls right to left  OT Short Term Goal 2 - Progress (Week 1): Met OT Short Term Goal 3 (Week 1): Groom at sink with setup and supervision OT Short Term Goal 3 - Progress (Week 1): Met OT Short Term Goal 4 (Week 1): Perform SB transfer bed<>w/c with mod assist. OT Short Term Goal 4 - Progress (Week 1): Progressing toward goal Week 2:  OT Short Term Goal 1 (Week 2): Perform SB transfer bed<>w/c with mod assist. OT Short Term Goal 2 (Week 2): Pt will perform LB dressing with min A at bed level OT Short Term Goal 3 (Week 2): Pt will perform supine>sit EOB with max A OT Short Term Goal 4 (Week 2): Pt will perform drop arm BSC transfers with max A      Skilled Therapeutic Interventions/Progress Updates:    Pt seen this session to facilitate bed mobility, sitting balance,  LUE wt bearing tolerance and LUE AROM. Pt received in bed with his sister Maudry Mayhew in the room.  Initially worked on LUE AROM from supine. In this position, pt has full sh flexion AROM.  Worked on rolling onto L side and using LUE to push up into sitting as he is now WBAT on arm. Pt tolerated well.  In sitting, practiced static sit (S), scapular depression to reduce kyphosis which limits UE sh ROM, lateral leans with hand to elbow pushing up with close S. With mod A for support worked on reaching out of base of support without opposite arm as a stabilizer. Lateral scoots on bed with BUE pushing with mod A of 2. (sister assisted)  Pt was able to move from sit to sidelying to supine with  mod A. Excellent participation and tolerance to activity.  Pt resting in bed with all needs met.  Therapy Documentation Precautions:  Precautions Precautions: Back, Cervical, Fall Precaution Comments: BLE Ace wraps & SCD's, LUE 90 degrees or less Required Braces or Orthoses: Cervical Brace Cervical Brace: Hard collar, At all times Restrictions Weight Bearing Restrictions: Yes LUE Weight Bearing: Non weight bearing Other Position/Activity Restrictions: due to clavicular fx     Vital Signs: Therapy Vitals Pulse Rate: (!) 103 BP: 120/76 Patient Position (if appropriate): Lying Pain: no c/o pain   ADL: ADL ADL Comments: see Functional Assessment  See Function Navigator for Current Functional Status.   Therapy/Group: Individual Therapy  [place] 04/28/2016, 9:43 AM

## 2016-04-28 NOTE — Progress Notes (Signed)
Occupational Therapy Note  Patient Details  Name: Luis [name] MRN: [id number] Date of Birth: April 26, 1995  Today's Date: 04/28/2016 OT Individual Time: 1345-1430 OT Individual Time Calculation (min): 45 min   Pt denied pain Individual therapy  Pt sitting in w/c upon arrival.  Pt performed SBT to therapy mat (mod A).  Pt engaged in static and dynamic sitting tasks including reaching outside BOS in all planes with min A.  Pt increased sitting balance and controlled movements throughout session and required only min a for tasks.  Pt transferred back to w/c with min A for SBT.  Pt performed SBT with min A back to bed for RN.  Pt able to instruct therapist on placement of sliding board and set up of w/c.     Lavone NeriLanier, Burrell Hodapp [facility] 04/28/2016, 2:39 PM

## 2016-04-28 NOTE — Progress Notes (Signed)
Social Work Patient ID: Luis [name], male   DOB: 03/13/1996, 21 y.o.   MRN: [id number]   Met with pt and sister to review team conference.  They are aware and agreeable with targeted d/c date of 2/16.  Pleased he has now been allowed to WB through UE.  Both deny any concerns or questions at this point.  Plan to have neuropsychology consult with pt next week.    [name], LCSW

## 2016-04-28 NOTE — Progress Notes (Signed)
Occupational Therapy Session Note  Patient Details  Name: Luis [name] MRN: [id number] Date of Birth: [birth year]  Today's Date: 04/28/2016 OT Individual Time: 0900-1000 OT Individual Time Calculation (min): 60 min    Short Term Goals: Week 2:  OT Short Term Goal 1 (Week 2): Perform SB transfer bed<>w/c with mod assist. OT Short Term Goal 2 (Week 2): Pt will perform LB dressing with min A at bed level OT Short Term Goal 3 (Week 2): Pt will perform supine>sit EOB with max A OT Short Term Goal 4 (Week 2): Pt will perform drop arm BSC transfers with max A  Skilled Therapeutic Interventions/Progress Updates:    Pt resting in bed upon arrival.  Pt engaged in BADLs including bathing/dressing at bed level.  Pt able to sit in long sitting and manage BLE to bathe feet and thread pants.  Pt able to roll in bed to reach buttocks and pull up pants.  Pt requires assistance with cleaning buttocks to assure thoroughness.  Ongoing education on correct technique for bed mobility/rolling in bed.  Pt able to sit unsupported in bed to don shirt and bathe UB/LB. Pt performed SBT to w/c with mod A and engaged in grooming tasks at sink.  Focus on continued BADL retraining, sitting balance, functional transfers, and safety awareness. Pt c/o nausea at end of session. RN notified.  Therapy Documentation Precautions:  Precautions Precautions: Back, Cervical, Fall Precaution Comments: BLE Ace wraps & SCD's, LUE 90 degrees or less Required Braces or Orthoses: Cervical Brace Cervical Brace: Hard collar, At all times Restrictions Weight Bearing Restrictions: Yes LUE Weight Bearing:WBAT Other Position/Activity Restrictions: due to clavicular fx   Pain: Pt denied pain    See Function Navigator for Current Functional Status.   Therapy/Group: Individual Therapy  Rich BraveLanier, Aarik Blank Ch[facility]ell 04/28/2016, 12:20 PM
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
## 2016-04-28 NOTE — Consult Note (Addendum)
WOC Nurse wound follow-up consult note Stage 3 pressure injury re-assessed, has decreased slightly in size since previous assessment. Pt is very thin with protruding sacrum bone over the location, discussed the plan of care with him. 1.8X2X.1cm, 95% red and moist. 5% yellow.  Foam dressing has been applied to protect and promote healing, pt has air mattress to decrease pressure. Continue present plan of care and discussed importance of offloading to the affected area frequently when OOB to wheelchair. Pressure Injury POA: No Cammie Mcgeeawn Alexina Niccoli MSN, RN, CWOCN, [place], CNS [phone]

## 2016-04-28 NOTE — Progress Notes (Signed)
Physical Therapy Session Note  Patient Details  Name: Luis [name] MRN: [id number] Date of Birth: 1995-11-09  Today's Date: 04/28/2016 PT Individual Time: 1545-1630 PT Individual Time Calculation (min): 45 min   Short Term Goals: Week 2:  PT Short Term Goal 1 (Week 2): Pt will perform functional transfers consistently with mod A PT Short Term Goal 2 (Week 2): Pt will perform w/c mobility with supervision 150' PT Short Term Goal 3 (Week 2): pt will perform bed mobility with mod A  Skilled Therapeutic Interventions/Progress Updates:      Pt received supine in bed and agreeable to PT. Supine>sit transfer with max assist from PT max cues for UE placement to improved use of BLE to come to sitting position. SB transfer to [facility] with max assist with +2 assist to stabilize the SB and moderate cues for proper placement of BLE to improved ability to use LUE.   WC mobility through hospital hall way 4 bouts up to 550f with supervision assist from PT and min cues on change in elevation to prevent posterior LOB.   Standing frame 3 bouts up to 2 minutes. Patient able to utilize BUE to stabilize trunk as position pelvis with UE support to improve equal WB through BLE. Patient remain asymptomatic throughout bouts of standing endurance.   SB transfer back to Bed with max assist with mod-max cues for trunk positioning and UE placement to improve success and safety of transfer.   Patient returned to room and left supine in bed with call bell in reach and all needs met.     Therapy Documentation Precautions:  Precautions Precautions: Fall, Back, Cervical Precaution Comments: BLE Ace wraps & SCD's, LUE 90 degrees or less Required Braces or Orthoses: Cervical Brace Cervical Brace: Hard collar, At all times Restrictions Weight Bearing Restrictions: Yes LUE Weight Bearing: Weight bearing as tolerated Other Position/Activity Restrictions: no shoulder ROM above 90 degrees General:   Vital  Signs: Therapy Vitals Temp: 97.8 F (36.6 C) Temp Source: Oral Pulse Rate: 92 Resp: 17 BP: 112/75 Patient Position (if appropriate): Lying Oxygen Therapy SpO2: 100 % O2 Device: Not Delivered Pain: Pain Assessment Pain Assessment: No/denies pain Pain Score: 0-No pain   See Function Navigator for Current Functional Status.   Therapy/Group: Individual Therapy  ALorie Phenix2/03/2016, 5:52 PM

## 2016-04-28 NOTE — Progress Notes (Signed)
Physical Therapy Session Note  Patient Details  Name: Luis [name] MRN: [id number] Date of Birth: 17-Sep-1995  Today's Date: 04/28/2016 PT Individual Time: [id number] PT Individual Time Calculation (min): 40 min   Short Term Goals: Week 2:  PT Short Term Goal 1 (Week 2): Pt will perform functional transfers consistently with mod A PT Short Term Goal 2 (Week 2): Pt will perform w/c mobility with supervision 150' PT Short Term Goal 3 (Week 2): pt will perform bed mobility with mod A  Skilled Therapeutic Interventions/Progress Updates:    Pt received in bed & agreeable to tx, denying c/o pain. Pt's sister [name](Luz) present for session. Pt noted feeling "blue" today but chose not to elaborate on this; therapist provided encouragement throughout session. Educated pt on updated weight bearing status (pt now WBAT LUE but still no shoulder ROM >90 degrees). Pt continues to require cuing to maintain back precautions during functional mobility. Provided min/mod assist for technique & balance while log rolling and sidelying>sitting EOB but pt able to push to upright sitting with LUE without c/o pain. Pt completed slide board transfer bed>w/c and w/c<>car (equal height) with mod assist for balance and cuing for head/hips relationship. Pt requires total assist for slide board placement but has begun to assist with slide board removal after transfers. Pt continues to require cuing for w/c set up and parts management during all transfers. Pt propelled down to and around gift shop with supervision with occasional cuing to maneuver w/c in small spaces. Pt required 1 rest break 2/2 fatigue. At end of session pt left awaiting OT with sister present.   Therapy Documentation Precautions:  Precautions Precautions: Fall, Back, Cervical Precaution Comments: BLE Ace wraps & SCD's, LUE 90 degrees or less Required Braces or Orthoses: Cervical Brace Cervical Brace: Hard collar, At all times Restrictions Weight  Bearing Restrictions: Yes LUE Weight Bearing: Weight bearing as tolerated Other Position/Activity Restrictions: no shoulder ROM above 90 degrees   See Function Navigator for Current Functional Status.   Therapy/Group: Individual Therapy  Sandi MariscalVictoria M Tere Mcconaughey 04/28/2016, 2:07 PM

## 2016-04-28 NOTE — Progress Notes (Signed)
Occupational Therapy Weekly Progress Note  Patient Details  Name: Luis [name] MRN: [id number] Date of Birth: 1995-08-02  Beginning of progress report period: April 20, 2016 End of progress report period: April 28, 2016  Patient has met 3 of 4 short term goals. Pt has steadily progresses with BADLs at bed level and functional transfers with sliding board.  Pt requires min A for bathing/dressing tasks at bed level and mod A for sliding board transfers on level surface.  Ongoing education for bed mobility techniques, transfers, and precautions.  Pt eagerly participates in therapies and incorporates new learning quickly. Patient continues to demonstrate the following deficits: muscle weakness, decreased cardiorespiratoy endurance, impaired timing and sequencing, abnormal tone, unbalanced muscle activation, decreased coordination and decreased motor planning and decreased sitting balance, decreased postural control, decreased balance strategies and difficulty maintaining precautions and therefore will continue to benefit from skilled OT intervention to enhance overall performance with BADL and Reduce care partner burden.  Patient progressing toward long term goals..  Continue plan of care.  OT Short Term Goals Week 1:  OT Short Term Goal 1 (Week 1): Bathe lower body at bed level with setup and mod assist OT Short Term Goal 1 - Progress (Week 1): Met OT Short Term Goal 2 (Week 1): Don pants with mod assist to perform rolls right to left  OT Short Term Goal 2 - Progress (Week 1): Met OT Short Term Goal 3 (Week 1): Groom at sink with setup and supervision OT Short Term Goal 3 - Progress (Week 1): Met OT Short Term Goal 4 (Week 1): Perform SB transfer bed<>w/c with mod assist. OT Short Term Goal 4 - Progress (Week 1): Progressing toward goal Week 2:  OT Short Term Goal 1 (Week 2): Perform SB transfer bed<>w/c with mod assist. OT Short Term Goal 2 (Week 2): Pt will perform LB dressing  with min A at bed level OT Short Term Goal 3 (Week 2): Pt will perform supine>sit EOB with max A OT Short Term Goal 4 (Week 2): Pt will perform drop arm BSC transfers with max A      Therapy Documentation Precautions:  Precautions Precautions: Back, Cervical, Fall Precaution Comments: BLE Ace wraps & SCD's, LUE 90 degrees or less Required Braces or Orthoses: Cervical Brace Cervical Brace: Hard collar, At all times Restrictions Weight Bearing Restrictions: Yes LUE Weight Bearing: Non weight bearing Other Position/Activity Restrictions: due to clavicular fx     See Function Navigator for Current Functional Status.     Leotis Shames [facility] 04/28/2016, 7:02 AM

## 2016-04-29 ENCOUNTER — Inpatient Hospital Stay (HOSPITAL_COMMUNITY): Payer: Self-pay | Admitting: *Deleted

## 2016-04-29 ENCOUNTER — Inpatient Hospital Stay (HOSPITAL_COMMUNITY): Payer: Self-pay

## 2016-04-29 ENCOUNTER — Inpatient Hospital Stay (HOSPITAL_COMMUNITY): Payer: Self-pay | Admitting: Physical Therapy

## 2016-04-29 NOTE — Progress Notes (Signed)
Occupational Therapy Session Note  Patient Details  Name: Luis [name] MRN: [id number] Date of Birth: [birth year]  Today's Date: 04/29/2016 OT Individual Time: 0930-1100 OT Individual Time Calculation (min): 90 min    Short Term Goals: Week 2:  OT Short Term Goal 1 (Week 2): Perform SB transfer bed<>w/c with mod assist. OT Short Term Goal 2 (Week 2): Pt will perform LB dressing with min A at bed level OT Short Term Goal 3 (Week 2): Pt will perform supine>sit EOB with max A OT Short Term Goal 4 (Week 2): Pt will perform drop arm BSC transfers with max A  Skilled Therapeutic Interventions/Progress Updates:    Pt resting in w/c upon arrival.  Pt transferred w/c>bed to doff pants in preparation for transfer to rolling shower chair.  Pt educated on importance of never using sliding board unless he is wearing pants or there is a cloth barrier between his legs and the board.  Pt verbalized understanding.  Pt transferred to shower chair and completed shower with assistance only for lower legs.  Pt used long handle sponge but was not satisfied with throughness.  Pt transferred back to bed and completed all dressing tasks requiring assistance only for donning brief and TED hose.  Pt requested to remain in bed at end of session. All needs within reach and sister present.   Therapy Documentation Precautions:  Precautions Precautions: Fall, Back, Cervical Precaution Comments: BLE Ace wraps & SCD's, LUE 90 degrees or less Required Braces or Orthoses: Cervical Brace Cervical Brace: Hard collar, At all times Restrictions Weight Bearing Restrictions: Yes LUE Weight Bearing: Weight bearing as tolerated Other Position/Activity Restrictions: no shoulder ROM above 90 degrees Pain: Pt denied pain See Function Navigator for Current Functional Status.   Therapy/Group: Individual Therapy  Rich BraveLanier, Kamee Bobst Chappell 04/29/2016, 11:02 AM
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
## 2016-04-29 NOTE — Progress Notes (Signed)
Occupational Therapy Note  Patient Details  Name: Luis [name] MRN: [id number] Date of Birth: [birth year]  Today's Date: 04/29/2016 OT Individual Time: 1330-1445 OT Individual Time Calculation (min): 75 min   Pt denied pain Individual therapy  Pt resting in bed upon arrival with sister present.  Initial focus on changing brief, redonning pants, donning leg loops, sit EOB, and transferring to w/c.  Pt is able to doff and don pants with supervision with long sitting.  Pt dons leg loops without assistance.  Pt required min A for supine>sit EOB and is able to lean onto his L elbow to facilitate placement of sliding board.  Pt requires steady A for sliding board transfer.  Pt propelled to gym and practiced placing and removing sliding board with lateral leans.  Pt required min A for sit>supine on mat to practice coming onto his elbows and sitting in long sitting, requiring min A.  Pt was able to doff, donn, and tie his shoes while crossing his feet over opposing LE.  Pt transferred back into w/c and practiced pressure relief techniques.  Pt propelled w/c to room and remained in w/c with sister present.    Lavone NeriLanier, Merion Caton [facility]Chappell 04/29/2016, 3:54 PM
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
## 2016-04-29 NOTE — Progress Notes (Signed)
Physical Therapy Session Note  Patient Details  Name: Luis [name] MRN: [id number] Date of Birth: 30-Apr-1995  Today's Date: 04/29/2016 PT Individual Time: 0800-0915 PT Individual Time Calculation (min): 75 min   Short Term Goals: Week 2:  PT Short Term Goal 1 (Week 2): Pt will perform functional transfers consistently with mod A PT Short Term Goal 2 (Week 2): Pt will perform w/c mobility with supervision 150' PT Short Term Goal 3 (Week 2): pt will perform bed mobility with mod A  Skilled Therapeutic Interventions/Progress Updates: Pt received supine in bed, denies pain and agreeable to treatment. Reports he had "a busy morning" with nursing assisting with toileting, and is currently finishing breakfast. Bed mobility with HOB elevated and bedrails, S to sit on EOB. Transfer bed>w/c with minA; family assisting heavily with w/c setup and transfer. W/c propulsion S to gym with BUE. Transfer w/c <>mat table with transfer board and min guard; requires setupA for placement of board however encouraged pt to setup chair, lock/unlock brakes, lift LE to place board to increase independence with transfer. Sitting balance on edge of mat while pt donned 3 pillowcases to pillows with min guard for balance for dynamic challenge with no UE support; occasional LOB posteriorly regained with use of UEs on edge of mat and min guard from therapist. Sit <>supine with leg loops and min guard, increased time and cues for technique. Supine<>long sit with min guard, pt denies pain during transition. In long sitting, pt doffs then dons BLE leg loops with S for sitting balance, no major LOB. Transfer w/c >couch with squat pivot, no AD per pt request d/t lower seat height. Required modA and two scoots to center on couch due to poor hip clearance. Pt lost shoe during transfer, required min guard for dynamic sitting balance to retrieve shoe from floor, pull RLE with leg loops into figure 4 position to don shoe. Transfer  couch>w/c uphill transfer with minA; sister assisting with stabilizing chair. Utilized rest breaks throughout session to discuss pressure relief in w/c and bed; pt able to correctly verbalize methods of pressure relief in w/c and frequency. Discussed w/c pushup insufficient for pressure relief at this time d/t inability to hold 2 min. Also discussed pressure relief in bed once transitioned to regular bed in hospital and at home, with position changes q 2 hrs. Recommend pt transition to regular bed prior to d/c home to allow pt and family to accommodate to pressure relief during night. Pt returned to room w/c propulsion with sister providing S at end of session.      Therapy Documentation Precautions:  Precautions Precautions: Fall, Back, Cervical Precaution Comments: BLE Ace wraps & SCD's, LUE 90 degrees or less Required Braces or Orthoses: Cervical Brace Cervical Brace: Hard collar, At all times Restrictions Weight Bearing Restrictions: Yes LUE Weight Bearing: Weight bearing as tolerated Other Position/Activity Restrictions: no shoulder ROM above 90 degrees Pain: Pain Assessment Pain Assessment: No/denies pain   See Function Navigator for Current Functional Status.   Therapy/Group: Individual Therapy  Vista Lawmanlizabeth J Tygielski 04/29/2016, 9:15 AM

## 2016-04-30 ENCOUNTER — Inpatient Hospital Stay (HOSPITAL_COMMUNITY): Payer: Self-pay | Admitting: Occupational Therapy

## 2016-04-30 NOTE — Progress Notes (Signed)
Occupational Therapy Session Note  Patient Details  Name: Luis [name] MRN: [id number] Date of Birth: [birth year]  Today's Date: 04/30/2016 OT Individual Time: 1000-1100 OT Individual Time Calculation (min): 60 min    Skilled Therapeutic Interventions/Progress Updates:    1:1 self care retraining at shower level. Pt performed bed mobility with HOB elevated with mod A and slide board transfer into roll in shower chair with mod A.  Performed lateral leans with min A to removed bed pad that was underneath him for transfer. Pt bathed at shower level 10/10 parts with setup and distant supervision with extra time. Cut out in the chair allowed access to bottom for hygiene. Pt transferred back into bed with mod A (with a 2nd person stabilizing equipment to increase safety (after bed pad placed under him).  Pt able to roll both ways in bed with min A with increased hip rotation with roll for bandage change and to don brief and pants. Pt able to pull up his own pants today with steadying A in bed with rolling. Pt returned to w/c with mod A. Pt perform grooming at sink mod I . Pt demonstrates good recall of all education and how best to setup in prep for transfers.   Therapy Documentation Precautions:  Precautions Precautions: Fall, Back, Cervical Precaution Comments: BLE Ace wraps & SCD's, LUE 90 degrees or less Required Braces or Orthoses: Cervical Brace Cervical Brace: Hard collar, At all times Restrictions Weight Bearing Restrictions: Yes LUE Weight Bearing: Weight bearing as tolerated Other Position/Activity Restrictions: no shoulder ROM above 90 degrees  Pain: Pain Assessment Pain Assessment: No/denies pain Pain Intervention(s): Medication (See eMAR) (scheduled)  See Function Navigator for Current Functional Status.   Therapy/Group: Individual Therapy  Roney MansSmith, Salah Nakamura [facility] 04/30/2016, 2:12 PM
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
## 2016-05-01 ENCOUNTER — Inpatient Hospital Stay (HOSPITAL_COMMUNITY): Payer: Medicaid Other | Admitting: Physical Therapy

## 2016-05-01 NOTE — Progress Notes (Signed)
Incontinent of urine X 2 during night. Dulcolax supp given this AM, no results thus far.

## 2016-05-01 NOTE — Progress Notes (Signed)
Physical Therapy Session Note  Patient Details  Name: Luis [name] MRN: [id number] Date of Birth: [birth year]  Today's Date: 05/01/2016 PT Individual Time: 1100-1155 PT Individual Time Calculation (min): 55 min   Short Term Goals: Week 1:  PT Short Term Goal 1 (Week 1): Pt will perform slide board transfers with mod assist +1. PT Short Term Goal 1 - Progress (Week 1): Progressing toward goal PT Short Term Goal 2 (Week 1): Pt will demonstrate static sitting balance with min assist. PT Short Term Goal 2 - Progress (Week 1): Met PT Short Term Goal 3 (Week 1): Pt will propel w/c 75 ft with min assist. PT Short Term Goal 3 - Progress (Week 1): Met  Skilled Therapeutic Interventions/Progress Updates:  Pt was seen bedside in the am, sitting up in w/c. Pt propelled w/c to gym with S and B UEs. Pt transferred w/c to edge of mat with sliding board and min guard to min A with verbal cues. Pt tolerated edge of mat about 25 minutes with c/s to min guard and occasional min A. While sitting on edge of mat worked on reaching with cones through multiple planes. Limited L UE to less than 90 degrees. Pt transferred edge of mat to supine with mod A and verbal cues. Pt transferred supine to edge of mat with mod A and verbal cues. Pt transferred edge of mat to w/c, w/c to edge of mat, edge of mat to w/c with min A and verbal cues with sliding board. Pt propelled w/c back to room with S. Pt left sitting up in w/c with family at bedside.   Therapy Documentation Precautions:  Precautions Precautions: Fall, Back, Cervical Precaution Comments: BLE Ace wraps & SCD's, LUE 90 degrees or less Required Braces or Orthoses: Cervical Brace Cervical Brace: Hard collar, At all times Restrictions Weight Bearing Restrictions: Yes LUE Weight Bearing: Weight bearing as tolerated Other Position/Activity Restrictions: no shoulder ROM above 90 degrees General:   Pain: No c/o pain  See Function Navigator for Current  Functional Status.   Therapy/Group: Individual Therapy  Dub Amis 05/01/2016, 12:11 PM
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
## 2016-05-02 ENCOUNTER — Inpatient Hospital Stay (HOSPITAL_COMMUNITY): Payer: Self-pay | Admitting: *Deleted

## 2016-05-02 ENCOUNTER — Inpatient Hospital Stay (HOSPITAL_COMMUNITY): Payer: Self-pay | Admitting: Occupational Therapy

## 2016-05-02 ENCOUNTER — Inpatient Hospital Stay (HOSPITAL_COMMUNITY): Payer: Medicaid Other | Admitting: Physical Therapy

## 2016-05-02 ENCOUNTER — Inpatient Hospital Stay (HOSPITAL_COMMUNITY): Payer: Self-pay

## 2016-05-02 DIAGNOSIS — S42025S Nondisplaced fracture of shaft of left clavicle, sequela: Secondary | ICD-10-CM

## 2016-05-02 MED ORDER — SENNA 8.6 MG PO TABS
1.0000 | ORAL_TABLET | Freq: Every day | ORAL | Status: DC
  Administered 2016-05-03 – 2016-05-13 (×11): 8.6 mg via ORAL
  Filled 2016-05-02 (×12): qty 1
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
## 2016-05-02 NOTE — Progress Notes (Signed)
[facility] to manage incontinence. Patient reports, spontaneously voids, without any control. Last PVR=49cc's. Bilateral PRAFO boots in place. Pain managed with scheduled ultram. Martina SinnerMurray, Aleeta Schmaltz A

## 2016-05-02 NOTE — Progress Notes (Addendum)
Physical Therapy Session Note  Patient Details  Name: Luis [name] MRN: [id number] Date of Birth: [birth year]  Today's Date: 05/02/2016 PT Individual Time: 0803-0900 and 4098-1191 PT Individual Time Calculation (min): 57 min and 62 min  Short Term Goals: Week 2:  PT Short Term Goal 1 (Week 2): Pt will perform functional transfers consistently with mod A PT Short Term Goal 2 (Week 2): Pt will perform w/c mobility with supervision 150' PT Short Term Goal 3 (Week 2): pt will perform bed mobility with mod A  Skilled Therapeutic Interventions/Progress Updates:    Treatment 1: Pt received in w/c eating breakfast with father present for session. While pt finished breakfast therapist discussed home set up, with father reporting he will build a ramp but it may not be finished prior to d/c and therapist educated pt/father on need to practice bumping up steps in w/c. Discussed need to transition to regular bed prior to d/c to allow pt/family practice with pressure relief every 2 hours. Pt reports he has consistently performed boosting with assistance of family and declined timer to assist with maintaining boosting schedule. Educated pt on need for 3-in-1 BSC upon d/c as his bathroom is too narrow to access with w/c. In gym pt able to set up w/c and slide board and complete slide board transfer with min assist and min cuing w/c<>level mat table. Pt able to problem solve positioning of w/c with min assist from therapist. Pt transferred sitting EOM>long sitting with mod assist for balance. Pt able to maintain long sitting and figure-four sitting position to doff and re-don shoes to challenge balance during functional tasks; pt required min assist<>supervision for sitting balance. Pt with 1 loss of balance posteriorly onto mat requiring assistance for controlled descent and total assist to return to long sitting. Pt discussing injury more on this date, with therapist providing emotional support and  encouragement regarding progress made thus far. Educated pt on outings with therapy and potential for pt to participate in outing prior to d/c; pt to think about this. At end of session pt left sitting in w/c in room with father & RN present.  Pt's leg loops appear to be too long, notified rehab tech regarding necessary modifications.  Addendum: Pt's father declined handout on how to build a ramp.   Treatment 2:  Pt received in bed & agreeable to tx. Pt's sister, Luis [name], present for session. Pt noted intermittent pain in R ribs with movement that ceased when resting; pt reports he is premedicated. Pt transferred supine>sitting EOB with supervision and use of leg loops. Pt propelled w/c throughout unit with BUE & supervision. Pt completed car transfer at level height with slide board and supervision. Pt requires min cuing for w/c set up, correct slide board placement, and head/hips relationship but with improved balance overall. Pt continues to require extra time to manage BLE with leg loops and will benefit once loops have been shortened. Pt transferred w/c<>tilt table via maximove total assist. Pt tolerated standing on tilt table ~10 minutes, up to 60 degrees with vitals as follows:  Sitting: BP = 121/68 mmHg, HR = 103 bpm Standing at 45 degrees: BP = 110/64 mmHg, HR = 110 bpm Standing at 60 degrees: BP = 102/66 mmHg, HR = 141 bpm (decreased to 115-126 bpm after a few minutes) Pt asymptomatic during activity  At end of session pt left in w/c propelling back to room with sister Luis [name]) accompanying him.   Therapy Documentation Precautions:  Precautions Precautions: Fall, Back,  Cervical Precaution Comments: BLE Ace wraps & SCD's, LUE 90 degrees or less Required Braces or Orthoses: Cervical Brace Cervical Brace: Hard collar, At all times Restrictions Weight Bearing Restrictions: Yes LUE Weight Bearing: Weight bearing as tolerated Other Position/Activity Restrictions: no shoulder ROM above 90  degrees   See Function Navigator for Current Functional Status.   Therapy/Group: Individual Therapy  Sandi MariscalVictoria M Miller 05/02/2016, 5:30 PM

## 2016-05-02 NOTE — Plan of Care (Signed)
Problem: SCI BLADDER ELIMINATION Goal: RH STG MANAGE BLADDER WITH ASSISTANCE STG Manage Bladder With mod Assistance   Outcome: Not Progressing Incontinent of urine, requiring total assist. Used condom cath tonight.

## 2016-05-02 NOTE — Progress Notes (Signed)
Patient refusing miralax over weekend. Discussed with PA, ordered to reduce senna to 1 tablet at hs, maintain Miralax scheduled. Discussed with patient need to keep consistent when establishing bowel program. Encourage patient to revisit plan on Thursday or Friday if needed and to take miralax as scheduled.

## 2016-05-02 NOTE — Plan of Care (Signed)
Problem: RH Other (Specify) Goal: RH LTG Other (Specify)1 Pt's caregiver will demonstrate ability to bump pt up 3 steps in w/c, without cuing from therapist, for home access.

## 2016-05-02 NOTE — Progress Notes (Signed)
Occupational Therapy Note  Patient Details  Name: [name] MRN: [id number] Date of Birth: Mar 24, 1996  Today's Date: 05/02/2016 OT Individual Time: 1300-1330 OT Individual Time Calculation (min): 30 min   Pt c/o "rib" soreness; RN aware Individual therapy  Pt resting in bed upon arrival with sister present.  Pt stated he thought he had urinated and needed to be "checked." Pt removed pants and rolled in bed without assistance.  Pt's brief was dry and pt pulled pants up.  Pt sat EOB without assistance and performed lateral lean to facilitate placement of sliding board.  Pt propelled to gym and transferred to mat.  Pt placed sliding board and performed transfer with steady A.  Pt engaged in dynamic sitting tasks with min A.  Pt handed to OTR.   Lavone NeriLanier, Deonta Bomberger [facility] 05/02/2016, 3:01 PM

## 2016-05-02 NOTE — Progress Notes (Addendum)
Occupational Therapy Session Note  Patient Details  Name: Luis [name] MRN: [id number] Date of Birth: September 29, 1995  Today's Date: 05/02/2016 OT Individual Time: 1333-1430 OT Individual Time Calculation (min): 57 min     Skilled Therapeutic Interventions/Progress Updates: Pt was received via OTA handoff sitting at EOM. Tx focus on static/dynamic sitting balance without bilateral UE support, postural control, and functional transfers. [facility], pt participated in reaching activity with unilateral UE outside of BOS. Pt exhibited anterior LOBs and fearfulness with anterior weight shift. Practiced lifting UEs off of mat (<90 degrees per L UE precautions) in static sit with pt able to complete x7 seconds max before fatiguing or losing his balance. Practiced isolated trunk flexion with arms at sides and returning to neural alignment, he required Min-Mod A to prevent LOBs. Multiple rest breaks provided to accommodate fatigue. Per pt, "I feel like I've run a mile." Afterwards he completed slideboard transfer back to w/c with min guard, with pt able to assist with placement/removal of board. He then self propelled back to room, transferred back to bed with slideboard in manner as written above. He was repositioned in bed for comfort and was left with sister Doree FudgeLuz at time of departure.       Therapy Documentation Precautions:  Precautions Precautions: Fall, Back, Cervical Precaution Comments: BLE Ace wraps & SCD's, LUE 90 degrees or less Required Braces or Orthoses: Cervical Brace Cervical Brace: Hard collar, At all times Restrictions Weight Bearing Restrictions: Yes LUE Weight Bearing: Weight bearing as tolerated Other Position/Activity Restrictions: no shoulder ROM above 90 degrees   Vital Signs: Therapy Vitals Temp: 97.7 F (36.5 C) Temp Source: Oral Pulse Rate: 100 Resp: 20 BP: 106/63 Patient Position (if appropriate): Lying Oxygen Therapy SpO2: 100 % O2 Device: Not  Delivered Pain: No c/o pain during session  Pain Assessment Pain Score: 0-No pain Pain Intervention(s): Medication (See eMAR) (scheduled) ADL: ADL ADL Comments: see Functional Assessment    See Function Navigator for Current Functional Status.   Therapy/Group: Individual Therapy  Azarius Lambson A Emree Locicero 05/02/2016, 3:56 PM

## 2016-05-02 NOTE — Plan of Care (Signed)
Problem: RH Bed to Chair Transfers Goal: LTG Patient will perform bed/chair transfers w/assist (PT) LTG: Patient will perform bed/chair transfers with assistance, with/without cues (PT).  Upgrade 2/2 progress  Problem: RH Car Transfers Goal: LTG Patient will perform car transfers with assist (PT) LTG: Patient will perform car transfers with assistance (PT).  Upgrade 2/2 progress  Problem: RH Furniture Transfers Goal: LTG Patient will perform furniture transfers w/assist (OT/PT LTG: Patient will perform furniture transfers  with assistance (OT/PT).  Upgrade 2/2 progress

## 2016-05-02 NOTE — Progress Notes (Signed)
Occupational Therapy Session Note  Patient Details  Name: Luis [name] MRN: [id number] Date of Birth: 1995-05-20  Today's Date: 05/02/2016 OT Individual Time: 1100-1200 OT Individual Time Calculation (min): 60 min    Short Term Goals: Week 2:  OT Short Term Goal 1 (Week 2): Perform SB transfer bed<>w/c with mod assist. OT Short Term Goal 2 (Week 2): Pt will perform LB dressing with min A at bed level OT Short Term Goal 3 (Week 2): Pt will perform supine>sit EOB with max A OT Short Term Goal 4 (Week 2): Pt will perform drop arm BSC transfers with max A  Skilled Therapeutic Interventions/Progress Updates:    Pt resting in w/c upon arrival.  Pt positioned w/c beside bed to prepare for transfer to bed for bathing/dressing tasks.  Pt required steady A for transfer.  Pt completed all bathing/dressing tasks at bed level, requiring assistance to don [facility]ose and bathe buttocks thoroughly.  Pt requires more than a reasonable amount of time to complete all tasks after setup.  Pt remained in bed with all needs within reach.  Focus on continued BADL retraining at bed leve, directing care, functional transfers, and activity tolerance.  Therapy Documentation Precautions:  Precautions Precautions: Fall, Back, Cervical Precaution Comments: BLE Ace wraps & SCD's, LUE 90 degrees or less Required Braces or Orthoses: Cervical Brace Cervical Brace: Hard collar, At all times Restrictions Weight Bearing Restrictions: Yes LUE Weight Bearing: Weight bearing as tolerated Other Position/Activity Restrictions: no shoulder ROM above 90 degrees Pain:  Pt denied pain but did request Kinesio Tape be reapplied.  See Function Navigator for Current Functional Status.   Therapy/Group: Individual Therapy  Rich BraveLanier, Damein Gaunce Chappell 05/02/2016, 12:19 PM

## 2016-05-02 NOTE — Progress Notes (Addendum)
Physical Therapy Weekly Progress Note  Patient Details  Name: Luis [name] MRN: [id number] Date of Birth: September 25, 1995  Beginning of progress report period: April 27, 2016 End of progress report period: May 02, 2016 (entered early in error)  Today's Date: 05/02/2016  Patient has met 3 of 3 short term goals.  Pt is now WBAT LUE (no ROM >90 degrees) and as a result has made great progress with functional mobility. Pt is currently at min assist progressing to supervision with transfers & bed mobility with use of leg loops. Pt would benefit from continued treatment to focus on pressure relief in regular hospital bed, pt & caregiver education for bumping up stairs, balance & transfer training.   Patient continues to demonstrate the following deficits muscle paralysis, decreased cardiorespiratoy endurance, and decreased sitting balance, decreased postural control and decreased balance strategies and therefore will continue to benefit from skilled PT intervention to increase functional independence with mobility.  Patient progressing toward long term goals. See new and updated LTG.  Continue plan of care.  PT Short Term Goals Week 2:  PT Short Term Goal 1 (Week 2): Pt will perform functional transfers consistently with mod A PT Short Term Goal 1 - Progress (Week 2): Met PT Short Term Goal 2 (Week 2): Pt will perform w/c mobility with supervision 150' PT Short Term Goal 2 - Progress (Week 2): Met PT Short Term Goal 3 (Week 2): pt will perform bed mobility with mod A PT Short Term Goal 3 - Progress (Week 2): Met Week 3:  PT Short Term Goal 1 (Week 3): STG = LTG due to estimated d/c date.   Therapy Documentation Precautions:  Precautions Precautions: Fall, Back, Cervical Precaution Comments: BLE Ace wraps & SCD's, LUE 90 degrees or less Required Braces or Orthoses: Cervical Brace Cervical Brace: Hard collar, At all times Restrictions Weight Bearing Restrictions: Yes LUE Weight  Bearing: Weight bearing as tolerated Other Position/Activity Restrictions: no shoulder ROM above 90 degrees  See Function Navigator for Current Functional Status.   Waunita Schooner 05/02/2016, 5:25 PM

## 2016-05-03 ENCOUNTER — Inpatient Hospital Stay (HOSPITAL_COMMUNITY): Payer: Self-pay | Admitting: Physical Therapy

## 2016-05-03 ENCOUNTER — Inpatient Hospital Stay (HOSPITAL_COMMUNITY): Payer: Self-pay

## 2016-05-03 ENCOUNTER — Ambulatory Visit (HOSPITAL_COMMUNITY): Payer: Medicaid Other | Admitting: Psychology

## 2016-05-03 ENCOUNTER — Inpatient Hospital Stay (HOSPITAL_COMMUNITY): Payer: Medicaid Other | Admitting: Physical Therapy

## 2016-05-03 DIAGNOSIS — F4323 Adjustment disorder with mixed anxiety and depressed mood: Secondary | ICD-10-CM

## 2016-05-03 DIAGNOSIS — S24109D Unspecified injury at unspecified level of thoracic spinal cord, subsequent encounter: Secondary | ICD-10-CM
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
## 2016-05-03 NOTE — Progress Notes (Signed)
Physical Therapy Session Note  Patient Details  Name: Luis [name] MRN: [id number] Date of Birth: [birth year]  Today's Date: 05/03/2016 PT Individual Time: 1340-1400 PT Individual Time Calculation (min): 20 min   Short Term Goals: Week 3:  PT Short Term Goal 1 (Week 3): STG = LTG due to estimated d/c date.  Skilled Therapeutic Interventions/Progress Updates: Pt participated in w/c mobility throughout unit and hospital.  Pt able to successfully negotiate narrow spaces and obstacle avoidance. Pt able to propel for 90% of session with no breaks. Pt with mild fatigue at end of session. Pt left in chair at end of session with sister present and all current needs met.      Therapy Documentation Precautions:  Precautions Precautions: Fall, Back, Cervical Precaution Comments: BLE Ace wraps & SCD's, LUE 90 degrees or less Required Braces or Orthoses: Cervical Brace Cervical Brace: Hard collar, At all times Restrictions Weight Bearing Restrictions: Yes LUE Weight Bearing: Weight bearing as tolerated Other Position/Activity Restrictions: no shoulder ROM above 90 degrees   See Function Navigator for Current Functional Status.   Therapy/Group: Individual Therapy  Luis [name]  Luis [name], PTA  05/03/2016, 3:47 PM

## 2016-05-03 NOTE — Progress Notes (Signed)
Occupational Therapy Session Note  Patient Details  Name: Luis [name] MRN: [id number] Date of Birth: [birth year]  Today's Date: 05/03/2016 OT Individual Time: [id number] OT Individual Time Calculation (min): 87 min    Short Term Goals: Week 2:  OT Short Term Goal 1 (Week 2): Perform SB transfer bed<>w/c with mod assist. OT Short Term Goal 2 (Week 2): Pt will perform LB dressing with min A at bed level OT Short Term Goal 3 (Week 2): Pt will perform supine>sit EOB with max A OT Short Term Goal 4 (Week 2): Pt will perform drop arm BSC transfers with max A  Skilled Therapeutic Interventions/Progress Updates:    Pt engaged in BADL retraining including bathing at shower level and dressing at bed level.  Pt transferred to rolling shower chair with mod A (sliding board) and completed bathing requiring assistance only for thoroughness with buttocks.  Pt transferred back to bed and completed dressing tasks with assistance only for donning [facility].  Pt completes all dressing tasks while seated in long sitting, crossing legs to don socks and thread pants.  Pt transferred back to w/c and completed grooming tasks at sink.  Pt remained in w/c with sister present.   Therapy Documentation Precautions:  Precautions Precautions: Fall, Back, Cervical Precaution Comments: BLE Ace wraps & SCD's, LUE 90 degrees or less Required Braces or Orthoses: Cervical Brace Cervical Brace: Hard collar, At all times Restrictions Weight Bearing Restrictions: Yes LUE Weight Bearing: Weight bearing as tolerated Other Position/Activity Restrictions: no shoulder ROM above 90 degrees   Pain: Pain Assessment Pain Assessment: No/denies pain Pain Score: 0-No pain  See Function Navigator for Current Functional Status.   Therapy/Group: Individual Therapy  Rich BraveLanier, Luis [name] 05/03/2016, 9:28 AM

## 2016-05-03 NOTE — Progress Notes (Signed)
Physical Therapy Session Note  Patient Details  Name: Luis [name] MRN: [id number] Date of Birth: 1996/01/05  Today's Date: 05/03/2016 PT Individual Time: 1003-1130 PT Individual Time Calculation (min): 87 min   Short Term Goals: Week 3:  PT Short Term Goal 1 (Week 3): STG = LTG due to estimated d/c date.  Skilled Therapeutic Interventions/Progress Updates:    Pt received in bed & agreeable to tx. Pt noted pain in R ribs with touch & movement but reports he is premedicated. Therapist donned BLE leg loops total assist for time management. Pt transferred supine>sitting EOB with supervision and use of hospital bed features. Pt completed slide board transfers bed>w/c and w/c<>mat table with supervision/min assist with cuing for safety awareness and technique. Pt still requires encouragement to be advocate for himself and direct therapist/sister in assisting with mobility. Pt tolerated sitting on EOM for ~1 hour with BUE support and close supervision for balance while participating in w/c eval with therapist, sister [name](Luz), and Sharia ReeveJosh from [facility]. Decided that pt will received [employer] Rogue, ultralight w/c, with hybrid roho cushion. At end of session pt left in care of sister, propelling w/c back to room.   Therapy Documentation Precautions:  Precautions Precautions: Fall, Back, Cervical Precaution Comments: BLE Ace wraps & SCD's, LUE 90 degrees or less Required Braces or Orthoses: Cervical Brace Cervical Brace: Hard collar, At all times Restrictions Weight Bearing Restrictions: Yes LUE Weight Bearing: Weight bearing as tolerated Other Position/Activity Restrictions: no shoulder ROM above 90 degrees  See Function Navigator for Current Functional Status.   Therapy/Group: Individual Therapy  Sandi MariscalVictoria M Nguyet Mercer 05/03/2016, 12:15 PM

## 2016-05-03 NOTE — Progress Notes (Signed)
Physical Therapy Session Note  Patient Details  Name: Luis [name] MRN: [id number] Date of Birth: 1995/07/01  Today's Date: 05/03/2016 PT Individual Time: 1430-1535 PT Individual Time Calculation (min): 65 min   Short Term Goals: Week 3:  PT Short Term Goal 1 (Week 3): STG = LTG due to estimated d/c date.  Skilled Therapeutic Interventions/Progress Updates: Pt received seated in w/c with sister present; denies pain and agreeable to treatment. W/c propulsion throughout session modI on level surfaces. Seated in w/c pt dons B leg loops with S and increased time, with difficulty positioning LEs, reaching toward floor to don and requires several attempts to put on tight enough. Lateral scoot transfer w/c> bed at elevated height 27" with modA due to difficulty maintaining balance once on EOB with feet elevated off floor. Sit >supine minA for LLE management. Rolling R/L with min cues for technique and setup for LE placement to A with rolling as pt does not have bedrails available on ADL apartment bed. Demonstrated to pt/sister various sleeping positions including placement of pillows between bony prominences for skin protection and pressure relief. Discussed need for position changes q2 hrs. Supine>sit with S using leg loops and min cues for technique to scoot toward EOB. Transfer bed>w/c downhill with min guard. Transfer w/c <>mat table squat pivot with modA; demo to pt head/hips relationship and significant upper body lean required to pop up hips over wheel onto mat. While pt seated on edge of mat with S, therapist adjusted w/c brakes for safety to reduce chair from sliding while pt transferring. Demo to pt performance of wheelie in w/c, discussed use of wheelie to pop over lip in doorway and onto curb step. Pt able to achieve and maintain wheelie position 2-3 sec with min guard/minA with occasional posterior LOB. Remained seated in w/c to propel back to room modI with sister present at end of  session.       Therapy Documentation Precautions:  Precautions Precautions: Fall, Back, Cervical Precaution Comments: BLE Ace wraps & SCD's, LUE 90 degrees or less Required Braces or Orthoses: Cervical Brace Cervical Brace: Hard collar, At all times Restrictions Weight Bearing Restrictions: Yes LUE Weight Bearing: Weight bearing as tolerated Other Position/Activity Restrictions: no shoulder ROM above 90 degrees   See Function Navigator for Current Functional Status.   Therapy/Group: Individual Therapy  Vista Lawmanlizabeth J Tygielski 05/03/2016, 3:42 PM
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
## 2016-05-04 ENCOUNTER — Inpatient Hospital Stay (HOSPITAL_COMMUNITY): Payer: Medicaid Other | Admitting: Physical Therapy

## 2016-05-04 ENCOUNTER — Ambulatory Visit (HOSPITAL_COMMUNITY): Payer: Medicaid Other | Admitting: Psychology

## 2016-05-04 ENCOUNTER — Inpatient Hospital Stay (HOSPITAL_COMMUNITY): Payer: Self-pay

## 2016-05-04 ENCOUNTER — Inpatient Hospital Stay (HOSPITAL_COMMUNITY): Payer: Medicaid Other | Admitting: Occupational Therapy

## 2016-05-04 DIAGNOSIS — F4323 Adjustment disorder with mixed anxiety and depressed mood: Secondary | ICD-10-CM

## 2016-05-04 MED ORDER — METOPROLOL TARTRATE 12.5 MG HALF TABLET
12.5000 mg | ORAL_TABLET | Freq: Two times a day (BID) | ORAL | Status: DC
  Administered 2016-05-04 – 2016-05-13 (×18): 12.5 mg via ORAL
  Filled 2016-05-04 (×18): qty 1

## 2016-05-04 NOTE — Progress Notes (Signed)
Occupational Therapy Session Note  Patient Details  Name: [name] MRN: [id number] Date of Birth: 09-23-1995  Today's Date: 05/04/2016 OT Individual Time: 1030-1200 OT Individual Time Calculation (min): 90 min    Short Term Goals: Week 2:  OT Short Term Goal 1 (Week 2): Perform SB transfer bed<>w/c with mod assist. OT Short Term Goal 2 (Week 2): Pt will perform LB dressing with min A at bed level OT Short Term Goal 3 (Week 2): Pt will perform supine>sit EOB with max A OT Short Term Goal 4 (Week 2): Pt will perform drop arm BSC transfers with max A  Skilled Therapeutic Interventions/Progress Updates:    Pt resting in w/c upon arrival.  Pt propelled to therapy gym and transferred to mat.  Pt practiced threading pants over BLE using reacher while seated EOM.  Pt required mod A to complete task.  Pt practiced pulling pants over hips incorporating later leans.  Pt required min A.  Pt practiced scoot transfers mat<>padded tub bench.  Pt completed transfers with steady A.  Pt will not be able to access bathroom at home but will use padded bench for bowel program.  Pt transferred back to w/c and returned to room.   Therapy Documentation Precautions:  Precautions Precautions: Fall, Back, Cervical Precaution Comments: BLE Ace wraps & SCD's, LUE 90 degrees or less Required Braces or Orthoses: Cervical Brace Cervical Brace: Hard collar, At all times Restrictions Weight Bearing Restrictions: Yes LUE Weight Bearing: Weight bearing as tolerated Other Position/Activity Restrictions: no shoulder ROM above 90 degrees   Pain: Pain Assessment Pain Assessment: No/denies pain  See Function Navigator for Current Functional Status.   Therapy/Group: Individual Therapy  Rich BraveLanier, Shatina Streets Chappell 05/04/2016, 12:09 PM

## 2016-05-04 NOTE — Consult Note (Signed)
WOC Nurse wound follow-up consult note Stage 3 pressure injury re-assessed, has decreased significantly in size since previous assessment. Luis [name] is very thin with protruding sacrum bone over the location, discussed the plan of care with him. .8X.2X.1cm, 100% red and moist, surrounded by pink dry scar tissue.  Foam dressing has been applied to protect and promote healing, Luis [name] has air mattress to decrease pressure. Continue present plan of care and discussed importance of offloading to the affected area frequently when OOB to wheelchair. Cammie Mcgeeawn Amillya Chavira MSN, RN, CWOCN, [place] [place]-AP, CNS [phone]

## 2016-05-04 NOTE — Progress Notes (Signed)
Patient:  Luis [name]   DOB: 07-17-1995  MR Number: [id number]  [facility] [address] [id number] [place] [id number] Dept: [phone] Loc: [phone]  Start: 12:45 PM End: 1:30 PM  Provider/Observer:     Hershal CoriaJohn R Jovany Disano PSYD  Chief Complaint:      Chief Complaint  Patient presents with  . Depression    Reason For Service:     The patient was referred for psychological/neuropsychological assessment and review regarding coping issues related to adjustment to severe traumatic injury.  He has suffered spinal cord injury with paraplegia as well as concern for TBI.  The patient had asked after first appointment to talk again to review some of the coping issues we had addressed prior.  Interventions Strategy:  Cognitive Behavioral Interventions and coping skills  Participation Level:   Active  Participation Quality:  Appropriate, Attentive and Sharing      Behavioral Observation:  Well Groomed, Alert, and Appropriate.   Current Psychosocial Factors: The patient has a very supportive family and appears to have the support he will need upon discharge.   Content of Session:   Reviewed current situation and worked on further developing his coping and psychological adaptive skills.  Current Status:   The patient appears to be doing well psychologically given the sitatuation.  The patient denies continuation of anxiety or depressive symptoms.   Patient Progress:   Very good psychologically.    Target Goals:   Target goals were to work on skills around adapting to significant physical limiations.  Last Reviewed:   05/04/2016  Goals Addressed Today:    Worked on furthering his coping skills.  Impression/Diagnosis:   The patient is trying to adjust to the acute changes in physical functioning and the real possibility that he will not regain the use of his legs.  He had been a  Tourist information centre managerprideful worker and now does not know what the future will hold for him.  He does appear to have adapted well and there is no further plan for neuropsychological/psychological interventions.  Diagnosis:   Adjustment disorder with mixed anxiety and depressed mood

## 2016-05-04 NOTE — Progress Notes (Signed)
Physical Therapy Session Note  Patient Details  Name: Luis [name] MRN: [id number] Date of Birth: December 19, 1995  Today's Date: 05/04/2016 PT Individual Time: 1450-1617 PT Individual Time Calculation (min): 87 min   Short Term Goals: Week 3:  PT Short Term Goal 1 (Week 3): STG = LTG due to estimated d/c date.  Skilled Therapeutic Interventions/Progress Updates:    Pt received in w/c & agreeable to tx, denying c/o pain. Session focused on pt education, transfers, and upright tolerance. Pt completed transfers w/c<>elevated mat with steady assist for slide board set up and steady assist for balance during transfer. Pt continues to demonstrate decreased balance when positioning LE for transfers. Educated pt on proper positioning of leg loops around thigh but this needs to be reinforced. Educated pt on w/c cushion and need to complete inspection daily. Educated pt on how to inflate and deflate the cushion with pt return demonstrating; pt would benefit from further education of this. Pt transferred onto tilt table via maximove total assist. Pt tolerated standing on tilt table 30 minutes for upright tolerance and weight bearing through BLE. Pt's vitals as follows:  Sitting: BP = 119/66 mmHg, HR = 113 bpm 40 degrees: BP = 108/61 mmHg, HR = 118 bpm 50 degrees: BP = 107/68 mmHg, HR = 122 bpm 60 degrees: BP = 117/63 mmHg, HR = 122 bpm 70 degrees: BP = 104/64 mmhHg, HR = 128 bpm 80 degrees: BP = 114/70 mmHg, HR = 139 bpm  Pt's father reports ramp will probably not be installed prior to d/c; educated pt & father on increased independence for pt with ramp acces to home. Pt & caregivers will need to be trained on how to bump up steps in w/c prior to d/c.  At end of session pt left sitting in w/c in room with mother present to supervise. Reinforced need for pt to practice boosting in 30 minutes.  Educated pt on outing and benefits of participating in outing prior to d/c.  Therapy  Documentation Precautions:  Precautions Precautions: Fall, Back, Cervical Precaution Comments: BLE Ace wraps & SCD's, LUE 90 degrees or less Required Braces or Orthoses: Cervical Brace Cervical Brace: Hard collar, At all times Restrictions Weight Bearing Restrictions: Yes LUE Weight Bearing: Weight bearing as tolerated Other Position/Activity Restrictions: no shoulder ROM above 90 degrees  See Function Navigator for Current Functional Status.   Therapy/Group: Individual Therapy  Sandi MariscalVictoria M [name] 05/04/2016, 4:54 PM
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
## 2016-05-04 NOTE — Progress Notes (Signed)
Occupational Therapy Note  Patient Details  Name: [name] MRN: [id number] Date of Birth: 09/30/1995  Today's Date: 05/04/2016 OT Individual Time: 1400-1430 OT Individual Time Calculation (min): 30 min   Pt denied pain Individual therapy  Pt resting in bed upon arrival with father present.  Pt performed SBT to w/c and propelled to gym.  After transferring to mat pt performed supine<>long sitting on mat X 3. Pt required min verbal cues and min A on first task but completed remaining tasks with close supervision.  Pt transferred to w/c and returned to room.  Pt requires min A for w/c/sliding board setup and steady A during transfers with min verbal cues for BLE positioning.    Lavone NeriLanier, Aloura Matsuoka [facility] 05/04/2016, 2:53 PM
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
## 2016-05-04 NOTE — Progress Notes (Signed)
Occupational Therapy Session Note  Patient Details  Name: Luis [name] MRN: [id number] Date of Birth: 1995/07/26  Today's Date: 05/04/2016 OT Individual Time: 0801-0900 OT Individual Time Calculation (min): 59 min    Short Term Goals: Week 2:  OT Short Term Goal 1 (Week 2): Perform SB transfer bed<>w/c with mod assist. OT Short Term Goal 2 (Week 2): Pt will perform LB dressing with min A at bed level OT Short Term Goal 3 (Week 2): Pt will perform supine>sit EOB with max A OT Short Term Goal 4 (Week 2): Pt will perform drop arm BSC transfers with max A  Skilled Therapeutic Interventions/Progress Updates:    Upon entering the room, pt supine in bed with no c/o pain and requesting to take shower this session. Pt coming into long sitting to remove clothing with supervision for safety. Slide board transfer from bed> roll in shower chair with total A. Pt bathing at shower level with supervision and set up A to obtain all needed items. Pt transferred back into bed for dressing tasks and changing of pads on C collar. Pt given increased assistance this session for LB dressing secondary to time management. Pt supine in bed with call bell and all needed items as therapist exited the room.   Therapy Documentation Precautions:  Precautions Precautions: Fall, Back, Cervical Precaution Comments: BLE Ace wraps & SCD's, LUE 90 degrees or less Required Braces or Orthoses: Cervical Brace Cervical Brace: Hard collar, At all times Restrictions Weight Bearing Restrictions: Yes LUE Weight Bearing: Weight bearing as tolerated Other Position/Activity Restrictions: no shoulder ROM above 90 degrees General:   Vital Signs: Therapy Vitals Temp: 98 F (36.7 C) Temp Source: Oral Pulse Rate: 85 Resp: 18 BP: (!) 116/57 Patient Position (if appropriate): Lying Oxygen Therapy SpO2: 100 % O2 Device: Not Delivered Pain: Pain Assessment Pain Score: 2  ADL: ADL ADL Comments: see Functional  Assessment Exercises:   Other Treatments:    See Function Navigator for Current Functional Status.   Therapy/Group: Individual Therapy  Alen BleacherBradsher, Lupe Handley P 05/04/2016, 2:29 PM

## 2016-05-05 ENCOUNTER — Inpatient Hospital Stay (HOSPITAL_COMMUNITY): Payer: Medicaid Other | Admitting: Occupational Therapy

## 2016-05-05 ENCOUNTER — Inpatient Hospital Stay (HOSPITAL_COMMUNITY): Payer: Medicaid Other

## 2016-05-05 ENCOUNTER — Inpatient Hospital Stay (HOSPITAL_COMMUNITY): Payer: Self-pay

## 2016-05-05 ENCOUNTER — Inpatient Hospital Stay (HOSPITAL_COMMUNITY): Payer: Medicaid Other | Admitting: Physical Therapy

## 2016-05-05 ENCOUNTER — Inpatient Hospital Stay (HOSPITAL_COMMUNITY): Payer: Self-pay | Admitting: Physical Therapy

## 2016-05-05 MED ORDER — OXYBUTYNIN CHLORIDE 5 MG PO TABS
5.0000 mg | ORAL_TABLET | Freq: Two times a day (BID) | ORAL | Status: DC
  Administered 2016-05-05 – 2016-05-06 (×3): 5 mg via ORAL
  Filled 2016-05-05 (×3): qty 1

## 2016-05-05 NOTE — Progress Notes (Signed)
Occupational Therapy Session Note  Patient Details  Name: [name] MRN: [id number] Date of Birth: January 14, 1996  Today's Date: 05/05/2016 OT Individual Time: 1115-1200 and 1330-1430 OT Individual Time Calculation (min): 45 min and 60 min    Short Term Goals: Week 3:  OT Short Term Goal 1 (Week 3): STG=LTG secondary to ELOS   Skilled Therapeutic Interventions/Progress Updates:    Session 1: Upon entering the room, pt seated in wheelchair. Skilled OT intervention with focus on functional transfers, simulate home environment/set up, pt and family education, and lateral leans. Pt set up wheelchair for transfer from wheelchair > padded TTB for toileting. Pt unable to access bathroom at home and will use padded TTB. Pt required assistance for placement on slide board. Mod A transfer from w/c to bench with OT providing cues for pt to manage B LEs with use of B LE loops. Theraband tied around waist with lateral leans into wheelchair and onto L side of bench to pull over B hips. Pt needing min cues for technique and min A for seated balance. OT recommended that pt placed padded bench next to bed at home in order to fully lean to L to get enough hip clearance with leans to manage clothing. Pt returning to wheelchair and needing increased assistance to steady board secondary to uneven surface. Pt remained in wheelchair for lunch. Call bell and all needed items within reach upon exiting the room.   Session 2: Upon entering the room, pt seated in wheelchair and donned coat with set up A. Pt propelled wheelchair 300' with mod I and OT assisted the rest of the way outside for energy conservation. Pt propelled wheelchair over uneven surfaces, upwards, downwards, and up/down ramp. Pt needing increased time and cues for technique but able to propel wheelchair up ramp. Pt reports feeling slight anxiety as wheelchair tips with effort of pushing wheelchair up ramp but anti tippers are in place. Pt needing  rest break once reaching top of ramp. Pt also demonstrating ability to stop wheelchair on command when rolling down ramp as he picks up speed. Pt returned to room at end of session and remains in wheelchair. Mother remains present in room.   Therapy Documentation Precautions:  Precautions Precautions: Fall, Back, Cervical Precaution Comments: BLE Ace wraps & SCD's, LUE 90 degrees or less Required Braces or Orthoses: Cervical Brace Cervical Brace: Hard collar, At all times Restrictions Weight Bearing Restrictions: Yes LUE Weight Bearing: Weight bearing as tolerated Other Position/Activity Restrictions: no shoulder ROM above 90 degrees General:   Vital Signs: Therapy Vitals Temp: 98.1 F (36.7 C) Temp Source: Oral Pulse Rate: (!) 108 Resp: 20 BP: 99/62 Patient Position (if appropriate): Sitting Oxygen Therapy SpO2: 95 % O2 Device: Not Delivered Pain: Pain Assessment Pain Assessment: No/denies pain Pain Score: 0-No pain ADL: ADL ADL Comments: see Functional Assessment Exercises:   Other Treatments:    See Function Navigator for Current Functional Status.   Therapy/Group: Individual Therapy  Alen BleacherBradsher, Decarla Siemen P 05/05/2016, 5:42 PM

## 2016-05-05 NOTE — Plan of Care (Signed)
Problem: SCI BOWEL ELIMINATION Goal: RH STG SCI MANAGE BOWEL PROGRAM W/ASSIST OR AS APPROPRIATE STG SCI Manage bowel program w max assist . Begin bowel program 04/28/16. 2 senna-cot daily at 2000, 1 dulcolax suppository at 0600, wait 15 minutes then perform digital stimulation and place patient on bedside commode or toilet  Outcome: Not Progressing Total A

## 2016-05-05 NOTE — Progress Notes (Signed)
Physical Therapy Session Note  Patient Details  Name: Luis [name] MRN: [id number] Date of Birth: May 03, 1995  Today's Date: 05/05/2016 PT Individual Time: [id number] PT Individual Time Calculation (min): 32 min   Short Term Goals: Week 3:  PT Short Term Goal 1 (Week 3): STG = LTG due to estimated d/c date.  Skilled Therapeutic Interventions/Progress Updates:   Patient in bed upon arrival with mom present. Performed supine > sit with HOB raised and use of leg loops, wheelchair mobility in controlled environment mod I, and slide board transfers from bed > wheelchair <> level mat table with assist for slide board placement from compliant air mattress and assist to cross LLE over RLE when placing slide board to return to wheelchair from mat table, steady assist on air mattress and close supervision on firm mat for transfers. To challenge sitting balance, patient completed pipetree puzzle with single UE support or single UE as stabilizer and reaching task to retrieve and stack cups anteriorly to midline, R, and L with close supervision. Patient left sitting in wheelchair with mom to return to room.   Therapy Documentation Precautions:  Precautions Precautions: Fall, Back, Cervical Precaution Comments: BLE Ace wraps & SCD's, LUE 90 degrees or less Required Braces or Orthoses: Cervical Brace Cervical Brace: Hard collar, At all times Restrictions Weight Bearing Restrictions: Yes LUE Weight Bearing: Weight bearing as tolerated Other Position/Activity Restrictions: no shoulder ROM above 90 degrees Pain: Pain Assessment Pain Assessment: No/denies pain Pain Score: 0-No pain   See Function Navigator for Current Functional Status.   Therapy/Group: Individual Therapy  Luis [name], [name] 05/05/2016, 4:47 PM

## 2016-05-05 NOTE — Progress Notes (Signed)
Physical Therapy Note  Patient Details  Name: Luis [name] MRN: [id number] Date of Birth: 1995/08/17 Today's Date: 05/05/2016    Time: 1030-1110 40 minutes  1:1 No c/o pain.  Treatment session focused on pt increasing independence with bed mobility.  Bed mobility in ADL apartment with mod A for uphill sliding board transfers, mod/max A for sitting balance on compliant surface of mattress.  Min A for Sit to supine and mod  A for supine to sit due to compliant surface.  Pt able to manage LEs for placement of sliding board and assist with managing LEs in and out of bed.  Continued training on mat in gym at 24'' (pt's home bed height) with pt able to perform with close supervision for transfer, min A for balance and supine to sit. Pt improves with firm surface. Encouraged pt to continue to practice on compliant surfaces to mimic home.  Pt states he forgot to pump up w/c cushion this morning, PT encouraged pt to do this in next session.   Merline Perkin 05/05/2016, 11:09 AM

## 2016-05-05 NOTE — Plan of Care (Signed)
Problem: RH PAIN MANAGEMENT Goal: RH STG PAIN MANAGED AT OR BELOW PT'S PAIN GOAL Pain scale less than 4  Outcome: Progressing No c/o pain

## 2016-05-05 NOTE — Progress Notes (Signed)
Occupational Therapy Weekly Progress Note  Patient Details  Name: Luis [name] MRN: [id number] Date of Birth: 04-01-1995  Beginning of progress report period: April 28, 2016 End of progress report period: May 05, 2016  Patient has met 4 of 4 short term goals. Pt is making steady progress with BADLs, functional transfers, and unsupported sitting balance.  Pt is able to doff/donn LB clothing items in unsupported long sitting.  Pt completes bathing tasks at bed level in unsupported long sitting.  Pt performs sliding board transfers with steady A and is able to place and remove sliding board without assistance.  Pt's father and sister have been present for therapy.   Patient continues to demonstrate the following deficits: muscle weakness, decreased cardiorespiratoy endurance, balance and decreased sitting balance and decreased balance strategies, B UE and LE weakness and therefore will continue to benefit from skilled OT intervention to enhance overall performance with BADL.  Patient progressing toward long term goals..  Continue plan of care.  OT Short Term Goals Week 2:  OT Short Term Goal 1 (Week 2): Perform SB transfer bed<>w/c with mod assist. OT Short Term Goal 1 - Progress (Week 2): Met OT Short Term Goal 2 (Week 2): Pt will perform LB dressing with min A at bed level OT Short Term Goal 2 - Progress (Week 2): Met OT Short Term Goal 3 (Week 2): Pt will perform supine>sit EOB with max A OT Short Term Goal 3 - Progress (Week 2): Met OT Short Term Goal 4 (Week 2): Pt will perform drop arm BSC transfers with max A OT Short Term Goal 4 - Progress (Week 2): Met Week 3:  OT Short Term Goal 1 (Week 3): STG=LTG secondary to ELOS   Therapy Documentation Precautions:  Precautions Precautions: Fall, Back, Cervical Precaution Comments: BLE Ace wraps & SCD's, LUE 90 degrees or less Required Braces or Orthoses: Cervical Brace Cervical Brace: Hard collar, At all  times Restrictions Weight Bearing Restrictions: Yes LUE Weight Bearing: Weight bearing as tolerated Other Position/Activity Restrictions: no shoulder ROM above 90 degrees  See Function Navigator for Current Functional Status.    Leotis Shames [facility] 05/05/2016, 6:57 AM

## 2016-05-05 NOTE — Progress Notes (Signed)
AM bowel program effective, resulting in a small, soft bowel movement. No additional 'unplanned' bowel movement reported this shift.

## 2016-05-05 NOTE — Progress Notes (Signed)
Social Work Patient ID: Luis [name], male   DOB: March 30, 1995, 21 y.o.   MRN: [id number]  Met with pt and father following team conference.  Both feeling they will be "ready" for d/c on the 16th and father reports ramp will be completed prior to d/c.  Discussion of possible hospital bed at home, however, pt does not want this - discussed with OT this morning and we will continue to talk with pt.  Neuropsych able to consult on pt this week.  Will continue to follow.  Luis Diesing, LCSW
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
## 2016-05-05 NOTE — Progress Notes (Signed)
Occupational Therapy Session Note  Patient Details  Name: Luis [name] MRN: [id number] Date of Birth: [birth year]  Today's Date: 05/05/2016 OT Individual Time: [id number] OT Individual Time Calculation (min): 87 min    Short Term Goals: Week 3:  OT Short Term Goal 1 (Week 3): STG=LTG secondary to ELOS  Skilled Therapeutic Interventions/Progress Updates:    Pt engaged in BADL retraining including bathing/dressing at bed level with use of bed rails.  Pt and father presently do not want to have hospital bed at home.  Discussed with pt we would have him practice bathing/dressing tasks on regular bed without rails.  Discussed with PA about getting bed changed to regular hospital bed.  Pt completes all bathing/dressing tasks with assistance only for assuring thoroughness of buttocks. Pt sat EOB with supervision and transferred to w/c with steady A.  Pt remained in w/c awaiting next therapy.   Therapy Documentation Precautions:  Precautions Precautions: Fall, Back, Cervical Precaution Comments: BLE Ace wraps & SCD's, LUE 90 degrees or less Required Braces or Orthoses: Cervical Brace Cervical Brace: Hard collar, At all times Restrictions Weight Bearing Restrictions: Yes LUE Weight Bearing: Weight bearing as tolerated Other Position/Activity Restrictions: no shoulder ROM above 90 degrees Pain:  Pt denied pain  See Function Navigator for Current Functional Status.   Therapy/Group: Individual Therapy  Rich BraveLanier, Julyan Gales Chappell 05/05/2016, 9:29 AM
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
## 2016-05-06 ENCOUNTER — Inpatient Hospital Stay (HOSPITAL_COMMUNITY): Payer: Self-pay | Admitting: Physical Therapy

## 2016-05-06 ENCOUNTER — Inpatient Hospital Stay (HOSPITAL_COMMUNITY): Payer: Self-pay

## 2016-05-06 MED ORDER — OXYBUTYNIN CHLORIDE 5 MG PO TABS
5.0000 mg | ORAL_TABLET | Freq: Three times a day (TID) | ORAL | Status: DC
  Administered 2016-05-06 – 2016-05-09 (×9): 5 mg via ORAL
  Filled 2016-05-06 (×10): qty 1

## 2016-05-06 NOTE — Progress Notes (Signed)
Physical Therapy Note  Patient Details  Name: Luis [name] MRN: [id number] Date of Birth: 1995/04/21 Today's Date: 05/06/2016    Time: 1430-1525 55 minutes  1:1 No c/o pain.  Pt performed transfers to mat and w/c <> w/c during session with close supervision/min A for LE placement during transfers.  Supine <> long sitting and circle sitting multiple reps with close supervision and increased time.  Pt able to maintain balance in long sitting and circle sitting with supervision.  Able to don/doff shoes in circle sitting with supervision.  Pt transferred to loaner w/c and PT demo'd and pt performed parts management.  Josh at advanced emailed to see about switching to standard length arm rests and regular foot rests.   Broxton Broady 05/06/2016, 3:25 PM

## 2016-05-06 NOTE — Progress Notes (Signed)
Occupational Therapy Session Note  Patient Details  Name: Luis [name] MRN: [id number] Date of Birth: 08/21/1995  Today's Date: 05/06/2016 OT Individual Time: 0930-1100 OT Individual Time Calculation (min): 90 min    Short Term Goals: Week 3:  OT Short Term Goal 1 (Week 3): STG=LTG secondary to ELOS  Skilled Therapeutic Interventions/Progress Updates:    Pt engaged in ongoing BADL retraining including bathing at shower level (rolling shower chair) and dressing at bed level.  Pt completes all bathing tasks at supervision level and is able reach buttocks reaching over side of chair.  Pt requires assistance to don TED hose and brief but completes all other dressing tasks without assistance.  Pt performs all transfers with steady A.  Pt is able to place and remove sliding board without assistance.  Pt currently requires use of bed rails on air mattress to go from supine to long sitting.  His bed should be changed to regular hospital bed next week and pt can practice tasks without bed rails.  Pt remained in w/c with all needs within reach awaiting next therapy.  Therapy Documentation Precautions:  Precautions Precautions: Fall, Back, Cervical Precaution Comments: BLE Ace wraps & SCD's, LUE 90 degrees or less Required Braces or Orthoses: Cervical Brace Cervical Brace: Hard collar, At all times Restrictions Weight Bearing Restrictions: Yes LUE Weight Bearing: Weight bearing as tolerated Other Position/Activity Restrictions: no shoulder ROM above 90 degrees   Pain: Pain Assessment Pain Assessment: No/denies pain  See Function Navigator for Current Functional Status.   Therapy/Group: Individual Therapy  Rich BraveLanier, Illianna Paschal Chappell 05/06/2016, 12:05 PM

## 2016-05-06 NOTE — Progress Notes (Signed)
Occupational Therapy Note  Patient Details  Name: Luis [name] MRN: [id number] Date of Birth: 19-Dec-1995  Today's Date: 05/06/2016 OT Individual Time: 1400-1430 OT Individual Time Calculation (min): 30 min   Pt denied pain Individual therapy  Pt engaged in w/c mobility tasks/activities.  Pt propelled to kitchen and was challenged with removing/replacing items in refrigerator and microwave.  Educated pt on kitchen safety at w/c level.  Recommended that pt not use stove/oven but suggested that he could assist with tasks in kitchen.  Pt demonstrated self boosting techniques appropriately.  Pt issued a timer to use as a reminder.  Educated pt on importance of boosting to prevent skin breakdown.  Pt maneuvered an obstacle course and retrieved cones from floor.  Pt remained in w/c in gym, awaiting next therapy.   Lavone NeriLanier, Dniyah Grant [facility] Geropsych UnitChappell 05/06/2016, 2:37 PM

## 2016-05-06 NOTE — Progress Notes (Addendum)
Physical Therapy Session Note  Patient Details  Name: Luis [name] MRN: [id number] Date of Birth: 07-07-1995  Today's Date: 05/06/2016 PT Individual Time: 1330-1400    PT Individual Time Calculation (min): 30 min  Session 1: Pt up in w/c upon arrival. Able to propel to gym f/b slide board trasfer from bed<>mat table (level). Working on trunk stability in sitting with/without UE support. Pt able to maintain static sitting X72 seconds without UE support. Incorporating reaching and midline crossing. Also including work on supine<>long sitting bilaterally. Increased difficulty with going toward Lt side. From long sitting working on Lt side press up to sitting. Pt propelling w/c back to room. Left in w/c with mother and call light. Pt without complaint.   Session 2: Pt in bed upon arrival. Transfer performed from bed to w/c with min guard assist. Propelling self to small gym. Performing w/c to mat table (uneven transfer) X3. Mother present and supportive throughout. Pt propelling back to room. Nursing reports pt has been cleared for removal of cervical brace. Removed during session and pt denies pain with active cervical ROM.   Short Term Goals: Week 1:  PT Short Term Goal 1 (Week 1): Pt will perform slide board transfers with mod assist +1. PT Short Term Goal 1 - Progress (Week 1): Progressing toward goal PT Short Term Goal 2 (Week 1): Pt will demonstrate static sitting balance with min assist. PT Short Term Goal 2 - Progress (Week 1): Met PT Short Term Goal 3 (Week 1): Pt will propel w/c 75 ft with min assist. PT Short Term Goal 3 - Progress (Week 1): Met  Skilled Therapeutic Interventions/Progress Updates:      Therapy Documentation Precautions:  Precautions Precautions: Fall, Back, Cervical Precaution Comments: BLE Ace wraps & SCD's, LUE 90 degrees or less Restrictions Weight Bearing Restrictions: Yes LUE Weight Bearing: Weight bearing as tolerated Other Position/Activity  Restrictions: no shoulder ROM above 90 degrees General:   Vital Signs: Therapy Vitals Temp: 99.5 F (37.5 C) Temp Source: Oral Pulse Rate: 97 Resp: 17 BP: (!) 115/53 Patient Position (if appropriate): Sitting Oxygen Therapy SpO2: 100 % O2 Device: Not Delivered Pain: Denies pain   See Function Navigator for Current Functional Status.   Therapy/Group: Individual Therapy  Cassell Clement, PT, CSCS Pager [phone] Office [phone]  05/06/2016, 4:47 PM
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
## 2016-05-06 NOTE — Progress Notes (Signed)
Spoke with neurosurgery in regards to flexion extension cervical films. No fracture identified and advised to discontinue cervical collar

## 2016-05-06 NOTE — Plan of Care (Signed)
Problem: RH SAFETY Goal: RH STG ADHERE TO SAFETY PRECAUTIONS W/ASSISTANCE/DEVICE STG Adhere to Safety Precautions With min Assistance/Device.   Outcome: Progressing Uses the call light appropriately for aid and assistance

## 2016-05-07 ENCOUNTER — Inpatient Hospital Stay (HOSPITAL_COMMUNITY): Payer: Self-pay | Admitting: Physical Therapy

## 2016-05-07 DIAGNOSIS — R52 Pain, unspecified: Secondary | ICD-10-CM

## 2016-05-07 DIAGNOSIS — D62 Acute posthemorrhagic anemia: Secondary | ICD-10-CM

## 2016-05-07 DIAGNOSIS — T148XXA Other injury of unspecified body region, initial encounter: Secondary | ICD-10-CM

## 2016-05-07 DIAGNOSIS — D72829 Elevated white blood cell count, unspecified: Secondary | ICD-10-CM

## 2016-05-07 DIAGNOSIS — G8918 Other acute postprocedural pain: Secondary | ICD-10-CM
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
## 2016-05-07 NOTE — Progress Notes (Signed)
Physical Therapy Session Note  Patient Details  Name: Luis [name] MRN: [id number] Date of Birth: [birth year]  Today's Date: 05/07/2016 PT Individual Time: 0800-0843 PT Individual Time Calculation (min): 43 min   Skilled Therapeutic Interventions/Progress Updates:    Pt found in room today up seated in wheelchair with father in room.  Pt denies pain pre/post session.  Session today worked to address independence with seated balance, transfers, and donning/doffing shoes and leg lifters as well as overall endurance with w/c propulsion.  Per nursing, pt no longer requires cervical collar.  Pt requires cueing to remember importance of abdominal binder and TED hose.  Pt continues to require assist to utilize sliding board for transfers and v/c for L UE precautions otherwise does well with with supervision with donning/doffing shoes and left lifters.  Therapy Documentation Precautions:  Precautions Precautions: Fall, Back, Cervical Precaution Comments: BLE Ace wraps & SCD's, LUE 90 degrees or less Required Braces or Orthoses: Cervical Brace Cervical Brace: Hard collar, At all times Restrictions Weight Bearing Restrictions: No LUE Weight Bearing: Weight bearing as tolerated Other Position/Activity Restrictions: no shoulder ROM above 90 degrees General:   Pain Assessment Pain Score: 0-No pain   See Function Navigator for Current Functional Status.   Therapy/Group: Individual Therapy  Rin Gorton Elveria RisingM Brandyce Dimario 05/07/2016, 9:19 AM

## 2016-05-08 ENCOUNTER — Inpatient Hospital Stay (HOSPITAL_COMMUNITY): Payer: Medicaid Other | Admitting: Physical Therapy

## 2016-05-08 DIAGNOSIS — S24109S Unspecified injury at unspecified level of thoracic spinal cord, sequela: Secondary | ICD-10-CM
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
## 2016-05-08 NOTE — Progress Notes (Signed)
Physical Therapy Session Note  Patient Details  Name: Luis [name] MRN: [id number] Date of Birth: Aug 14, 1995  Today's Date: 05/08/2016 PT Individual Time: [id number] PT Individual Time Calculation (min): 42 min   Short Term Goals: Week 3:  PT Short Term Goal 1 (Week 3): STG = LTG due to estimated d/c date.  Skilled Therapeutic Interventions/Progress Updates:    Pt received in w/c & agreeable to tx. Pt denied c/o pain. Session focused on ramp negotiation and bed mobility. Pt negotiated ramp in ortho gym multiple times requiring cuing for anterior weight shift and min assist for ramp negotiation; pt with insufficient anterior weight shift to help shift COG and would benefit from further practice with task. Pt reports bed height is 24" but they plan to remove wheels which will make bed ~22 inches. Reinforced need for pt/family to remove wheels from bed for increased safety. Pt completed slide board transfer w/c<>mat table set at 22 inches to simulate bed at home; pt required supervision for transfer but was able to direct therapist in his care. Pt transferred supine<>sitting and rolled L/R with use of leg loops and supervision. Educated pt on need to reposition every 2 hours while in bed; provided pt with turning schedule. Pt unable to recall back precautions with therapist re-educating him on all precautions. At end of session pt left sitting in w/c in room with father present to supervise.  At request of therapist RN provided pt with standard hospital bed; reinforced to pt, father, and RN, pt's need to reposition and direct others in helping him to prepare for d/c home.   Therapy Documentation Precautions:  Precautions Precautions: Fall, Back, Cervical Precaution Comments: adominal binder, ted hose Restrictions Weight Bearing Restrictions: Yes LUE Weight Bearing: Weight bearing as tolerated Other Position/Activity Restrictions: shoulder ROM <90 degrees  See Function Navigator for  Current Functional Status.   Therapy/Group: Individual Therapy  Sandi MariscalVictoria M Miller 05/08/2016, 10:37 AM

## 2016-05-08 NOTE — Progress Notes (Signed)
05/08/16 1827 nursing Patient due for PVR ; patient eating at this time; request to do it later. Will report to incoming shift.

## 2016-05-09 ENCOUNTER — Inpatient Hospital Stay (HOSPITAL_COMMUNITY): Payer: Medicaid Other | Admitting: Physical Therapy

## 2016-05-09 ENCOUNTER — Inpatient Hospital Stay (HOSPITAL_COMMUNITY): Payer: Self-pay | Admitting: Occupational Therapy

## 2016-05-09 ENCOUNTER — Inpatient Hospital Stay (HOSPITAL_COMMUNITY): Payer: Self-pay

## 2016-05-09 LAB — CBC WITH DIFFERENTIAL/PLATELET
BASOS PCT: 0 %
Basophils Absolute: 0 10*3/uL (ref 0.0–0.1)
EOS ABS: 0.8 10*3/uL — AB (ref 0.0–0.7)
Eosinophils Relative: 9 %
HEMATOCRIT: 36.6 % — AB (ref 39.0–52.0)
Hemoglobin: 11.7 g/dL — ABNORMAL LOW (ref 13.0–17.0)
Lymphocytes Relative: 30 %
Lymphs Abs: 2.6 10*3/uL (ref 0.7–4.0)
MCH: 27.1 pg (ref 26.0–34.0)
MCHC: 32 g/dL (ref 30.0–36.0)
MCV: 84.9 fL (ref 78.0–100.0)
MONO ABS: 0.9 10*3/uL (ref 0.1–1.0)
MONOS PCT: 10 %
Neutro Abs: 4.5 10*3/uL (ref 1.7–7.7)
Neutrophils Relative %: 51 %
Platelets: 309 10*3/uL (ref 150–400)
RBC: 4.31 MIL/uL (ref 4.22–5.81)
RDW: 13.8 % (ref 11.5–15.5)
WBC: 8.7 10*3/uL (ref 4.0–10.5)

## 2016-05-09 LAB — BASIC METABOLIC PANEL
Anion gap: 8 (ref 5–15)
BUN: 12 mg/dL (ref 6–20)
CALCIUM: 9.8 mg/dL (ref 8.9–10.3)
CO2: 29 mmol/L (ref 22–32)
CREATININE: 0.46 mg/dL — AB (ref 0.61–1.24)
Chloride: 99 mmol/L — ABNORMAL LOW (ref 101–111)
GFR calc Af Amer: >60 mL/min (ref >60)
GFR calc non Af Amer: >60 mL/min (ref >60)
GLUCOSE: 74 mg/dL (ref 65–99)
Potassium: 3.8 mmol/L (ref 3.5–5.1)
Sodium: 136 mmol/L (ref 135–145)

## 2016-05-09 NOTE — Progress Notes (Signed)
Occupational Therapy Session Note  Patient Details  Name: Luis [name] MRN: [id number] Date of Birth: 05-25-1995  Today's Date: 05/09/2016 OT Individual Time: 1300-1415 OT Individual Time Calculation (min): 75 min    Skilled Therapeutic Interventions/Progress Updates: Pt was lying in bed at time of arrival with sister Doree FudgeLuz present, agreeable to session. Tx focus on family education, d/c planning, sitting balance, and functional w/c mobility. PA consulted with regarding pts back/cervical precautions. Precautions clarified with Doree FudgeLuz and pt with verbalized understanding. Pt then reported that brief was soiled, rolled R<L with supervision, able to facilitate roll with use of leg loops. Pt required Min A for hygiene thoroughness. He was able to lower/lift pants without physical assistance. Supine<sit completed with supervision. Doree FudgeLuz was provided hands on practice with slideboard transfer to w/c. W/c for home used today for ease of transition. Min cues from OT for safe technique. Pt then propelled to dayroom, completed slideboard transfer to mat with supervision for min set up cues. [facility] and psychosocial wellness. He reports that he wants to write his story for CIR wall and began writing it today. During this time, Doree FudgeLuz had multiple questions regarding d/c, DME, and access to medical records. PA consulted with as needed with Doree FudgeLuz provided with handout from OT answering these questions. Pt then transferred back to w/c in manner as written above, practice ramp negotiation in new w/c with pt able to ascend/descend ramp in ortho gym without chair tipping and close supervision. At end of tx pt propelled back to room and was left with all needs within reach.   Therapy Documentation Precautions:  Precautions Precautions: Fall, Back, Cervical Precaution Comments: adominal binder, ted hose Required Braces or Orthoses: Cervical  Brace Cervical Brace: Hard collar, At all times Restrictions Weight Bearing Restrictions: Yes LUE Weight Bearing: Weight bearing as tolerated Other Position/Activity Restrictions: shoulder ROM <90 degrees  Pain: No c/o pain during session    ADL: ADL ADL Comments: see Functional Assessment     See Function Navigator for Current Functional Status.   Therapy/Group: Individual Therapy  Gennavieve Huq A Reanne Nellums 05/09/2016, 3:56 PM

## 2016-05-09 NOTE — Progress Notes (Signed)
Physical Therapy Session Note  Patient Details  Name: Luis [name] MRN: [id number] Date of Birth: May 15, 1995  Today's Date: 05/09/2016 PT Individual Time: [id number] and [id number] PT Individual Time Calculation (min): 57 min and 54 min  Short Term Goals: Week 3:  PT Short Term Goal 1 (Week 3): STG = LTG due to estimated d/c date.  Skilled Therapeutic Interventions/Progress Updates:  Treatment 1: Pt received in w/c & agreeable to tx; pt denied c/o pain. Pt reported he has been repositioning with assistance from father and placing pillows under his sides to help relieve pressure while sleeping in traditional hospital bed. Pt transferred w/c<>tilt table via MaxiMove total assist. Pt tolerated being on tilt table ~30 minutes with activity focusing on upright tolerance and weight bearing through BLE; pt engaged in Wii bowling while on tilt table. Pt's vitals as follows:  Sitting: BP = 115/95 mmHg, HR = 105 bpm 30 degrees: BP = 113/62 mmHg, HR = 104 bpm 50 degrees: BP = 103/64 mmHg, HR = 109 bpm 60 degrees: BP = 104/60 mmHg, HR = 111 bpm 70 degrees: BP = 94/56 mmHg initially; after ~5 minutes: BP = 97/62 mmHg, HR = 106 bpm 80 degrees: BP = 96/63 mmHg, HR = 126 bpm Pt asymptomatic throughout entire time on tilt table. Pt did report "feeling tired, almost exhausted" due to effort of maintaining trunk balance while standing on tilt table without BUE support.  Discussed need to practice real car transfer with pt reporting his sister will be here with car this afternoon; plan to practice transfer in PM. At end of session pt left sitting in w/c in room with all needs within reach.    Treatment 2: Pt received in w/c with sister [name](Luz) present for session. Pt denied c/o pain. Educated pt that he does not have back or cervical precautions per verbalization from [place] Jesusita Oka(Dan). Pt propelled w/c room<>outside at [employer] with mod I. Practiced car transfer in sister's car with sliding board and via  squat pivot with therapist providing demonstration and pt & sister practicing together. Pt completed transfer multiple times with therapist educating pt & Doree FudgeLuz on w/c positioning, safety, sequencing and technique. Pt and sister prefer to perform car transfer via squat pivot, during which pt requires min assist. Pt and sister able to complete transfer multiple times with adequate safety awareness and technique. At end of session pt left sitting in w/c in room with sister present to supervise.   Therapy Documentation Precautions:  Precautions Precautions: Fall Precaution Comments: adominal binder, ted hose Weight Bearing Restrictions: Yes LUE Weight Bearing: Weight bearing as tolerated Other Position/Activity Restrictions: shoulder ROM <90 degrees   See Function Navigator for Current Functional Status.   Therapy/Group: Individual Therapy  Sandi MariscalVictoria M Davaun Quintela 05/09/2016, 4:52 PM

## 2016-05-09 NOTE — Progress Notes (Signed)
Occupational Therapy Session Note  Patient Details  Name: [name] MRN: [id number] Date of Birth: [birth year]  Today's Date: 05/09/2016 OT Individual Time: 0900-1010 OT Individual Time Calculation (min): 70 min    Short Term Goals: Week 3:  OT Short Term Goal 1 (Week 3): STG=LTG secondary to ELOS  Skilled Therapeutic Interventions/Progress Updates:    Pt resting in w/c upon arrival.  Pt completed UB bathing tasks at w/c level before transferring to bed to complete LB bathing and all dressing tasks.  Pt completes all tasks at supervision level but required use of bed rails X 1 2/2 LOB when placing LUE for dressing tasks.  Pt performs all SB transfers with close supervision after setup.  Pt completed grooming tasks at w/c.  Focus on bed mobility, functional transfers, sitting balance, directing care, and safety awareness to increase independence with BADLs.   Therapy Documentation Precautions:  Precautions Precautions: Fall, Back, Cervical Precaution Comments: adominal binder, ted hose Required Braces or Orthoses: Cervical Brace Cervical Brace: Hard collar, At all times Restrictions Weight Bearing Restrictions: Yes LUE Weight Bearing: Weight bearing as tolerated Other Position/Activity Restrictions: shoulder ROM <90 degrees  Pain: Pain Assessment Pain Assessment: No/denies pain Pain Score: 0-No pain  See Function Navigator for Current Functional Status.   Therapy/Group: Individual Therapy  Rich BraveLanier, Herberta Pickron Chappell 05/09/2016, 10:13 AM
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
## 2016-05-10 ENCOUNTER — Inpatient Hospital Stay (HOSPITAL_COMMUNITY): Payer: Self-pay | Admitting: Occupational Therapy

## 2016-05-10 ENCOUNTER — Inpatient Hospital Stay (HOSPITAL_COMMUNITY): Payer: Medicaid Other | Admitting: Physical Therapy

## 2016-05-10 ENCOUNTER — Inpatient Hospital Stay (HOSPITAL_COMMUNITY): Payer: Self-pay
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
## 2016-05-10 NOTE — Consult Note (Addendum)
WOC Nurse wound follow-up consult note Bedside nurse states she assessed pt's sacrum wound prior to him getting up OOB to the wheelchair.  She describes the location as pink and shallow. Foam dressing has been applied to protect and promote healing, pt has air mattress to decrease pressure; continue present plan of care. Cammie Mcgeeawn Eira Alpert MSN, RN, CWOCN, ShelbyWCN-AP, CNS [phone]

## 2016-05-10 NOTE — Progress Notes (Signed)
Pt's staples to back were removed. Incision looks clean and skin is approximated. Incision cleansed and new dressing applied.

## 2016-05-10 NOTE — Progress Notes (Signed)
Physical Therapy Session Note  Patient Details  Name: Luis [name] MRN: [id number] Date of Birth: 22-Mar-1996  Today's Date: 05/10/2016 PT Individual Time: 1003-1103 and 4098-1191 and 4782-9562 PT Individual Time Calculation (min): 60 min and 60 min and 26 min  Short Term Goals: Week 3:  PT Short Term Goal 1 (Week 3): STG = LTG due to estimated d/c date.  Skilled Therapeutic Interventions/Progress Updates:  Treatment 1: Pt received in w/c with sister Luis [name]) present for session. Pt without c/o pain. Session focused on bed mobility on soft mat table and hospital bed with pt, sister, and therapist participating in problem solving for scooting L<>R in bed and repositioning. Mat table & bed set at 23 inches high to simulate bed at home. Pt requires cuing for technique to scoot in bed as well as extra time. Educated pt & sister on need for repositioning in bed every 2 hours, and pressure relief (boosting, lateral leans, pushups) every 30 minutes while sitting in w/c. Pt able to long sit and half-circle sit with supervision to scoot in bed and for donning shoes. At end of session pt left sitting in w/c in room with sister present to supervise. Checked off sister Luis [name]) to assist pt with slide board transfers w/c<>bed.  Pt reports audible popping in low back when transferring sit>supine but denies pain.   Treatment 2: Pt received in w/c & agreeable to tx, denying c/o pain. Pt transferred w/c<>tilt table via [facility] total assist. Pt tolerated standing on tilt table ~35 minutes with task focusing on upright tolerance, core strengthening, and weight bearing through BLE. Pt's vitals as follows:  Supine: BP = 117/59 mmHg, HR 88 bpm 30 degrees: BP = 106/57 mmHg, HR = 102 bpm 40 degrees: BP = 109/60 mmHg, HR = 120 bpm 50 degrees at 0 minutes: BP = 95/61 mmHg, HR = 111 bpm 50 degrees after ~3 minutes: BP = 104/57 mmHg, HR = 118 bpm 60 degrees: BP = 107/59 mmHg, HR = 122 bpm 70 degrees: BP = 110/56  mmHg, HR = 123 bpm 80 degrees at 0 minutes: BP = 104/40 mmHg, HR = 131 bpm 80 degrees after 2 minutes: BP = 61/48 mmHg, HR = 123 bpm Pt returned to 60 degrees: BP = 106/73 mmHg, HR = 115 bpm 70 degrees: BP = 116/65 mmHg, HR = 123 bpm 80 degrees at 0  minutes: BP = 115/61 mmHg, HR = 120 bpm 80 degrees at 5 minutes: BP = 105/70 mmHg, HR = 130 bpm Sitting in w/c at end of session: BP = 117/64 mmHg, HR  = 102 bpm, SpO2 = 99%  Pt participated in ball toss and bicep curls (6 lbs) while standing on tilt table. Educated pt on weight management and activity levels following d/c (after pt inquiry). At end of session pt left sitting in w/c with sister escorting him back to room.   Treatment 3: Pt received in bed & agreeable to tx, without c/o pain. Pt's mother & sister present for session. Pt transferred supine>sitting EOB with supervision, bed rails, HOB elevated and leg loops. Session focused on slide board transfers and w/c mobility. Pt transferred bed>w/c with supervision assist but required steady/min assist for w/c<>low, compliant couch. Pt propelled w/c throughout unit & over multiple ramps, requiring min assist to negotiate ramp in ortho gym and supervision in [name]. Pt limited by impaired balance with anterior weight shift and is unable to adequately transfer COG when negotiating ramp. Educated pt & family on need  to stand behind pt to provide min assist when he's negotiating ramp. Pt propelled w/c over tile & carpeted surface and between narrow obstacles with Mod I. Pt able to retrieve object from floor with extra time & close supervision for balance. At end of session pt left sitting in w/c with family escorting him back to his room. Pt with improved ability to direct others in his care during this session.   Therapy Documentation Precautions:  Precautions Precautions: Fall Precaution Comments: ted hose, abdominal binder Weight Bearing Restrictions: Yes LUE Weight Bearing: Weight bearing  as tolerated Other Position/Activity Restrictions: shoulder ROM <90 degrees   See Function Navigator for Current Functional Status.   Therapy/Group: Individual Therapy  Sandi MariscalVictoria M Juri Dinning 05/10/2016, 5:14 PM

## 2016-05-10 NOTE — Progress Notes (Signed)
Occupational Therapy Session Note  Patient Details  Name: Luis [name] MRN: [id number] Date of Birth: 02/10/1996  Today's Date: 05/10/2016 OT Individual Time: 4098-11911118-1200 OT Individual Time Calculation (min): 42 min    Short Term Goals: Week 3:  OT Short Term Goal 1 (Week 3): STG=LTG secondary to ELOS  Skilled Therapeutic Interventions/Progress Updates:    Treatment session with focus on self-directed care, bed mobility, and unsupported sitting balance.  Pt received seated in w/c reporting incontinent of urine.  Completed transfer to bed with slide board, pt directing care with ability to position legs with use of leg loops and positioned slide board with min cues.  Pt able to wash perineal area and buttocks, therapist donning hospital brief in sidelying. Engaged in discussion regarding incontinence brief recommendations for home with pt and pt's sister.  Completed transfer back to w/c with improved positioning of slide board.  Engaged in ball toss seated on edge of therapy mat with focus on unsupported sitting balance with pt progressing from catch and throw to more volley like tapping the ball with increased challenge to abdominals with sitting balance.  Therapist providing min guard to min assist for sitting balance as challenge increased.  Educated on various activities to engage in at home to continue to address sitting balance.  Therapy Documentation Precautions:  Precautions Precautions: Fall Type of Shoulder Precautions: no shoulder ROM >90 degrees Precaution Comments: ted hose, abdominal binder Required Braces or Orthoses: Cervical Brace Cervical Brace: Hard collar, At all times Restrictions Weight Bearing Restrictions: Yes LUE Weight Bearing: Weight bearing as tolerated Other Position/Activity Restrictions: shoulder ROM <90 degrees Pain:  Pt with no c/o pain  See Function Navigator for Current Functional Status.   Therapy/Group: Individual Therapy  Rosalio LoudHOXIE,  Marilyne Haseley 05/10/2016, 12:07 PM

## 2016-05-10 NOTE — Progress Notes (Signed)
Occupational Therapy Session Note  Patient Details  Name: Luis [name] MRN: [id number] Date of Birth: Jul 11, 1995  Today's Date: 05/10/2016 OT Individual Time: 0800-0930 OT Individual Time Calculation (min): 90 min    Short Term Goals: Week 3:  OT Short Term Goal 1 (Week 3): STG=LTG secondary to ELOS  Skilled Therapeutic Interventions/Progress Updates:    Pt resting in bed upon arrival.  Pt engaged in BADL retraining including bathing at shower level and dressing at bed level. Pt perform rolling shower chair transfer with steady A 2/2 sliding board slips on plastic seat. Pt performed all other transfers with supervision.  Bed rails were removed during bed mobility activities.  Pt is able to sit in long sitting and semicircle sitting to perform all LB dressing tasks. Pt directs care givers for assistance appropriately.  Pt returned to w/c after dressing and completed grooming tasks at sink.  Focus on unsupported sitting balance, functional transfers, ongoing education, and safety awareness to increase independence with BADLs.   Therapy Documentation Precautions:  Precautions Precautions: Fall Type of Shoulder Precautions: no shoulder ROM >90 degrees Precaution Comments: ted hose, abdominal binder Required Braces or Orthoses: Cervical Brace Cervical Brace: Hard collar, At all times Restrictions Weight Bearing Restrictions: Yes LUE Weight Bearing: Weight bearing as tolerated Other Position/Activity Restrictions: shoulder ROM <90 degrees  Pain: Pain Assessment Pain Assessment: No/denies pain Pain Score: 0-No pain  See Function Navigator for Current Functional Status.   Therapy/Group: Individual Therapy  Luis [name], Luis [name] 05/10/2016, 9:45 AM

## 2016-05-11 ENCOUNTER — Inpatient Hospital Stay (HOSPITAL_COMMUNITY): Payer: Medicaid Other | Admitting: Physical Therapy

## 2016-05-11 ENCOUNTER — Inpatient Hospital Stay (HOSPITAL_COMMUNITY): Payer: Self-pay

## 2016-05-11 ENCOUNTER — Inpatient Hospital Stay (HOSPITAL_COMMUNITY): Payer: Medicaid Other

## 2016-05-11 MED ORDER — ALUM & MAG HYDROXIDE-SIMETH 200-200-20 MG/5ML PO SUSP
15.0000 mL | Freq: Four times a day (QID) | ORAL | Status: DC | PRN
  Administered 2016-05-11: 15 mL via ORAL
  Filled 2016-05-11: qty 30

## 2016-05-11 NOTE — Progress Notes (Signed)
Occupational Therapy Session Note  Patient Details  Name: Luis [name] MRN: [id number] Date of Birth: May 18, 1995  Today's Date: 05/11/2016 OT Individual Time: 4098-11910930-1055 OT Individual Time Calculation (min): 85 min    Short Term Goals: Week 3:  OT Short Term Goal 1 (Week 3): STG=LTG secondary to ELOS  Skilled Therapeutic Interventions/Progress Updates:    Pt resting in w/c upon arrival.  Pt completed UB bathing tasks seated in w/c at sink before transferring to bed to complete the remainder of bathing/dressing tasks at bed level.  Pt completed all tasks with bed flat and bed rails lowered.  Pt does currently required assistance with donning brief provided by hospital.  Recommended alternative at home to facilitate increased independence.  Pt stated he was exhausted after bathing/dressing and requested to remain in bed.  Continued discharge planning and ongoing education regarding positioning in bed, boosting in w/c, and skin integrity.  Pt verbalized understanding.  Therapy Documentation Precautions:  Precautions Precautions: Fall Type of Shoulder Precautions: no shoulder ROM >90 degrees Precaution Comments: ted hose, abdominal binder Required Braces or Orthoses: Cervical Brace Cervical Brace: Hard collar, At all times Restrictions Weight Bearing Restrictions: Yes LUE Weight Bearing: Weight bearing as tolerated Other Position/Activity Restrictions: shoulder ROM <90 degrees Pain:  Pt denied pain  See Function Navigator for Current Functional Status.   Therapy/Group: Individual Therapy  Luis [name], Luis [name] 05/11/2016, 10:58 AM

## 2016-05-11 NOTE — Progress Notes (Addendum)
Physical Therapy Session Note  Patient Details  Name: Luis [name] MRN: [id number] Date of Birth: [birth year]  Today's Date: 05/11/2016 PT Individual Time: 4098-11910807-0915 and [id number] PT Individual Time Calculation (min): 68 min and 45 min  Short Term Goals: Week 3:  PT Short Term Goal 1 (Week 3): STG = LTG due to estimated d/c date.  Skilled Therapeutic Interventions/Progress Updates:    Treatment 1: Pt received in w/c & agreeable to tx. Pt denied c/o pain & pt's sister, Luis [name], present for session. Session focused on transfers, w/c mobility, sitting balance and core strengthening. Pt completed slide board transfers w/c<>mat table and w/c<>low, compliant couch with supervision assistance. Pt with improved balance & ability to transfer couch>elevated w/c. While sitting on EOM pt participated in tasks focusing on anterior weight shifting with 1 UE support and close supervision and sit-ups from wedge with 1 UE support and mod assist. Pt propelled w/c unit<>gift shop, through small spaces in gift shop, and [place] with [place]. At end of session pt left sitting in w/c in room with sister present to supervise. Educated pt/sister on recommendation of OPPT f/u after d/c.  Treatment 2: Pt received in bed & agreeable to tx, denying c/o pain. Pt's sister Luis [name] not present so unable to practice real car transfer; pt reports she will be here tomorrow. Pt's mother & other sister Luis Rakes(Aida) present to observe session. Pt transferred OOB with supervision & leg loops. Pt completed slide board transfers bed>w/c and w/c<>mat table with supervision. Pt tolerated sitting on EOM x 30 minutes while engaging in wii bowling and tennis with activity focusing on sitting balance & core strengthening. Task progressed from BLE support & no UE support to neither BLE or BUE support. At end of session pt left sitting in w/c with mother & sister escorting him back to his room.  Therapy Documentation Precautions:   Precautions Precautions: Fall Precaution Comments: ted hose, abdominal binder Restrictions Weight Bearing Restrictions: Yes LUE Weight Bearing: Weight bearing as tolerated Other Position/Activity Restrictions: shoulder ROM <90 degrees   See Function Navigator for Current Functional Status.   Therapy/Group: Individual Therapy  Sandi MariscalVictoria M Miller 05/11/2016, 5:44 PM

## 2016-05-11 NOTE — Progress Notes (Signed)
Pt. Has been vomiting after breakfast this morning.Zofran PO was given.Deatra Inaan Angiulli PAC has been notified.Keep monitoring pt. Closely.
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
## 2016-05-11 NOTE — Progress Notes (Signed)
Occupational Therapy Note  Patient Details  Name: Luis [name] MRN: [id number] Date of Birth: Dec 26, 1995  Today's Date: 05/11/2016 OT Missed Time: 60 Minutes Missed Time Reason: Other (comment) (N/V-PA ordered KUB)  Pt missed 60 mins skilled OT services.  Pt c/o n/v.  PA attending and ordered KUB.    Lavone NeriLanier, Camron Essman [facility] HospitalChappell 05/11/2016, 1:51 PM
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
## 2016-05-12 ENCOUNTER — Inpatient Hospital Stay (HOSPITAL_COMMUNITY): Payer: Medicaid Other | Admitting: Physical Therapy

## 2016-05-12 ENCOUNTER — Inpatient Hospital Stay (HOSPITAL_COMMUNITY): Payer: Medicaid Other | Admitting: Occupational Therapy

## 2016-05-12 ENCOUNTER — Inpatient Hospital Stay (HOSPITAL_COMMUNITY): Payer: Self-pay

## 2016-05-12 NOTE — Progress Notes (Signed)
Occupational Therapy Session Note  Patient Details  Name: Luis [name] MRN: [id number] Date of Birth: 09-04-1995  Today's Date: 05/12/2016 OT Individual Time: [id number] OT Individual Time Calculation (min): 54 min    Short Term Goals: Week 3:  OT Short Term Goal 1 (Week 3): STG=LTG secondary to ELOS  Skilled Therapeutic Interventions/Progress Updates:    Upon entering the room, pt supine in bed with sister present in room. Pt with no c/o pain this session. Skilled OT intervention with focus on functional transfers, pt/family education, and community mobility task. Pt in long sitting and donning B shoes by circle sitting and set up A to obtain items. Pt performed slide board transfer from bed >wheelchair with supervision for safety. Pt placed board himself correctly. Pt propelled wheelchair to coffee cafe downstairs and managed wheelchair on/off elevators, doors, chairs, and crowds of people for task. Pt ordered and paid for drink while also utilizing seat belt to help secure drink in lap on way back to room. OT educating pt and caregiver on discharge recommendations and expectations. All questions answered and pt returned to bed in same manner as above. Call bell and all needed items within reach upon exiting the room.   Therapy Documentation Precautions:  Precautions Precautions: Fall Type of Shoulder Precautions: no shoulder ROM >90 degrees Precaution Comments: ted hose, abdominal binder Required Braces or Orthoses: Cervical Brace Cervical Brace: Hard collar, At all times Restrictions Weight Bearing Restrictions: Yes LUE Weight Bearing: Weight bearing as tolerated Other Position/Activity Restrictions: shoulder ROM <90 degrees General:   Vital Signs: Therapy Vitals Temp: 98 F (36.7 C) Temp Source: Oral Pulse Rate: 100 Resp: 17 BP: 109/60 Patient Position (if appropriate): Lying Oxygen Therapy SpO2: 99 % O2 Device: Not Delivered Pain:   ADL: ADL ADL Comments:  see Functional Assessment Exercises:   Other Treatments:    See Function Navigator for Current Functional Status.   Therapy/Group: Individual Therapy  Alen BleacherBradsher, Rishika Mccollom P 05/12/2016, 8:58 AM

## 2016-05-12 NOTE — Progress Notes (Signed)
Occupational Therapy Session Note  Patient Details  Name: Luis [name] MRN: [id number] Date of Birth: [birth year]  Today's Date: 05/12/2016 OT Individual Time: [id number] OT Individual Time Calculation (min): 88 min    Short Term Goals: Week 3:  OT Short Term Goal 1 (Week 3): STG=LTG secondary to ELOS  Skilled Therapeutic Interventions/Progress Updates:    Pt resting in bed upon arrival.  Pt doffed clothing in preparation for SBT to roll in shower chair.  Pt transferred to Roll In Best BuyShower Chair with close supervision.  Pt completed bathing tasks, transfers back to bed, and all dressing tasks at supervision level.  Pt directs care appropriately.  Continued discharge planning and ongoing education regarding positioning.  Pt pleased with progress and ready for discharge tomorrow.   Therapy Documentation Precautions:  Precautions Precautions: Fall Type of Shoulder Precautions: no shoulder ROM >90 degrees Precaution Comments: ted hose, abdominal binder Required Braces or Orthoses: Cervical Brace Cervical Brace: Hard collar, At all times Restrictions Weight Bearing Restrictions: Yes LUE Weight Bearing: Weight bearing as tolerated Other Position/Activity Restrictions: shoulder ROM <90 degrees Pain:  Pt denied pain ADL: ADL ADL Comments: see Functional Assessment  See Function Navigator for Current Functional Status.   Therapy/Group: Individual Therapy  Rich BraveLanier, Tiki Tucciarone Chappell 05/12/2016, 11:02 AM

## 2016-05-12 NOTE — Progress Notes (Signed)
Education was done with pt. And his sister about in and out catherizacion.The clean and sterile technique was explained and demonstrated to the pt. And family.Pt. Was able to performed self catherizacion for the first time successfully.Reading material about the in and out cath and neurogenic bladder were provided by the RN as well.Keep monitoring pt. And family for education and teaching reinforcement needs.

## 2016-05-12 NOTE — Progress Notes (Signed)
Social Work Patient ID: Luis [name], male   DOB: Aug 02, 1995, 21 y.o.   MRN: [id number]   Met with pt and sister to review team conference and discuss d/c referrals being made.  They report feeling they will be ready for d/c tomorrow and agreeable with OP therapies - referred to [facility].  Both deny any concerns at this time.  Jacquette Canales, LCSW

## 2016-05-12 NOTE — Progress Notes (Signed)
Occupational Therapy Discharge Summary  Patient Details  Name: Luis [name] MRN: [id number] Date of Birth: Dec 30, 1995  Patient has met 11 of 11 long term goals due to improved activity tolerance, improved balance, postural control and ability to compensate for deficits.  Pt made excellent progress with BADLs during this admission.  Pt completes bathing tasks at supervision level in the bed.  Pt completes dressing tasks at bed level with supervision.  Pt requires steady A for SBT to padded bench with cutout for toileting tasks at mod A (pt requires assist with pulling pants over hips). Pt completes all basic transfers at supervision level.  Pt directs care appropriately.  Pt's sister and father have participated in therapies and provided appropriate levels of assistance/supervision. Patient to discharge at overall Supervision level.  Patient's care partner is independent to provide the necessary physical assistance at discharge.      Recommendation:  Patient will benefit from ongoing skilled OT services in outpatient setting to continue to advance functional skills in the area of BADL and iADL.  Equipment: padded tub bench with cutout  Reasons for discharge: treatment goals met  Patient/family agrees with progress made and goals achieved: Yes  OT Discharge ADL ADL ADL Comments: see Functional Assessment Vision/Perception  Vision- History Baseline Vision/History: Wears glasses Wears Glasses: At all times Patient Visual Report: No change from baseline Vision- Assessment Vision Assessment?: No apparent visual deficits  Cognition Overall Cognitive Status: Within Functional Limits for tasks assessed Arousal/Alertness: Awake/alert Orientation Level: Oriented X4 Attention: Selective Selective Attention: Appears intact Memory: Appears intact Awareness: Appears intact Problem Solving: Appears intact [employer] Scales of Cognitive Functioning:  Purposeful/appropriate Sensation Sensation Light Touch: Impaired by gross assessment (BLE) Stereognosis: Appears Intact Hot/Cold: Appears Intact Proprioception: Appears Intact Additional Comments: WFL BUE Coordination Gross Motor Movements are Fluid and Coordinated: Yes Fine Motor Movements are Fluid and Coordinated: Yes Motor  Motor Motor: Paraplegia;Abnormal tone    Trunk/Postural Assessment  Cervical AROM Overall Cervical AROM: Within functional limits for tasks performed  Balance Dynamic Sitting Balance Sitting balance - Comments: supervision Extremity/Trunk Assessment RUE Assessment RUE Assessment: Within Functional Limits LUE Assessment LUE Assessment: Exceptions to WFL (AROM <90 per precautions)   See Function Navigator for Current Functional Status.  Leotis Shames [facility] 05/12/2016, 6:46 AM
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
## 2016-05-12 NOTE — Progress Notes (Signed)
Physical Therapy Discharge Summary  Patient Details  Name: Luis [name] MRN: [id number] Date of Birth: 26-Jan-1996  Today's Date: 05/12/2016 PT Individual Time: 1130-1155 and 1311-1410 PT Individual Time Calculation (min): 25 min and 59 minutes  Time 1: Pt performed w/c mobility with mod I in home and controlled environments.  Transfers with sliding board and bed mobility with supervision with leg loops.  sittting balance in long sitting and circle sitting without UE support with supervision for reaching tasks. Discussed importance of continued stretching and strengthening exercises at home.  Time 2:  Pt performed w/c mobility in home, controlled and community environments including ramp control with mod I.  Pt/sister performed real car transfer with squat pivot transfer with sister providing min A. Pt/sister with good understanding of set up and proper mechanics for transfer.  Pt's sister educated on set up/breakdown of loaner w/c and demo'd ability to break down and reset chair for getting it into the car.  Pt/sister educated on importance of pressure relief and continued stretching and strengthening at home.  Patient has met 9 of 9 long term goals due to improved activity tolerance, improved balance, improved postural control, increased strength, increased range of motion, decreased pain and ability to compensate for deficits.  Patient to discharge at a wheelchair level Modified Independent.   Patient's care partner is independent to provide the necessary physical assistance at discharge.  Reasons goals not met: n/a  Recommendation:  Patient will benefit from ongoing skilled PT services in outpatient setting to continue to advance safe functional mobility, address ongoing impairments in balance, strength, activity tolerance, and minimize fall risk.  Equipment: w/c, sliding board  Reasons for discharge: treatment goals met and discharge from hospital  Patient/family agrees with  progress made and goals achieved: Yes  PT Discharge Precautions/Restrictions Precautions Precautions: Fall Type of Shoulder Precautions: no shoulder ROM >90 degrees Restrictions LUE Weight Bearing: Weight bearing as tolerated Pain Pain Assessment Pain Assessment: No/denies pain  Cognition Overall Cognitive Status: Within Functional Limits for tasks assessed Arousal/Alertness: Awake/alert Orientation Level: Oriented X4 Sensation Sensation Light Touch: Impaired Detail Light Touch Impaired Details: Impaired LLE;Impaired RLE Proprioception: Impaired Detail Proprioception Impaired Details: Impaired LLE;Impaired RLE Coordination Gross Motor Movements are Fluid and Coordinated: Yes Fine Motor Movements are Fluid and Coordinated: Yes Motor  Motor Motor: Paraplegia;Abnormal tone   Trunk/Postural Assessment  Cervical AROM Overall Cervical AROM: Within functional limits for tasks performed Thoracic Assessment Thoracic Assessment:  (healing fractures) Lumbar Assessment Lumbar Assessment:  (posterior pelvic tilt) Postural Control Postural Limitations: improving balance  Balance Static Sitting Balance Static Sitting - Balance Support: Right upper extremity supported Static Sitting - Level of Assistance: 5: Stand by assistance Dynamic Sitting Balance Sitting balance - Comments: supervision Extremity Assessment      RLE Assessment RLE Assessment:  (0/5) RLE PROM (degrees) Overall PROM Right Lower Extremity: Within functional limits for tasks assessed LLE Assessment LLE Assessment:  (0/5) LLE PROM (degrees) Overall PROM Left Lower Extremity: Within functional limits for tasks assessed   See Function Navigator for Current Functional Status.  Luis [name] 05/12/2016, 11:57 AM

## 2016-05-12 NOTE — Discharge Summary (Signed)
Discharge summary job # [id number]

## 2016-05-12 NOTE — Plan of Care (Signed)
Problem: RH Other (Specify) Goal: RH LTG Other (Specify)1 Outcome: Not Met (add Reason) Pt will not be able to acces tub/shower at home

## 2016-05-12 NOTE — Discharge Instructions (Signed)
Inpatient Rehab Discharge Instructions  Luis [name] Discharge date and time: No discharge date for patient encounter.   Activities/Precautions/ Functional Status: Activity: activity as tolerated Diet: regular diet Wound Care: keep wound clean and dry Functional status:  ___ No restrictions     ___ Walk up steps independently ___ 24/7 supervision/assistance   ___ Walk up steps with assistance ___ Intermittent supervision/assistance  ___ Bathe/dress independently ___ Walk with walker     _x__ Bathe/dress with assistance ___ Walk Independently    ___ Shower independently ___ Walk with assistance    ___ Shower with assistance ___ No alcohol     ___ Return to work/school ________     COMMUNITY REFERRALS UPON DISCHARGE:    Outpatient: PT    OT                  Agency:  [facility] Phone: [phone]             Appointment Date/Time:  05/19/16 @ 9:30 am (please arrive at 9:00am)  Medical Equipment/Items Ordered: wheelchair, cushion, padded tub bench, transfer board                                                     Agency/Supplier:  [facility] @ [phone]     GENERAL COMMUNITY RESOURCES FOR PATIENT/FAMILY:  Please access your Paralysis Resource Guide for support services available     Special Instructions: Continue Lovenox 40 mg daily until 06/11/2016.  Foam dressing to sacrum and buttocks change every 3 days as needed   My questions have been answered and I understand these instructions. I will adhere to these goals and the provided educational materials after my discharge from the hospital.  Patient/Caregiver Signature _______________________________ Date __________  Clinician Signature _______________________________________ Date __________  Please bring this form and your medication list with you to all your follow-up doctor's appointments.

## 2016-05-13 DIAGNOSIS — R269 Unspecified abnormalities of gait and mobility: Secondary | ICD-10-CM | POA: Diagnosis not present

## 2016-05-13 MED ORDER — ENOXAPARIN SODIUM 40 MG/0.4ML ~~LOC~~ SOLN: SUBCUTANEOUS | 1 refills | Status: DC

## 2016-05-13 MED ORDER — TRAMADOL HCL 50 MG PO TABS: 100.0000 mg | ORAL_TABLET | Freq: Four times a day (QID) | ORAL | 0 refills | Status: DC

## 2016-05-13 MED ORDER — BISACODYL 10 MG RE SUPP: 10.0000 mg | Freq: Every day | RECTAL | 0 refills | Status: DC

## 2016-05-13 MED ORDER — SENNA 8.6 MG PO TABS: 1.0000 | ORAL_TABLET | Freq: Every day | ORAL | 0 refills | Status: DC

## 2016-05-13 MED ORDER — HYDROCODONE-ACETAMINOPHEN 7.5-325 MG/15ML PO SOLN: 10.0000 mL | ORAL | 0 refills | Status: DC | PRN
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
## 2016-05-13 NOTE — Progress Notes (Signed)
Pt. Got d/c papers and equipment,extra supplies for in and out cath.Pt. Ready to go home with family.

## 2016-05-13 NOTE — Plan of Care (Signed)
Problem: SCI BLADDER ELIMINATION Goal: RH STG MANAGE BLADDER WITH EQUIPMENT WITH ASSISTANCE STG Manage Bladder With Equipment With mod Assistance   Pt is successfully able to describe and implement self-catheterization for urinary retention.

## 2016-05-19 ENCOUNTER — Ambulatory Visit: Payer: Medicaid Other | Attending: Physical Medicine & Rehabilitation | Admitting: *Deleted

## 2016-05-19 ENCOUNTER — Ambulatory Visit: Payer: Medicaid Other

## 2016-05-19 ENCOUNTER — Encounter: Payer: Self-pay | Admitting: *Deleted

## 2016-05-19 DIAGNOSIS — R278 Other lack of coordination: Secondary | ICD-10-CM | POA: Diagnosis present

## 2016-05-19 DIAGNOSIS — X58XXXS Exposure to other specified factors, sequela: Secondary | ICD-10-CM | POA: Insufficient documentation

## 2016-05-19 DIAGNOSIS — R293 Abnormal posture: Secondary | ICD-10-CM | POA: Diagnosis present

## 2016-05-19 DIAGNOSIS — S24103S Unspecified injury at T7-T10 level of thoracic spinal cord, sequela: Secondary | ICD-10-CM | POA: Insufficient documentation

## 2016-05-19 DIAGNOSIS — G8221 Paraplegia, complete: Secondary | ICD-10-CM | POA: Diagnosis present

## 2016-05-19 DIAGNOSIS — R2689 Other abnormalities of gait and mobility: Secondary | ICD-10-CM

## 2016-05-19 DIAGNOSIS — G8222 Paraplegia, incomplete: Secondary | ICD-10-CM | POA: Diagnosis not present

## 2016-05-19 DIAGNOSIS — M6281 Muscle weakness (generalized): Secondary | ICD-10-CM

## 2016-05-19 NOTE — Progress Notes (Signed)
Mobility Assessment  Luis [name] was seen today for the purpose of a mobility assessment for a powered wheelchair. I have reviewed and agree with the detailed PT evaluation. he suffers from paraplegia related to spinal cord injury. Due to his paraplegia, he is unable to utilize a cane, walker. The patient is appropriate for a customized ultra-lightweight wheelchair. With an ultra light weight wheelchair, he can move independently at a household level and on a limited basis in the community. The chair will also allow the patient to perform ADL's more easily. The patient is competent to operate the recommended chair on his own and is motivated to utilize the chair on a daily basis.   This evaluation and assessment was performed on 05/12/16.     Ranelle OysterZachary T. Swartz, MD, [facility] Physical Medicine & Rehabilitation
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
## 2016-05-20 NOTE — Progress Notes (Signed)
Social Work  Discharge Note  The overall goal for the admission was met for:   Discharge location: Yes - home with family providing 24/7 assistance  Length of Stay: Yes - 24 days  Discharge activity level: Yes - mod ind - min w/c level  Home/community participation: Yes  Services provided included: MD, RD, PT, OT, SLP, RN, TR, Pharmacy, Neuropsych and SW  Financial Services: Medicaid  Follow-up services arranged: Outpatient: PT,OT @ [facility], DME: specialty w/c and cushion, padded tub bench with cut-out, 24" transfer board via [place] and Patient/Family has no preference for HH/DME agencies  Comments (or additional information):  Patient/Family verbalized understanding of follow-up arrangements: Yes  Individual responsible for coordination of the follow-up plan: pt  Confirmed correct DME delivered: Blessyn [place] 05/20/2016    Primo Innis

## 2016-05-25 DIAGNOSIS — S42022A Displaced fracture of shaft of left clavicle, initial encounter for closed fracture: Secondary | ICD-10-CM | POA: Diagnosis not present

## 2016-05-27 ENCOUNTER — Ambulatory Visit: Payer: Medicaid Other | Admitting: Rehabilitation

## 2016-05-27 ENCOUNTER — Telehealth: Payer: Self-pay | Admitting: General Practice

## 2016-05-27 NOTE — Telephone Encounter (Signed)
Apt. Reminder call,pt has no phone

## 2016-05-30 ENCOUNTER — Encounter: Payer: Self-pay | Admitting: Internal Medicine

## 2016-05-30 ENCOUNTER — Ambulatory Visit (INDEPENDENT_AMBULATORY_CARE_PROVIDER_SITE_OTHER): Payer: Medicaid Other | Admitting: Internal Medicine

## 2016-05-30 VITALS — BP 120/73 | HR 91 | Temp 97.6°F | Ht 67.0 in | Wt 130.0 lb

## 2016-05-30 DIAGNOSIS — G8220 Paraplegia, unspecified: Secondary | ICD-10-CM

## 2016-05-30 DIAGNOSIS — S22068D Other fracture of T7-T8 thoracic vertebra, subsequent encounter for fracture with routine healing: Secondary | ICD-10-CM | POA: Diagnosis not present

## 2016-05-30 DIAGNOSIS — N319 Neuromuscular dysfunction of bladder, unspecified: Secondary | ICD-10-CM | POA: Diagnosis not present

## 2016-05-30 DIAGNOSIS — K592 Neurogenic bowel, not elsewhere classified: Secondary | ICD-10-CM

## 2016-05-30 DIAGNOSIS — Z993 Dependence on wheelchair: Secondary | ICD-10-CM

## 2016-05-30 DIAGNOSIS — S24109S Unspecified injury at unspecified level of thoracic spinal cord, sequela: Secondary | ICD-10-CM

## 2016-05-30 DIAGNOSIS — S42002D Fracture of unspecified part of left clavicle, subsequent encounter for fracture with routine healing: Secondary | ICD-10-CM

## 2016-05-30 DIAGNOSIS — Z8709 Personal history of other diseases of the respiratory system: Secondary | ICD-10-CM | POA: Diagnosis not present

## 2016-05-30 DIAGNOSIS — Z0189 Encounter for other specified special examinations: Secondary | ICD-10-CM

## 2016-05-30 DIAGNOSIS — Z8619 Personal history of other infectious and parasitic diseases: Secondary | ICD-10-CM

## 2016-05-30 MED ORDER — TRAMADOL HCL 50 MG PO TABS: 100.0000 mg | ORAL_TABLET | Freq: Every day | ORAL | 0 refills | Status: DC | PRN

## 2016-05-30 NOTE — Assessment & Plan Note (Signed)
wrote Rx for condom cath. Asked him to not use condom cath every day as it can put him at risk for UTIs.

## 2016-05-30 NOTE — Assessment & Plan Note (Signed)
Patient is paraplegic from T8 spinal injury. Has pain throughout his left shoulder from left clavicle fracture which is being medically managed currently. Has neurogenic bowel and bladder.   - filled tramadol for his overall body pain, takes it once daily. Asked him to taper the use of this and use tylenol as much as possible. - wrote Rx for condom cath. Asked him to not use condom cath every day as it can put him at risk for UTIs.  - f/up in 3 months to make sure he is doing fine and has good support at home. - cont outpatient PT and OT - f/up with Neurosurgery.

## 2016-05-30 NOTE — Patient Instructions (Signed)
Please follow up with your doctors as instructed on your discharge paper.  Take tramadol when you have severe pain, otherwise take tylenol.  Continue physical therapy.  Finish your lovenox course.  Try not to use condom cath everyday, you can use adult diapers.
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
## 2016-05-31 NOTE — Progress Notes (Signed)
Case discussed with Dr. Ahmed at the time of the visit. We reviewed the resident's history and exam and pertinent patient test results. I agree with the assessment, diagnosis, and plan of care documented in the resident's note. 

## 2016-06-01 ENCOUNTER — Encounter: Payer: Self-pay | Admitting: Physical Therapy

## 2016-06-01 ENCOUNTER — Ambulatory Visit: Payer: Medicaid Other | Attending: Physical Medicine & Rehabilitation | Admitting: Physical Therapy

## 2016-06-01 ENCOUNTER — Ambulatory Visit: Payer: Medicaid Other | Admitting: Occupational Therapy

## 2016-06-01 DIAGNOSIS — X58XXXS Exposure to other specified factors, sequela: Secondary | ICD-10-CM | POA: Insufficient documentation

## 2016-06-01 DIAGNOSIS — R52 Pain, unspecified: Secondary | ICD-10-CM | POA: Insufficient documentation

## 2016-06-01 DIAGNOSIS — F4323 Adjustment disorder with mixed anxiety and depressed mood: Secondary | ICD-10-CM | POA: Diagnosis present

## 2016-06-01 DIAGNOSIS — R29898 Other symptoms and signs involving the musculoskeletal system: Secondary | ICD-10-CM | POA: Insufficient documentation

## 2016-06-01 DIAGNOSIS — R2689 Other abnormalities of gait and mobility: Secondary | ICD-10-CM

## 2016-06-01 DIAGNOSIS — M6281 Muscle weakness (generalized): Secondary | ICD-10-CM

## 2016-06-01 DIAGNOSIS — R29818 Other symptoms and signs involving the nervous system: Secondary | ICD-10-CM | POA: Diagnosis present

## 2016-06-01 DIAGNOSIS — R293 Abnormal posture: Secondary | ICD-10-CM

## 2016-06-01 DIAGNOSIS — R278 Other lack of coordination: Secondary | ICD-10-CM

## 2016-06-01 DIAGNOSIS — G8221 Paraplegia, complete: Secondary | ICD-10-CM

## 2016-06-01 DIAGNOSIS — M25612 Stiffness of left shoulder, not elsewhere classified: Secondary | ICD-10-CM | POA: Diagnosis present

## 2016-06-01 DIAGNOSIS — S24109S Unspecified injury at unspecified level of thoracic spinal cord, sequela: Secondary | ICD-10-CM | POA: Insufficient documentation

## 2016-06-01 NOTE — Patient Instructions (Signed)
1. Grip Strengthening (Resistive Putty)   Squeeze putty using thumb and all fingers. Repeat _20___ times. Do __2__ sessions per day.   2. Roll putty into tube on table and pinch between each finger and thumb x 10 reps each. (can do ring and small finger together)     Copyright  VHI. All rights reserved.   
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
## 2016-06-03 ENCOUNTER — Telehealth: Payer: Self-pay | Admitting: *Deleted

## 2016-06-03 NOTE — Telephone Encounter (Signed)
[facility] called asking for labs and clinic notes for this patient.   I compiled records and faxed to provided phone number

## 2016-06-06 ENCOUNTER — Ambulatory Visit: Payer: Medicaid Other | Admitting: Physical Therapy

## 2016-06-07 ENCOUNTER — Ambulatory Visit: Payer: Medicaid Other | Admitting: Occupational Therapy

## 2016-06-08 ENCOUNTER — Encounter: Payer: Self-pay | Admitting: Physical Medicine & Rehabilitation

## 2016-06-08 ENCOUNTER — Encounter: Payer: Medicaid Other | Attending: Physical Medicine & Rehabilitation | Admitting: Physical Medicine & Rehabilitation

## 2016-06-08 VITALS — BP 130/77 | HR 108

## 2016-06-08 DIAGNOSIS — Z930 Tracheostomy status: Secondary | ICD-10-CM | POA: Insufficient documentation

## 2016-06-08 DIAGNOSIS — S27331S Laceration of lung, unilateral, sequela: Secondary | ICD-10-CM | POA: Insufficient documentation

## 2016-06-08 DIAGNOSIS — N319 Neuromuscular dysfunction of bladder, unspecified: Secondary | ICD-10-CM | POA: Insufficient documentation

## 2016-06-08 DIAGNOSIS — Z79891 Long term (current) use of opiate analgesic: Secondary | ICD-10-CM | POA: Insufficient documentation

## 2016-06-08 DIAGNOSIS — S42002S Fracture of unspecified part of left clavicle, sequela: Secondary | ICD-10-CM | POA: Insufficient documentation

## 2016-06-08 DIAGNOSIS — Z981 Arthrodesis status: Secondary | ICD-10-CM | POA: Insufficient documentation

## 2016-06-08 DIAGNOSIS — G8929 Other chronic pain: Secondary | ICD-10-CM | POA: Insufficient documentation

## 2016-06-08 DIAGNOSIS — M549 Dorsalgia, unspecified: Secondary | ICD-10-CM | POA: Insufficient documentation

## 2016-06-08 DIAGNOSIS — G8220 Paraplegia, unspecified: Secondary | ICD-10-CM | POA: Diagnosis not present

## 2016-06-08 DIAGNOSIS — S22068S Other fracture of T7-T8 thoracic vertebra, sequela: Secondary | ICD-10-CM | POA: Diagnosis not present

## 2016-06-08 DIAGNOSIS — S27322S Contusion of lung, bilateral, sequela: Secondary | ICD-10-CM | POA: Insufficient documentation

## 2016-06-08 DIAGNOSIS — S0181XS Laceration without foreign body of other part of head, sequela: Secondary | ICD-10-CM | POA: Insufficient documentation

## 2016-06-08 DIAGNOSIS — S069X9S Unspecified intracranial injury with loss of consciousness of unspecified duration, sequela: Secondary | ICD-10-CM | POA: Diagnosis not present

## 2016-06-08 DIAGNOSIS — S2221XS Fracture of manubrium, sequela: Secondary | ICD-10-CM | POA: Insufficient documentation

## 2016-06-08 DIAGNOSIS — Z9889 Other specified postprocedural states: Secondary | ICD-10-CM | POA: Diagnosis not present

## 2016-06-08 NOTE — Patient Instructions (Signed)
FOCUS ON USING GOOD TECHNIQUE AND FORM WITH YOUR WEIGHT SHIFTS, TRANSFERS, WHEELCHAIR PROPULSION.   TRY REDUCING THE TRAMADOL TO 50MG  IN THE MORNING ONLY. TRY USING TYLENOL INSTEAD
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
## 2016-06-09 ENCOUNTER — Ambulatory Visit: Payer: Medicaid Other | Admitting: Occupational Therapy

## 2016-06-09 ENCOUNTER — Encounter: Payer: Self-pay | Admitting: Occupational Therapy

## 2016-06-09 ENCOUNTER — Ambulatory Visit: Payer: Medicaid Other | Admitting: Physical Therapy

## 2016-06-09 DIAGNOSIS — S24109S Unspecified injury at unspecified level of thoracic spinal cord, sequela: Secondary | ICD-10-CM

## 2016-06-09 DIAGNOSIS — R52 Pain, unspecified: Secondary | ICD-10-CM

## 2016-06-09 DIAGNOSIS — R293 Abnormal posture: Secondary | ICD-10-CM

## 2016-06-09 DIAGNOSIS — M6281 Muscle weakness (generalized): Secondary | ICD-10-CM

## 2016-06-09 DIAGNOSIS — M25612 Stiffness of left shoulder, not elsewhere classified: Secondary | ICD-10-CM

## 2016-06-09 DIAGNOSIS — G8221 Paraplegia, complete: Secondary | ICD-10-CM

## 2016-06-09 DIAGNOSIS — F4323 Adjustment disorder with mixed anxiety and depressed mood: Secondary | ICD-10-CM

## 2016-06-09 DIAGNOSIS — R29898 Other symptoms and signs involving the musculoskeletal system: Secondary | ICD-10-CM

## 2016-06-09 DIAGNOSIS — R29818 Other symptoms and signs involving the nervous system: Secondary | ICD-10-CM
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
## 2016-06-09 NOTE — Patient Instructions (Addendum)
Provided with written and picture instructions for scapular adduction/tricep stretch in supine (pt with LOB when attempted in sitting in wheelchair).   Educated in upper trapezius stretch (also in supine due to poor sitting balance).   Supine scapular adduction, shoulder external rotation in supine with red theraband.   Supine overhead triceps press with 2#   **Instructions from online source

## 2016-06-13 ENCOUNTER — Encounter: Payer: Medicaid Other | Admitting: Occupational Therapy

## 2016-06-14 DIAGNOSIS — N310 Uninhibited neuropathic bladder, not elsewhere classified: Secondary | ICD-10-CM | POA: Diagnosis not present

## 2016-06-16 ENCOUNTER — Encounter: Payer: Self-pay | Admitting: Occupational Therapy

## 2016-06-16 ENCOUNTER — Encounter: Payer: Self-pay | Admitting: Physical Therapy

## 2016-06-16 ENCOUNTER — Ambulatory Visit: Payer: Medicaid Other | Admitting: Occupational Therapy

## 2016-06-16 ENCOUNTER — Ambulatory Visit: Payer: Medicaid Other | Admitting: Physical Therapy

## 2016-06-16 DIAGNOSIS — R29818 Other symptoms and signs involving the nervous system: Secondary | ICD-10-CM

## 2016-06-16 DIAGNOSIS — M6281 Muscle weakness (generalized): Secondary | ICD-10-CM

## 2016-06-16 DIAGNOSIS — G8221 Paraplegia, complete: Secondary | ICD-10-CM | POA: Diagnosis not present

## 2016-06-16 DIAGNOSIS — R29898 Other symptoms and signs involving the musculoskeletal system: Secondary | ICD-10-CM

## 2016-06-16 DIAGNOSIS — R293 Abnormal posture: Secondary | ICD-10-CM

## 2016-06-16 DIAGNOSIS — R278 Other lack of coordination: Secondary | ICD-10-CM

## 2016-06-16 DIAGNOSIS — M25612 Stiffness of left shoulder, not elsewhere classified: Secondary | ICD-10-CM
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
## 2016-06-20 ENCOUNTER — Ambulatory Visit: Payer: Medicaid Other | Admitting: Occupational Therapy

## 2016-06-20 ENCOUNTER — Encounter: Payer: Self-pay | Admitting: Occupational Therapy

## 2016-06-20 ENCOUNTER — Encounter: Payer: Medicaid Other | Admitting: Occupational Therapy

## 2016-06-20 ENCOUNTER — Ambulatory Visit: Payer: Medicaid Other | Admitting: Rehabilitation

## 2016-06-20 DIAGNOSIS — M6281 Muscle weakness (generalized): Secondary | ICD-10-CM

## 2016-06-20 DIAGNOSIS — G8221 Paraplegia, complete: Secondary | ICD-10-CM | POA: Diagnosis not present

## 2016-06-20 DIAGNOSIS — R29818 Other symptoms and signs involving the nervous system: Secondary | ICD-10-CM

## 2016-06-20 DIAGNOSIS — M25612 Stiffness of left shoulder, not elsewhere classified: Secondary | ICD-10-CM

## 2016-06-20 DIAGNOSIS — R293 Abnormal posture: Secondary | ICD-10-CM

## 2016-06-20 DIAGNOSIS — R29898 Other symptoms and signs involving the musculoskeletal system: Secondary | ICD-10-CM
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
## 2016-06-20 NOTE — Patient Instructions (Signed)
Home Exercise Program for your arms: Do these 2 times per day!!!  1. Lay on your back, hold a 5 pound weight in your hands, palms facing each other.  Lift toward ceiling until arms are straight, then keeping arms straight, raise over your head and then back. Do 15, rest then do 15 more.  2. Lay on your back, hold a 2 pound weight in each hand. Raise the weights toward the ceiling, palms facing in until your arms are straight.  Keeping your elbow straight, slowly lower the weight to your side, hold for a slow count of 5 and return to starting position. Do 15, rest then do 15.
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
## 2016-06-23 ENCOUNTER — Ambulatory Visit: Payer: Medicaid Other | Admitting: Physical Therapy

## 2016-06-23 ENCOUNTER — Ambulatory Visit: Payer: Medicaid Other | Admitting: Occupational Therapy

## 2016-06-28 ENCOUNTER — Ambulatory Visit: Payer: Medicaid Other | Attending: Physical Medicine & Rehabilitation | Admitting: Physical Therapy

## 2016-06-28 ENCOUNTER — Encounter: Payer: Self-pay | Admitting: Physical Therapy

## 2016-06-28 ENCOUNTER — Encounter: Payer: Self-pay | Admitting: Occupational Therapy

## 2016-06-28 ENCOUNTER — Ambulatory Visit: Payer: Medicaid Other | Admitting: Occupational Therapy

## 2016-06-28 DIAGNOSIS — R293 Abnormal posture: Secondary | ICD-10-CM

## 2016-06-28 DIAGNOSIS — R29818 Other symptoms and signs involving the nervous system: Secondary | ICD-10-CM

## 2016-06-28 DIAGNOSIS — G8221 Paraplegia, complete: Secondary | ICD-10-CM | POA: Diagnosis not present

## 2016-06-28 DIAGNOSIS — X58XXXS Exposure to other specified factors, sequela: Secondary | ICD-10-CM | POA: Diagnosis not present

## 2016-06-28 DIAGNOSIS — K592 Neurogenic bowel, not elsewhere classified: Secondary | ICD-10-CM | POA: Diagnosis not present

## 2016-06-28 DIAGNOSIS — R29898 Other symptoms and signs involving the musculoskeletal system: Secondary | ICD-10-CM | POA: Diagnosis not present

## 2016-06-28 DIAGNOSIS — F1210 Cannabis abuse, uncomplicated: Secondary | ICD-10-CM | POA: Diagnosis not present

## 2016-06-28 DIAGNOSIS — M25612 Stiffness of left shoulder, not elsewhere classified: Secondary | ICD-10-CM | POA: Insufficient documentation

## 2016-06-28 DIAGNOSIS — S24109S Unspecified injury at unspecified level of thoracic spinal cord, sequela: Secondary | ICD-10-CM | POA: Insufficient documentation

## 2016-06-28 DIAGNOSIS — M6281 Muscle weakness (generalized): Secondary | ICD-10-CM

## 2016-06-28 DIAGNOSIS — R52 Pain, unspecified: Secondary | ICD-10-CM | POA: Diagnosis not present

## 2016-06-28 DIAGNOSIS — N319 Neuromuscular dysfunction of bladder, unspecified: Secondary | ICD-10-CM | POA: Insufficient documentation
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
## 2016-06-30 ENCOUNTER — Ambulatory Visit: Payer: Medicaid Other | Admitting: Occupational Therapy

## 2016-06-30 ENCOUNTER — Ambulatory Visit: Payer: Medicaid Other | Admitting: Physical Therapy

## 2016-06-30 ENCOUNTER — Encounter: Payer: Self-pay | Admitting: Occupational Therapy

## 2016-06-30 ENCOUNTER — Encounter: Payer: Self-pay | Admitting: Physical Therapy

## 2016-06-30 DIAGNOSIS — R29818 Other symptoms and signs involving the nervous system: Secondary | ICD-10-CM

## 2016-06-30 DIAGNOSIS — M6281 Muscle weakness (generalized): Secondary | ICD-10-CM

## 2016-06-30 DIAGNOSIS — G8221 Paraplegia, complete: Secondary | ICD-10-CM

## 2016-06-30 DIAGNOSIS — R293 Abnormal posture: Secondary | ICD-10-CM

## 2016-06-30 DIAGNOSIS — R29898 Other symptoms and signs involving the musculoskeletal system: Secondary | ICD-10-CM

## 2016-06-30 DIAGNOSIS — M25612 Stiffness of left shoulder, not elsewhere classified: Secondary | ICD-10-CM
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
## 2016-07-01 ENCOUNTER — Telehealth: Payer: Self-pay | Admitting: Physical Medicine & Rehabilitation

## 2016-07-01 DIAGNOSIS — F329 Major depressive disorder, single episode, unspecified: Secondary | ICD-10-CM
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
## 2016-07-04 ENCOUNTER — Ambulatory Visit: Payer: Medicaid Other | Admitting: Rehabilitation

## 2016-07-04 ENCOUNTER — Encounter: Payer: Self-pay | Admitting: Rehabilitation

## 2016-07-04 ENCOUNTER — Encounter: Payer: Self-pay | Admitting: Occupational Therapy

## 2016-07-04 ENCOUNTER — Ambulatory Visit: Payer: Medicaid Other | Admitting: Occupational Therapy

## 2016-07-04 DIAGNOSIS — S24109S Unspecified injury at unspecified level of thoracic spinal cord, sequela: Secondary | ICD-10-CM

## 2016-07-04 DIAGNOSIS — M6281 Muscle weakness (generalized): Secondary | ICD-10-CM

## 2016-07-04 DIAGNOSIS — R293 Abnormal posture: Secondary | ICD-10-CM

## 2016-07-04 DIAGNOSIS — R29818 Other symptoms and signs involving the nervous system: Secondary | ICD-10-CM

## 2016-07-04 DIAGNOSIS — G8221 Paraplegia, complete: Secondary | ICD-10-CM

## 2016-07-04 DIAGNOSIS — R29898 Other symptoms and signs involving the musculoskeletal system: Secondary | ICD-10-CM

## 2016-07-04 DIAGNOSIS — R52 Pain, unspecified: Secondary | ICD-10-CM

## 2016-07-04 DIAGNOSIS — M25612 Stiffness of left shoulder, not elsewhere classified: Secondary | ICD-10-CM
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
## 2016-07-07 ENCOUNTER — Ambulatory Visit: Payer: Medicaid Other | Admitting: Physical Therapy

## 2016-07-07 ENCOUNTER — Encounter: Payer: Self-pay | Admitting: Occupational Therapy

## 2016-07-07 ENCOUNTER — Encounter: Payer: Self-pay | Admitting: Physical Therapy

## 2016-07-07 ENCOUNTER — Ambulatory Visit: Payer: Medicaid Other | Admitting: Occupational Therapy

## 2016-07-07 DIAGNOSIS — M6281 Muscle weakness (generalized): Secondary | ICD-10-CM | POA: Diagnosis not present

## 2016-07-07 DIAGNOSIS — M25612 Stiffness of left shoulder, not elsewhere classified: Secondary | ICD-10-CM

## 2016-07-07 DIAGNOSIS — R29898 Other symptoms and signs involving the musculoskeletal system: Secondary | ICD-10-CM

## 2016-07-07 DIAGNOSIS — G8221 Paraplegia, complete: Secondary | ICD-10-CM

## 2016-07-07 DIAGNOSIS — R29818 Other symptoms and signs involving the nervous system: Secondary | ICD-10-CM

## 2016-07-07 DIAGNOSIS — R293 Abnormal posture: Secondary | ICD-10-CM
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
## 2016-07-11 ENCOUNTER — Ambulatory Visit: Payer: Medicaid Other | Admitting: Rehabilitation

## 2016-07-11 ENCOUNTER — Ambulatory Visit: Payer: Medicaid Other | Admitting: Occupational Therapy

## 2016-07-13 ENCOUNTER — Ambulatory Visit: Payer: Medicaid Other | Admitting: Occupational Therapy

## 2016-07-13 ENCOUNTER — Ambulatory Visit: Payer: Medicaid Other | Admitting: Physical Therapy

## 2016-07-13 ENCOUNTER — Encounter: Payer: Self-pay | Admitting: Occupational Therapy

## 2016-07-13 ENCOUNTER — Encounter: Payer: Self-pay | Admitting: Physical Therapy

## 2016-07-13 DIAGNOSIS — R293 Abnormal posture: Secondary | ICD-10-CM

## 2016-07-13 DIAGNOSIS — M6281 Muscle weakness (generalized): Secondary | ICD-10-CM

## 2016-07-13 DIAGNOSIS — G8221 Paraplegia, complete: Secondary | ICD-10-CM

## 2016-07-13 DIAGNOSIS — M25612 Stiffness of left shoulder, not elsewhere classified: Secondary | ICD-10-CM

## 2016-07-13 DIAGNOSIS — R29818 Other symptoms and signs involving the nervous system: Secondary | ICD-10-CM

## 2016-07-13 DIAGNOSIS — R29898 Other symptoms and signs involving the musculoskeletal system: Secondary | ICD-10-CM
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
## 2016-07-14 ENCOUNTER — Encounter: Payer: Medicaid Other | Admitting: Occupational Therapy

## 2016-07-15 ENCOUNTER — Ambulatory Visit: Payer: Medicaid Other | Admitting: Occupational Therapy

## 2016-07-15 ENCOUNTER — Encounter: Payer: Self-pay | Admitting: Occupational Therapy

## 2016-07-15 ENCOUNTER — Ambulatory Visit: Payer: Medicaid Other | Admitting: Physical Therapy

## 2016-07-15 DIAGNOSIS — R293 Abnormal posture: Secondary | ICD-10-CM

## 2016-07-15 DIAGNOSIS — M6281 Muscle weakness (generalized): Secondary | ICD-10-CM

## 2016-07-15 DIAGNOSIS — R29818 Other symptoms and signs involving the nervous system: Secondary | ICD-10-CM

## 2016-07-15 DIAGNOSIS — R29898 Other symptoms and signs involving the musculoskeletal system: Secondary | ICD-10-CM

## 2016-07-15 DIAGNOSIS — G8221 Paraplegia, complete: Secondary | ICD-10-CM

## 2016-07-15 DIAGNOSIS — M25612 Stiffness of left shoulder, not elsewhere classified: Secondary | ICD-10-CM
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
## 2016-07-18 ENCOUNTER — Ambulatory Visit: Payer: Medicaid Other | Admitting: Physical Therapy

## 2016-07-18 ENCOUNTER — Encounter: Payer: Self-pay | Admitting: Occupational Therapy

## 2016-07-18 ENCOUNTER — Ambulatory Visit: Payer: Medicaid Other | Admitting: Occupational Therapy

## 2016-07-18 DIAGNOSIS — G8221 Paraplegia, complete: Secondary | ICD-10-CM | POA: Diagnosis not present

## 2016-07-18 DIAGNOSIS — M6281 Muscle weakness (generalized): Secondary | ICD-10-CM | POA: Diagnosis not present

## 2016-07-18 DIAGNOSIS — R29898 Other symptoms and signs involving the musculoskeletal system: Secondary | ICD-10-CM

## 2016-07-18 DIAGNOSIS — S24103A Unspecified injury at T7-T10 level of thoracic spinal cord, initial encounter: Secondary | ICD-10-CM | POA: Diagnosis not present

## 2016-07-18 DIAGNOSIS — M25612 Stiffness of left shoulder, not elsewhere classified: Secondary | ICD-10-CM

## 2016-07-18 DIAGNOSIS — R293 Abnormal posture: Secondary | ICD-10-CM

## 2016-07-18 DIAGNOSIS — R29818 Other symptoms and signs involving the nervous system: Secondary | ICD-10-CM
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
## 2016-07-21 ENCOUNTER — Encounter: Payer: Self-pay | Admitting: Occupational Therapy

## 2016-07-21 ENCOUNTER — Encounter: Payer: Self-pay | Admitting: Rehabilitation

## 2016-07-21 ENCOUNTER — Ambulatory Visit: Payer: Medicaid Other | Admitting: Rehabilitation

## 2016-07-21 ENCOUNTER — Encounter: Payer: Medicaid Other | Attending: Physical Medicine & Rehabilitation | Admitting: Psychology

## 2016-07-21 ENCOUNTER — Ambulatory Visit: Payer: Medicaid Other | Admitting: Occupational Therapy

## 2016-07-21 DIAGNOSIS — R293 Abnormal posture: Secondary | ICD-10-CM

## 2016-07-21 DIAGNOSIS — S27322S Contusion of lung, bilateral, sequela: Secondary | ICD-10-CM | POA: Insufficient documentation

## 2016-07-21 DIAGNOSIS — S22068S Other fracture of T7-T8 thoracic vertebra, sequela: Secondary | ICD-10-CM | POA: Insufficient documentation

## 2016-07-21 DIAGNOSIS — Z930 Tracheostomy status: Secondary | ICD-10-CM | POA: Insufficient documentation

## 2016-07-21 DIAGNOSIS — S069X9S Unspecified intracranial injury with loss of consciousness of unspecified duration, sequela: Secondary | ICD-10-CM | POA: Insufficient documentation

## 2016-07-21 DIAGNOSIS — M6281 Muscle weakness (generalized): Secondary | ICD-10-CM

## 2016-07-21 DIAGNOSIS — M549 Dorsalgia, unspecified: Secondary | ICD-10-CM | POA: Insufficient documentation

## 2016-07-21 DIAGNOSIS — N319 Neuromuscular dysfunction of bladder, unspecified: Secondary | ICD-10-CM | POA: Insufficient documentation

## 2016-07-21 DIAGNOSIS — S0181XS Laceration without foreign body of other part of head, sequela: Secondary | ICD-10-CM | POA: Insufficient documentation

## 2016-07-21 DIAGNOSIS — M25612 Stiffness of left shoulder, not elsewhere classified: Secondary | ICD-10-CM

## 2016-07-21 DIAGNOSIS — R29898 Other symptoms and signs involving the musculoskeletal system: Secondary | ICD-10-CM

## 2016-07-21 DIAGNOSIS — G8220 Paraplegia, unspecified: Secondary | ICD-10-CM | POA: Insufficient documentation

## 2016-07-21 DIAGNOSIS — S42002S Fracture of unspecified part of left clavicle, sequela: Secondary | ICD-10-CM | POA: Insufficient documentation

## 2016-07-21 DIAGNOSIS — G8929 Other chronic pain: Secondary | ICD-10-CM | POA: Insufficient documentation

## 2016-07-21 DIAGNOSIS — S2221XS Fracture of manubrium, sequela: Secondary | ICD-10-CM | POA: Insufficient documentation

## 2016-07-21 DIAGNOSIS — R29818 Other symptoms and signs involving the nervous system: Secondary | ICD-10-CM

## 2016-07-21 DIAGNOSIS — Z981 Arthrodesis status: Secondary | ICD-10-CM | POA: Insufficient documentation

## 2016-07-21 DIAGNOSIS — S27331S Laceration of lung, unilateral, sequela: Secondary | ICD-10-CM | POA: Insufficient documentation

## 2016-07-21 DIAGNOSIS — Z79891 Long term (current) use of opiate analgesic: Secondary | ICD-10-CM | POA: Insufficient documentation

## 2016-07-21 DIAGNOSIS — Z9889 Other specified postprocedural states: Secondary | ICD-10-CM | POA: Insufficient documentation

## 2016-07-21 DIAGNOSIS — G8221 Paraplegia, complete: Secondary | ICD-10-CM
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
## 2016-07-25 ENCOUNTER — Ambulatory Visit: Payer: Medicaid Other | Admitting: Occupational Therapy

## 2016-07-25 ENCOUNTER — Encounter: Payer: Self-pay | Admitting: Physical Therapy

## 2016-07-25 ENCOUNTER — Encounter: Payer: Self-pay | Admitting: Occupational Therapy

## 2016-07-25 ENCOUNTER — Ambulatory Visit: Payer: Medicaid Other | Admitting: Physical Therapy

## 2016-07-25 DIAGNOSIS — R29818 Other symptoms and signs involving the nervous system: Secondary | ICD-10-CM

## 2016-07-25 DIAGNOSIS — M6281 Muscle weakness (generalized): Secondary | ICD-10-CM | POA: Diagnosis not present

## 2016-07-25 DIAGNOSIS — R293 Abnormal posture: Secondary | ICD-10-CM

## 2016-07-25 DIAGNOSIS — G8221 Paraplegia, complete: Secondary | ICD-10-CM

## 2016-07-25 DIAGNOSIS — R29898 Other symptoms and signs involving the musculoskeletal system: Secondary | ICD-10-CM

## 2016-07-25 DIAGNOSIS — M25612 Stiffness of left shoulder, not elsewhere classified: Secondary | ICD-10-CM
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
## 2016-07-25 NOTE — Patient Instructions (Signed)
Arm exercises:  Still the ones that you do lying down and now add these!  Sit in your wheelchair to do these. Its ok to work up to the three sets - you may only be able to do 2 when you first start. Do these 1-2 times per day.     1. Hold 2 pound weight in each hand.  First exercise is punch forward. Do one arm at time.  Do 3 sets of 15, resting in between each set.    2.  Hold 2 pound weight in each hand.  Keep elbow straight and start with hand on knee.  Raise arm over your head. Do one arm at time.  Do 3 sets of 15, resting in  Between each set.     3. Hold 2 pound weight in each hand.  Start with hand on shoulder palm facing forward. Punch up toward the ceiling. Do one arm at a time. Do 3 sets of 10, resting between each set.  As you get stronger you can build up to 3 sets of 15.

## 2016-07-28 ENCOUNTER — Encounter: Payer: Self-pay | Admitting: Occupational Therapy

## 2016-07-28 ENCOUNTER — Encounter: Payer: Self-pay | Admitting: Physical Therapy

## 2016-07-28 ENCOUNTER — Ambulatory Visit: Payer: Medicaid Other | Attending: Physical Medicine & Rehabilitation | Admitting: Occupational Therapy

## 2016-07-28 ENCOUNTER — Ambulatory Visit: Payer: Medicaid Other | Admitting: Physical Therapy

## 2016-07-28 DIAGNOSIS — M25612 Stiffness of left shoulder, not elsewhere classified: Secondary | ICD-10-CM | POA: Diagnosis present

## 2016-07-28 DIAGNOSIS — R29898 Other symptoms and signs involving the musculoskeletal system: Secondary | ICD-10-CM | POA: Diagnosis present

## 2016-07-28 DIAGNOSIS — G8221 Paraplegia, complete: Secondary | ICD-10-CM

## 2016-07-28 DIAGNOSIS — R29818 Other symptoms and signs involving the nervous system: Secondary | ICD-10-CM | POA: Insufficient documentation

## 2016-07-28 DIAGNOSIS — R293 Abnormal posture: Secondary | ICD-10-CM

## 2016-07-28 DIAGNOSIS — M6281 Muscle weakness (generalized): Secondary | ICD-10-CM | POA: Insufficient documentation
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
## 2016-08-02 ENCOUNTER — Ambulatory Visit: Payer: Medicaid Other | Admitting: Physical Therapy

## 2016-08-02 ENCOUNTER — Ambulatory Visit: Payer: Medicaid Other | Admitting: Occupational Therapy

## 2016-08-02 ENCOUNTER — Encounter: Payer: Self-pay | Admitting: Physical Therapy

## 2016-08-02 DIAGNOSIS — R293 Abnormal posture: Secondary | ICD-10-CM | POA: Diagnosis not present

## 2016-08-02 DIAGNOSIS — G8221 Paraplegia, complete: Secondary | ICD-10-CM

## 2016-08-02 DIAGNOSIS — M6281 Muscle weakness (generalized): Secondary | ICD-10-CM

## 2016-08-02 DIAGNOSIS — R29818 Other symptoms and signs involving the nervous system: Secondary | ICD-10-CM

## 2016-08-02 DIAGNOSIS — R29898 Other symptoms and signs involving the musculoskeletal system: Secondary | ICD-10-CM
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
## 2016-08-04 ENCOUNTER — Ambulatory Visit: Payer: Medicaid Other | Admitting: Physical Therapy

## 2016-08-04 ENCOUNTER — Ambulatory Visit: Payer: Medicaid Other | Admitting: Occupational Therapy

## 2016-08-04 ENCOUNTER — Encounter: Payer: Self-pay | Admitting: Occupational Therapy

## 2016-08-04 ENCOUNTER — Encounter: Payer: Self-pay | Admitting: Physical Therapy

## 2016-08-04 DIAGNOSIS — M6281 Muscle weakness (generalized): Secondary | ICD-10-CM

## 2016-08-04 DIAGNOSIS — G8221 Paraplegia, complete: Secondary | ICD-10-CM

## 2016-08-04 DIAGNOSIS — R29818 Other symptoms and signs involving the nervous system: Secondary | ICD-10-CM

## 2016-08-04 DIAGNOSIS — R293 Abnormal posture: Secondary | ICD-10-CM

## 2016-08-04 DIAGNOSIS — R29898 Other symptoms and signs involving the musculoskeletal system: Secondary | ICD-10-CM

## 2016-08-04 DIAGNOSIS — M25612 Stiffness of left shoulder, not elsewhere classified: Secondary | ICD-10-CM
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
## 2016-08-08 ENCOUNTER — Encounter: Payer: Medicaid Other | Attending: Physical Medicine & Rehabilitation | Admitting: Physical Medicine & Rehabilitation

## 2016-08-08 ENCOUNTER — Ambulatory Visit: Payer: Medicaid Other | Admitting: Rehabilitation

## 2016-08-08 ENCOUNTER — Encounter: Payer: Self-pay | Admitting: Physical Medicine & Rehabilitation

## 2016-08-08 ENCOUNTER — Encounter: Payer: Self-pay | Admitting: Occupational Therapy

## 2016-08-08 ENCOUNTER — Encounter: Payer: Self-pay | Admitting: Rehabilitation

## 2016-08-08 ENCOUNTER — Ambulatory Visit: Payer: Medicaid Other | Admitting: Occupational Therapy

## 2016-08-08 VITALS — BP 128/82 | HR 103

## 2016-08-08 DIAGNOSIS — S22068S Other fracture of T7-T8 thoracic vertebra, sequela: Secondary | ICD-10-CM | POA: Insufficient documentation

## 2016-08-08 DIAGNOSIS — K592 Neurogenic bowel, not elsewhere classified: Secondary | ICD-10-CM | POA: Diagnosis not present

## 2016-08-08 DIAGNOSIS — G8221 Paraplegia, complete: Secondary | ICD-10-CM

## 2016-08-08 DIAGNOSIS — S069X9S Unspecified intracranial injury with loss of consciousness of unspecified duration, sequela: Secondary | ICD-10-CM | POA: Diagnosis present

## 2016-08-08 DIAGNOSIS — S0181XS Laceration without foreign body of other part of head, sequela: Secondary | ICD-10-CM | POA: Insufficient documentation

## 2016-08-08 DIAGNOSIS — R52 Pain, unspecified: Secondary | ICD-10-CM

## 2016-08-08 DIAGNOSIS — M549 Dorsalgia, unspecified: Secondary | ICD-10-CM | POA: Diagnosis not present

## 2016-08-08 DIAGNOSIS — Z930 Tracheostomy status: Secondary | ICD-10-CM | POA: Diagnosis not present

## 2016-08-08 DIAGNOSIS — Z981 Arthrodesis status: Secondary | ICD-10-CM | POA: Diagnosis not present

## 2016-08-08 DIAGNOSIS — S27322S Contusion of lung, bilateral, sequela: Secondary | ICD-10-CM | POA: Diagnosis not present

## 2016-08-08 DIAGNOSIS — S27331S Laceration of lung, unilateral, sequela: Secondary | ICD-10-CM | POA: Insufficient documentation

## 2016-08-08 DIAGNOSIS — S42002S Fracture of unspecified part of left clavicle, sequela: Secondary | ICD-10-CM | POA: Insufficient documentation

## 2016-08-08 DIAGNOSIS — R293 Abnormal posture: Secondary | ICD-10-CM

## 2016-08-08 DIAGNOSIS — S24109S Unspecified injury at unspecified level of thoracic spinal cord, sequela: Secondary | ICD-10-CM | POA: Diagnosis not present

## 2016-08-08 DIAGNOSIS — R29898 Other symptoms and signs involving the musculoskeletal system: Secondary | ICD-10-CM

## 2016-08-08 DIAGNOSIS — S2221XS Fracture of manubrium, sequela: Secondary | ICD-10-CM | POA: Diagnosis not present

## 2016-08-08 DIAGNOSIS — G8220 Paraplegia, unspecified: Secondary | ICD-10-CM | POA: Insufficient documentation

## 2016-08-08 DIAGNOSIS — Z79891 Long term (current) use of opiate analgesic: Secondary | ICD-10-CM | POA: Diagnosis not present

## 2016-08-08 DIAGNOSIS — N319 Neuromuscular dysfunction of bladder, unspecified: Secondary | ICD-10-CM | POA: Diagnosis not present

## 2016-08-08 DIAGNOSIS — R29818 Other symptoms and signs involving the nervous system: Secondary | ICD-10-CM

## 2016-08-08 DIAGNOSIS — Z9889 Other specified postprocedural states: Secondary | ICD-10-CM | POA: Insufficient documentation

## 2016-08-08 DIAGNOSIS — G8929 Other chronic pain: Secondary | ICD-10-CM | POA: Insufficient documentation

## 2016-08-08 DIAGNOSIS — M6281 Muscle weakness (generalized): Secondary | ICD-10-CM

## 2016-08-08 DIAGNOSIS — M25612 Stiffness of left shoulder, not elsewhere classified: Secondary | ICD-10-CM

## 2016-08-08 MED ORDER — OXYBUTYNIN CHLORIDE 5 MG PO TABS: 5.0000 mg | ORAL_TABLET | Freq: Two times a day (BID) | ORAL | 3 refills | Status: DC

## 2016-08-08 MED FILL — OXYBUTYNIN 5 MG TABLET: 5 | 30 days supply | Qty: 60 | Fill #0

## 2016-08-08 NOTE — Patient Instructions (Signed)
TARGET BLADDER CATHS 3-4 X PER DAY.   GOAL IS TO STAY DRY AND FOR VOLUMES TO BE BETWEEN 300-500CC. YOU CAN ADJUST FREQUENCY BASED ON AMOUNT OF CATH.   FOLLOW UP WITH UROLOGY!!!   PLEASE FEEL FREE TO CALL OUR OFFICE WITH ANY PROBLEMS OR QUESTIONS [phone])
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
## 2016-08-08 NOTE — Patient Instructions (Signed)
Luis [name].    Exercise program for arms on universal gym (this program also works on sitting balance).  Pt is allowed to use equipment either before or after therapy sessions WITH SUPERVISION FROM [facility]. Go slowly and control the weight at times.  Make sure you control your balance as you work.    1.  Pull downs:  Sit with back to machine.  Grasp pull down bar and with both hands pull down and then lift back up.   20 pounds, 15 reps x 3 sets.  2.  Pull backs:  Face machine. Grasp upper loop.  Pull loop down toward hip and then slowly return to starting position.   Alternate your arms to do this one.  25 pounds, 15 reps x3 sets.  3.  Bicep curls:  Face machine.  Place pad over legs to protect them from the cable.  Grasp lower loop.  Pull up, bending elbow and then slowly return to starting position. Alternate arms with this one. 10 pounds, 10 reps x 3.  4.  Punch forward:  Sit with back to machine.  Grasp upper loop and punch forward until arm is extended then return to starting position.  Alterate arms with this one.  20 pounds, 10 reps x 3 sets.   If you develop any shoulder pain STOP and let your therapist know.  As you get stronger your therapist will reassess for additional weight/reps.
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
## 2016-08-11 ENCOUNTER — Encounter: Payer: Self-pay | Admitting: Occupational Therapy

## 2016-08-11 ENCOUNTER — Encounter: Payer: Self-pay | Admitting: Physical Therapy

## 2016-08-11 ENCOUNTER — Ambulatory Visit: Payer: Medicaid Other | Admitting: Physical Therapy

## 2016-08-11 DIAGNOSIS — R293 Abnormal posture: Secondary | ICD-10-CM | POA: Diagnosis not present

## 2016-08-11 DIAGNOSIS — G8221 Paraplegia, complete: Secondary | ICD-10-CM

## 2016-08-11 DIAGNOSIS — M6281 Muscle weakness (generalized): Secondary | ICD-10-CM
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
## 2016-08-15 ENCOUNTER — Encounter: Payer: Self-pay | Admitting: Rehabilitation

## 2016-08-15 ENCOUNTER — Ambulatory Visit: Payer: Medicaid Other | Admitting: Rehabilitation

## 2016-08-15 ENCOUNTER — Ambulatory Visit: Payer: Medicaid Other | Admitting: Occupational Therapy

## 2016-08-15 DIAGNOSIS — R293 Abnormal posture: Secondary | ICD-10-CM | POA: Diagnosis not present

## 2016-08-15 DIAGNOSIS — G8221 Paraplegia, complete: Secondary | ICD-10-CM

## 2016-08-15 DIAGNOSIS — M6281 Muscle weakness (generalized): Secondary | ICD-10-CM
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
## 2016-08-18 ENCOUNTER — Ambulatory Visit: Payer: Medicaid Other | Admitting: Occupational Therapy

## 2016-08-18 ENCOUNTER — Ambulatory Visit: Payer: Medicaid Other | Admitting: Physical Therapy

## 2016-08-18 ENCOUNTER — Encounter: Payer: Self-pay | Admitting: Physical Therapy

## 2016-08-18 ENCOUNTER — Encounter: Payer: Self-pay | Admitting: Occupational Therapy

## 2016-08-18 DIAGNOSIS — R293 Abnormal posture: Secondary | ICD-10-CM | POA: Diagnosis not present

## 2016-08-18 DIAGNOSIS — M25612 Stiffness of left shoulder, not elsewhere classified: Secondary | ICD-10-CM

## 2016-08-18 DIAGNOSIS — R29818 Other symptoms and signs involving the nervous system: Secondary | ICD-10-CM

## 2016-08-18 DIAGNOSIS — G8221 Paraplegia, complete: Secondary | ICD-10-CM

## 2016-08-18 DIAGNOSIS — M6281 Muscle weakness (generalized): Secondary | ICD-10-CM

## 2016-08-18 DIAGNOSIS — R29898 Other symptoms and signs involving the musculoskeletal system: Secondary | ICD-10-CM
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
## 2016-08-18 NOTE — Patient Instructions (Signed)
In long sitting position on your bed: 1. Alternate raising arms up while staying balanced. 10 reps each arm, progress from there.  2. Both arms raise straight up while staying balanced x 10 reps, progress up from there.  3. With wrists crossed in lap (X) and up in diagonal directions for making a Y, 10 reps, progress up from there.  4. With arms straight out and hands together. Bring each arm/hand out to side and back in (clapping). 10 reps, progress up from there.  In long sitting across your bed (feet can hand off if needed): 1. Elbow taps down to bed surface with reaching across body with other arm at same time. 10 reps each side, progress up from there.  2. With pillows stacked up behind back against wall; work on slow, controlled lowering of back to pillows and curl ups while reaching toward feet. 10 reps, progress up from there.

## 2016-08-23 ENCOUNTER — Encounter: Payer: Self-pay | Admitting: Physical Therapy

## 2016-08-23 ENCOUNTER — Ambulatory Visit: Payer: Medicaid Other | Admitting: Physical Therapy

## 2016-08-23 ENCOUNTER — Encounter: Payer: Self-pay | Admitting: Occupational Therapy

## 2016-08-23 ENCOUNTER — Ambulatory Visit: Payer: Medicaid Other | Admitting: Occupational Therapy

## 2016-08-23 DIAGNOSIS — R29818 Other symptoms and signs involving the nervous system: Secondary | ICD-10-CM

## 2016-08-23 DIAGNOSIS — M6281 Muscle weakness (generalized): Secondary | ICD-10-CM

## 2016-08-23 DIAGNOSIS — M25612 Stiffness of left shoulder, not elsewhere classified: Secondary | ICD-10-CM

## 2016-08-23 DIAGNOSIS — R29898 Other symptoms and signs involving the musculoskeletal system: Secondary | ICD-10-CM

## 2016-08-23 DIAGNOSIS — R293 Abnormal posture: Secondary | ICD-10-CM

## 2016-08-23 DIAGNOSIS — G8221 Paraplegia, complete: Secondary | ICD-10-CM
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
## 2016-08-25 ENCOUNTER — Ambulatory Visit: Payer: Medicaid Other | Admitting: Rehabilitation

## 2016-08-25 ENCOUNTER — Ambulatory Visit: Payer: Medicaid Other | Admitting: Occupational Therapy

## 2016-09-05 ENCOUNTER — Ambulatory Visit: Payer: Medicaid Other | Attending: Physical Medicine & Rehabilitation | Admitting: Occupational Therapy

## 2016-09-05 ENCOUNTER — Ambulatory Visit: Payer: Medicaid Other | Admitting: Rehabilitation

## 2016-09-05 ENCOUNTER — Encounter: Payer: Self-pay | Admitting: Occupational Therapy

## 2016-09-05 ENCOUNTER — Encounter: Payer: Self-pay | Admitting: Rehabilitation

## 2016-09-05 DIAGNOSIS — M25612 Stiffness of left shoulder, not elsewhere classified: Secondary | ICD-10-CM | POA: Insufficient documentation

## 2016-09-05 DIAGNOSIS — R293 Abnormal posture: Secondary | ICD-10-CM

## 2016-09-05 DIAGNOSIS — M6281 Muscle weakness (generalized): Secondary | ICD-10-CM

## 2016-09-05 DIAGNOSIS — G8221 Paraplegia, complete: Secondary | ICD-10-CM | POA: Insufficient documentation

## 2016-09-05 DIAGNOSIS — R29818 Other symptoms and signs involving the nervous system: Secondary | ICD-10-CM | POA: Insufficient documentation

## 2016-09-05 DIAGNOSIS — R29898 Other symptoms and signs involving the musculoskeletal system: Secondary | ICD-10-CM | POA: Diagnosis present
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
## 2016-09-07 ENCOUNTER — Encounter: Payer: Medicaid Other | Attending: Physical Medicine & Rehabilitation | Admitting: Psychology

## 2016-09-07 DIAGNOSIS — S42002S Fracture of unspecified part of left clavicle, sequela: Secondary | ICD-10-CM | POA: Diagnosis not present

## 2016-09-07 DIAGNOSIS — Z981 Arthrodesis status: Secondary | ICD-10-CM | POA: Diagnosis not present

## 2016-09-07 DIAGNOSIS — N319 Neuromuscular dysfunction of bladder, unspecified: Secondary | ICD-10-CM | POA: Insufficient documentation

## 2016-09-07 DIAGNOSIS — R52 Pain, unspecified: Secondary | ICD-10-CM

## 2016-09-07 DIAGNOSIS — G8220 Paraplegia, unspecified: Secondary | ICD-10-CM | POA: Diagnosis not present

## 2016-09-07 DIAGNOSIS — M549 Dorsalgia, unspecified: Secondary | ICD-10-CM | POA: Diagnosis not present

## 2016-09-07 DIAGNOSIS — S27331S Laceration of lung, unilateral, sequela: Secondary | ICD-10-CM | POA: Diagnosis not present

## 2016-09-07 DIAGNOSIS — S069X9S Unspecified intracranial injury with loss of consciousness of unspecified duration, sequela: Secondary | ICD-10-CM | POA: Insufficient documentation

## 2016-09-07 DIAGNOSIS — Z79891 Long term (current) use of opiate analgesic: Secondary | ICD-10-CM | POA: Insufficient documentation

## 2016-09-07 DIAGNOSIS — F329 Major depressive disorder, single episode, unspecified: Secondary | ICD-10-CM

## 2016-09-07 DIAGNOSIS — S0181XS Laceration without foreign body of other part of head, sequela: Secondary | ICD-10-CM | POA: Insufficient documentation

## 2016-09-07 DIAGNOSIS — S27322S Contusion of lung, bilateral, sequela: Secondary | ICD-10-CM | POA: Diagnosis not present

## 2016-09-07 DIAGNOSIS — S22068S Other fracture of T7-T8 thoracic vertebra, sequela: Secondary | ICD-10-CM | POA: Insufficient documentation

## 2016-09-07 DIAGNOSIS — Z930 Tracheostomy status: Secondary | ICD-10-CM | POA: Diagnosis not present

## 2016-09-07 DIAGNOSIS — G8929 Other chronic pain: Secondary | ICD-10-CM | POA: Diagnosis not present

## 2016-09-07 DIAGNOSIS — Z9889 Other specified postprocedural states: Secondary | ICD-10-CM | POA: Diagnosis not present

## 2016-09-07 DIAGNOSIS — S2221XS Fracture of manubrium, sequela: Secondary | ICD-10-CM | POA: Diagnosis not present

## 2016-09-13 ENCOUNTER — Encounter: Payer: Self-pay | Admitting: Physical Therapy

## 2016-09-13 ENCOUNTER — Encounter: Payer: Self-pay | Admitting: Occupational Therapy

## 2016-09-13 ENCOUNTER — Ambulatory Visit: Payer: Medicaid Other | Admitting: Physical Therapy

## 2016-09-13 DIAGNOSIS — R293 Abnormal posture: Secondary | ICD-10-CM

## 2016-09-13 DIAGNOSIS — G8221 Paraplegia, complete: Secondary | ICD-10-CM

## 2016-09-13 DIAGNOSIS — R29818 Other symptoms and signs involving the nervous system: Secondary | ICD-10-CM

## 2016-09-13 DIAGNOSIS — M6281 Muscle weakness (generalized): Secondary | ICD-10-CM
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
## 2016-09-16 ENCOUNTER — Telehealth: Payer: Self-pay | Admitting: *Deleted

## 2016-09-16 ENCOUNTER — Encounter: Payer: Self-pay | Admitting: Occupational Therapy

## 2016-09-16 ENCOUNTER — Encounter: Payer: Self-pay | Admitting: Physical Therapy

## 2016-09-16 ENCOUNTER — Ambulatory Visit: Payer: Medicaid Other | Admitting: Physical Therapy

## 2016-09-16 DIAGNOSIS — S24109S Unspecified injury at unspecified level of thoracic spinal cord, sequela: Secondary | ICD-10-CM

## 2016-09-16 DIAGNOSIS — R29818 Other symptoms and signs involving the nervous system: Secondary | ICD-10-CM

## 2016-09-16 DIAGNOSIS — M6281 Muscle weakness (generalized): Secondary | ICD-10-CM

## 2016-09-16 DIAGNOSIS — G8221 Paraplegia, complete: Secondary | ICD-10-CM

## 2016-09-16 DIAGNOSIS — R293 Abnormal posture: Secondary | ICD-10-CM | POA: Diagnosis not present

## 2016-09-16 MED ORDER — TRAMADOL HCL 50 MG PO TABS: 100.0000 mg | ORAL_TABLET | Freq: Every day | ORAL | 0 refills | Status: DC | PRN

## 2016-09-16 NOTE — Telephone Encounter (Signed)
Luis [name]'s sister called requesting a refill on his tramadol.  Refill called to [facility] for one month supply and family  Notified.
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
## 2016-09-20 ENCOUNTER — Encounter: Payer: Self-pay | Admitting: Occupational Therapy

## 2016-09-20 ENCOUNTER — Ambulatory Visit: Payer: Medicaid Other | Admitting: Physical Therapy

## 2016-09-20 ENCOUNTER — Encounter: Payer: Self-pay | Admitting: Physical Therapy

## 2016-09-20 DIAGNOSIS — R293 Abnormal posture: Secondary | ICD-10-CM

## 2016-09-20 DIAGNOSIS — G8221 Paraplegia, complete: Secondary | ICD-10-CM

## 2016-09-20 DIAGNOSIS — R29818 Other symptoms and signs involving the nervous system: Secondary | ICD-10-CM

## 2016-09-20 DIAGNOSIS — M6281 Muscle weakness (generalized): Secondary | ICD-10-CM

## 2016-09-22 ENCOUNTER — Encounter: Payer: Self-pay | Admitting: Occupational Therapy

## 2016-09-22 ENCOUNTER — Encounter: Payer: Self-pay | Admitting: Physical Therapy

## 2016-09-22 ENCOUNTER — Ambulatory Visit: Payer: Medicaid Other | Admitting: Physical Therapy

## 2016-09-22 DIAGNOSIS — R29818 Other symptoms and signs involving the nervous system: Secondary | ICD-10-CM

## 2016-09-22 DIAGNOSIS — R293 Abnormal posture: Secondary | ICD-10-CM | POA: Diagnosis not present

## 2016-09-22 DIAGNOSIS — G8221 Paraplegia, complete: Secondary | ICD-10-CM

## 2016-09-22 DIAGNOSIS — M6281 Muscle weakness (generalized): Secondary | ICD-10-CM
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
## 2016-09-26 ENCOUNTER — Encounter: Payer: Self-pay | Admitting: Occupational Therapy

## 2016-09-26 ENCOUNTER — Encounter: Payer: Self-pay | Admitting: Rehabilitation

## 2016-09-26 ENCOUNTER — Ambulatory Visit: Payer: Medicaid Other | Attending: Physical Medicine & Rehabilitation | Admitting: Rehabilitation

## 2016-09-26 DIAGNOSIS — R29818 Other symptoms and signs involving the nervous system: Secondary | ICD-10-CM | POA: Diagnosis present

## 2016-09-26 DIAGNOSIS — M6281 Muscle weakness (generalized): Secondary | ICD-10-CM | POA: Diagnosis present

## 2016-09-26 DIAGNOSIS — R293 Abnormal posture: Secondary | ICD-10-CM | POA: Insufficient documentation

## 2016-09-26 DIAGNOSIS — G8221 Paraplegia, complete: Secondary | ICD-10-CM | POA: Diagnosis present
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
## 2016-09-29 ENCOUNTER — Encounter: Payer: Self-pay | Admitting: Rehabilitation

## 2016-09-29 ENCOUNTER — Ambulatory Visit: Payer: Medicaid Other | Admitting: Rehabilitation

## 2016-09-29 ENCOUNTER — Encounter: Payer: Self-pay | Admitting: Occupational Therapy

## 2016-09-29 DIAGNOSIS — R29818 Other symptoms and signs involving the nervous system: Secondary | ICD-10-CM

## 2016-09-29 DIAGNOSIS — G8221 Paraplegia, complete: Secondary | ICD-10-CM | POA: Diagnosis not present

## 2016-09-29 DIAGNOSIS — R293 Abnormal posture: Secondary | ICD-10-CM

## 2016-09-29 DIAGNOSIS — M6281 Muscle weakness (generalized): Secondary | ICD-10-CM
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
## 2016-10-05 ENCOUNTER — Encounter: Payer: Self-pay | Admitting: Physical Medicine & Rehabilitation

## 2016-10-05 ENCOUNTER — Encounter: Payer: Medicaid Other | Attending: Physical Medicine & Rehabilitation | Admitting: Physical Medicine & Rehabilitation

## 2016-10-05 DIAGNOSIS — S2221XS Fracture of manubrium, sequela: Secondary | ICD-10-CM | POA: Diagnosis not present

## 2016-10-05 DIAGNOSIS — Z981 Arthrodesis status: Secondary | ICD-10-CM | POA: Insufficient documentation

## 2016-10-05 DIAGNOSIS — Z9889 Other specified postprocedural states: Secondary | ICD-10-CM | POA: Insufficient documentation

## 2016-10-05 DIAGNOSIS — G8929 Other chronic pain: Secondary | ICD-10-CM | POA: Diagnosis not present

## 2016-10-05 DIAGNOSIS — S24109S Unspecified injury at unspecified level of thoracic spinal cord, sequela: Secondary | ICD-10-CM

## 2016-10-05 DIAGNOSIS — S22068S Other fracture of T7-T8 thoracic vertebra, sequela: Secondary | ICD-10-CM | POA: Diagnosis not present

## 2016-10-05 DIAGNOSIS — S069X9S Unspecified intracranial injury with loss of consciousness of unspecified duration, sequela: Secondary | ICD-10-CM | POA: Insufficient documentation

## 2016-10-05 DIAGNOSIS — S0181XS Laceration without foreign body of other part of head, sequela: Secondary | ICD-10-CM | POA: Insufficient documentation

## 2016-10-05 DIAGNOSIS — S42002S Fracture of unspecified part of left clavicle, sequela: Secondary | ICD-10-CM | POA: Diagnosis not present

## 2016-10-05 DIAGNOSIS — G8220 Paraplegia, unspecified: Secondary | ICD-10-CM | POA: Diagnosis not present

## 2016-10-05 DIAGNOSIS — Z79891 Long term (current) use of opiate analgesic: Secondary | ICD-10-CM | POA: Insufficient documentation

## 2016-10-05 DIAGNOSIS — N319 Neuromuscular dysfunction of bladder, unspecified: Secondary | ICD-10-CM | POA: Insufficient documentation

## 2016-10-05 DIAGNOSIS — M549 Dorsalgia, unspecified: Secondary | ICD-10-CM | POA: Diagnosis not present

## 2016-10-05 DIAGNOSIS — S27331S Laceration of lung, unilateral, sequela: Secondary | ICD-10-CM | POA: Insufficient documentation

## 2016-10-05 DIAGNOSIS — Z930 Tracheostomy status: Secondary | ICD-10-CM | POA: Insufficient documentation

## 2016-10-05 DIAGNOSIS — S27322S Contusion of lung, bilateral, sequela: Secondary | ICD-10-CM | POA: Insufficient documentation

## 2016-10-05 MED ORDER — TRAMADOL HCL 50 MG PO TABS: 50.0000 mg | ORAL_TABLET | Freq: Two times a day (BID) | ORAL | 0 refills | Status: DC | PRN

## 2016-10-05 MED ORDER — OXYBUTYNIN CHLORIDE 5 MG PO TABS: 5.0000 mg | ORAL_TABLET | Freq: Two times a day (BID) | ORAL | 4 refills | Status: DC
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
## 2016-10-05 NOTE — Patient Instructions (Signed)
I WANT TO WEAN THE TRAMADOL  YOU CAN USE TYLENOL, IBUPROFEN, NAPROXEN FOR PAIN------THESE ARE ALL OVER THE COUNTER .   IF YOURS SPASMS WORSEN, CALL ME

## 2016-10-06 ENCOUNTER — Telehealth: Payer: Self-pay | Admitting: *Deleted

## 2016-10-06 NOTE — Telephone Encounter (Signed)
Patient's sister, Charlott Rakesida, called and left message stating that her brother, patient, saw Dr. Riley KillSwartz (referred to as patient's primary care physician and listed as such in EPIC) yesterday.  They are asking for a referral to [facility] for some sort of brace or therapeutic devices.  They were also asking about support systems (?). Apparently they failed to mention this when they were sitting in a room directly across from Dr. Riley KillSwartz (?). Please advise

## 2016-10-07 NOTE — Telephone Encounter (Signed)
i'll be happy to make referral. Need to know what they're referring to as i'm not sure.

## 2016-10-11 NOTE — Telephone Encounter (Signed)
Attempted to contact Luis [name].  I asked her to give us a call back to provide more detailed information on braces or therapeutic devices.

## 2016-10-17 NOTE — Telephone Encounter (Signed)
They were looking for some sort of support device for paraplegics.  They were looking through [facility]'s options and saw something called a C Brace to help paraplegics sit to stand.  They have received an appt to be evaluated by [facility] but the appt information asks them to obtain scripts from the MD.  If you need further clarification I spoke with Rachelle Horaida Willams sister ph number [phone]

## 2016-10-18 NOTE — Telephone Encounter (Signed)
I just spoke with Carolyne FiscalJeff Smith. Only [facility] and [facility] are covering these. [facility] would be happy to discuss the C- brace and other options with them, however.  I can make a referral tomorrow.

## 2016-10-20 ENCOUNTER — Encounter: Payer: Self-pay | Admitting: Psychology

## 2016-10-20 ENCOUNTER — Encounter (HOSPITAL_BASED_OUTPATIENT_CLINIC_OR_DEPARTMENT_OTHER): Payer: Medicaid Other | Admitting: Psychology

## 2016-10-20 DIAGNOSIS — G8220 Paraplegia, unspecified: Secondary | ICD-10-CM | POA: Diagnosis not present

## 2016-10-20 DIAGNOSIS — R52 Pain, unspecified: Secondary | ICD-10-CM | POA: Diagnosis not present

## 2016-10-20 DIAGNOSIS — S24109S Unspecified injury at unspecified level of thoracic spinal cord, sequela: Secondary | ICD-10-CM | POA: Diagnosis not present

## 2016-10-20 DIAGNOSIS — F329 Major depressive disorder, single episode, unspecified: Secondary | ICD-10-CM | POA: Diagnosis not present

## 2016-10-20 NOTE — Progress Notes (Signed)
Patient:  Luis [name]   DOB: [birth year]  MR Number: [id number]  [facility] [address], [place] [address] [id number] [facility] [place] [id number] Dept: [phone]  Start: 1 PM End: 2 PM  Provider/Observer:     Hershal CoriaJohn R Christal Lagerstrom PSYD  Chief Complaint:      Chief Complaint  Patient presents with  . Spine Injury  . Stress    Reason For Service:     The patient was referred for psychological/neuropsychological assessment and review regarding coping issues related to adjustment to severe traumatic injury.  He has suffered spinal cord injury with paraplegia as well as concern for TBI.  The patient had asked after first appointment to talk again to review some of the coping issues we had addressed prior.  Interventions Strategy:  Cognitive Behavioral Interventions and coping skills  Participation Level:   Active  Participation Quality:  Appropriate, Attentive and Sharing      Behavioral Observation:  Well Groomed, Alert, and Appropriate.   Current Psychosocial Factors: The patient has a very supportive family and appears to have the support he will need going further.  Content of Session:   Reviewed current situation and worked on further developing his coping and psychological adaptive skills.  The patient reports that he has really been working on building mussle mass in upper body and working on Pharmacologistcoping skills.  Current Status:   The patient appears to be doing well psychologically given the sitatuation.  The patient denies continuation of anxiety or depressive symptoms.   Patient Progress:   Very good psychologically.    Target Goals:   Target goals were to work on skills around adapting to significant physical limiations.  Last Reviewed:   10/20/2016  Goals Addressed Today:    Worked on furthering his coping skills.  Impression/Diagnosis:   The patient is trying  to adjust to the acute changes in physical functioning and the real possibility that he will not regain the use of his legs.  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  He does appear to have adapted well and there is no further plan for neuropsychological/psychological interventions.  Diagnosis:   Injury of thoracic spinal cord, sequela (HCC)  Paraplegia (HCC)  Pain  Reactive depression

## 2016-10-20 NOTE — Progress Notes (Signed)
Patient:  Luis [name]   DOB: 26-Feb-1996  MR Number: [id number]  [facility] [address], [place] [address] [id number] [place] [id number] Dept: [phone]  Start: 11 AM  12 PM  Provider/Observer:     Hershal CoriaJohn R Jermey Closs PSYD  Chief Complaint:      Chief Complaint  Patient presents with  . Spine Injury  . Depression  . Stress  . Pain    Reason For Service:     The patient was referred for psychological/neuropsychological assessment and review regarding coping issues related to adjustment to severe traumatic injury.  He has suffered spinal cord injury with paraplegia as well as concern for TBI.  The patient had asked after first appointment to talk again to review some of the coping issues we had addressed prior. THe patient has now been discharged and we are working with him for outpatient recovery dealing with reactive depression and coping with paraplegia and loss of function in his legs. The patient's cognitive function continues to appear to be quite good. He has responded from a cognitive standpoint very well in his recovery  Interventions Strategy:  Cognitive Behavioral Interventions and coping skills  Participation Level:   Active  Participation Quality:  Appropriate, Attentive and Sharing      Behavioral Observation:  Well Groomed, Alert, and Appropriate.   Current Psychosocial Factors: The patient has a very supportive family and appears to have the support he will negoing forward. The patient's sister was present for part of the visit today and they seem to be doing very well and she is very supportive of his recovery.  Content of Session:   Reviewed current situation and worked on further developing his coping and psychological adaptive skills.  Current Status:   The patient appears to be doing well psychologically given the sitatuation.  The patient  denies continuation of anxiety or depressive symptoms.   Patient Progress:   Very good psychologically.    Target Goals:   Target goals were to work on skills around adapting to significant physical limiations.  Last Reviewed:   10/07/2016  Goals Addressed Today:    Worked on furthering his coping skills.  Impression/Diagnosis:   The patient is trying to adjust to the acute changes in physical functioning and the real possibility that he will not regain the use of his legs.  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  He does appear to have adapted well and there is no further plan for neuropsychological/psychological interventions.  Diagnosis:   Paraplegia (HCC)  Pain  Reactive depression

## 2016-10-21 ENCOUNTER — Telehealth: Payer: Self-pay | Admitting: Physical Medicine & Rehabilitation

## 2016-10-21 NOTE — Telephone Encounter (Signed)
[facility] by Scarlette Slicehris Roura and sister has questions about pursuing something in brace. Please call her at [phone].

## 2016-10-26 NOTE — Telephone Encounter (Signed)
Patient's sister, Charlott Rakesida, called back and left a message.  She apologizes for missing Dr. Rosalyn ChartersSwartz's  phone call. She is asking for a call back to discuss the bracing options provided [facility].  Please call Aida back at [phone]

## 2016-11-22 ENCOUNTER — Encounter: Payer: Medicaid Other | Attending: Physical Medicine & Rehabilitation | Admitting: Psychology

## 2016-11-22 DIAGNOSIS — F329 Major depressive disorder, single episode, unspecified: Secondary | ICD-10-CM | POA: Diagnosis not present

## 2016-11-22 DIAGNOSIS — S27331S Laceration of lung, unilateral, sequela: Secondary | ICD-10-CM | POA: Diagnosis not present

## 2016-11-22 DIAGNOSIS — G8929 Other chronic pain: Secondary | ICD-10-CM | POA: Diagnosis not present

## 2016-11-22 DIAGNOSIS — Z9889 Other specified postprocedural states: Secondary | ICD-10-CM | POA: Diagnosis not present

## 2016-11-22 DIAGNOSIS — S27322S Contusion of lung, bilateral, sequela: Secondary | ICD-10-CM | POA: Insufficient documentation

## 2016-11-22 DIAGNOSIS — N319 Neuromuscular dysfunction of bladder, unspecified: Secondary | ICD-10-CM | POA: Insufficient documentation

## 2016-11-22 DIAGNOSIS — Z79891 Long term (current) use of opiate analgesic: Secondary | ICD-10-CM | POA: Diagnosis not present

## 2016-11-22 DIAGNOSIS — S22068S Other fracture of T7-T8 thoracic vertebra, sequela: Secondary | ICD-10-CM | POA: Insufficient documentation

## 2016-11-22 DIAGNOSIS — Z981 Arthrodesis status: Secondary | ICD-10-CM | POA: Diagnosis not present

## 2016-11-22 DIAGNOSIS — R52 Pain, unspecified: Secondary | ICD-10-CM | POA: Diagnosis not present

## 2016-11-22 DIAGNOSIS — Z930 Tracheostomy status: Secondary | ICD-10-CM | POA: Diagnosis not present

## 2016-11-22 DIAGNOSIS — S42002S Fracture of unspecified part of left clavicle, sequela: Secondary | ICD-10-CM | POA: Diagnosis not present

## 2016-11-22 DIAGNOSIS — S069X9S Unspecified intracranial injury with loss of consciousness of unspecified duration, sequela: Secondary | ICD-10-CM | POA: Diagnosis present

## 2016-11-22 DIAGNOSIS — S2221XS Fracture of manubrium, sequela: Secondary | ICD-10-CM | POA: Insufficient documentation

## 2016-11-22 DIAGNOSIS — S24109S Unspecified injury at unspecified level of thoracic spinal cord, sequela: Secondary | ICD-10-CM | POA: Diagnosis not present

## 2016-11-22 DIAGNOSIS — M549 Dorsalgia, unspecified: Secondary | ICD-10-CM | POA: Diagnosis not present

## 2016-11-22 DIAGNOSIS — G8220 Paraplegia, unspecified: Secondary | ICD-10-CM | POA: Insufficient documentation

## 2016-11-22 DIAGNOSIS — S0181XS Laceration without foreign body of other part of head, sequela: Secondary | ICD-10-CM | POA: Insufficient documentation

## 2016-11-24 NOTE — Progress Notes (Signed)
Patient:  Luis [name]   DOB: July 10, 1995  MR Number: [id number]  [facility] [address], [place] [address] [id number] [place] [address] Dept: [phone]  Start: 1 PM End: 2 PM  Provider/Observer:     Hershal CoriaJohn R Masyn Rostro PSYD  Chief Complaint:      Chief Complaint  Patient presents with  . Pain  . Spine Injury  . Stress  . Depression    Reason For Service:     The patient was referred for psychological/neuropsychological assessment and review regarding coping issues related to adjustment to severe traumatic injury.  He has suffered spinal cord injury with paraplegia as well as concern for TBI.  The patient had asked after first appointment to talk again to review some of the coping issues we had addressed prior.  Interventions Strategy:  Cognitive Behavioral Interventions and coping skills  Participation Level:   Active  Participation Quality:  Appropriate, Attentive and Sharing      Behavioral Observation:  Well Groomed, Alert, and Appropriate.   Current Psychosocial Factors: The patient has a very supportive family and appears to have the support he will need going further.  Content of Session:   Reviewed current situation and worked on further developing his coping and psychological adaptive skills.  The patient reports that he has really been working on building mussle mass in upper body and working on Pharmacologistcoping skills.  Current Status:   The patient appears to be doing well psychologically given the sitatuation.  The patient denies continuation of anxiety or depressive symptoms.   Patient Progress:   Very good psychologically.    Target Goals:   Target goals were to work on skills around adapting to significant physical limiations.  Last Reviewed:   11/22/2016  Goals Addressed Today:    Worked on furthering his coping  skills.  Impression/Diagnosis:   The patient is trying to adjust to the acute changes in physical functioning and the real possibility that he will not regain the use of his legs.  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  He does appear to have adapted well and there is no further plan for neuropsychological/psychological interventions.  Diagnosis:   Injury of thoracic spinal cord, sequela (HCC)  Paraplegia (HCC)  Pain  Reactive depression

## 2017-01-03 ENCOUNTER — Encounter: Payer: Medicaid Other | Attending: Physical Medicine & Rehabilitation | Admitting: Psychology

## 2017-01-03 DIAGNOSIS — S42002S Fracture of unspecified part of left clavicle, sequela: Secondary | ICD-10-CM | POA: Insufficient documentation

## 2017-01-03 DIAGNOSIS — S22068S Other fracture of T7-T8 thoracic vertebra, sequela: Secondary | ICD-10-CM | POA: Insufficient documentation

## 2017-01-03 DIAGNOSIS — Z930 Tracheostomy status: Secondary | ICD-10-CM | POA: Insufficient documentation

## 2017-01-03 DIAGNOSIS — S27322S Contusion of lung, bilateral, sequela: Secondary | ICD-10-CM | POA: Diagnosis not present

## 2017-01-03 DIAGNOSIS — S0181XS Laceration without foreign body of other part of head, sequela: Secondary | ICD-10-CM | POA: Diagnosis not present

## 2017-01-03 DIAGNOSIS — S27331S Laceration of lung, unilateral, sequela: Secondary | ICD-10-CM | POA: Insufficient documentation

## 2017-01-03 DIAGNOSIS — G8220 Paraplegia, unspecified: Secondary | ICD-10-CM | POA: Diagnosis not present

## 2017-01-03 DIAGNOSIS — M549 Dorsalgia, unspecified: Secondary | ICD-10-CM | POA: Insufficient documentation

## 2017-01-03 DIAGNOSIS — Z79891 Long term (current) use of opiate analgesic: Secondary | ICD-10-CM | POA: Diagnosis not present

## 2017-01-03 DIAGNOSIS — S069X9S Unspecified intracranial injury with loss of consciousness of unspecified duration, sequela: Secondary | ICD-10-CM | POA: Diagnosis present

## 2017-01-03 DIAGNOSIS — S2221XS Fracture of manubrium, sequela: Secondary | ICD-10-CM | POA: Diagnosis not present

## 2017-01-03 DIAGNOSIS — G8929 Other chronic pain: Secondary | ICD-10-CM | POA: Insufficient documentation

## 2017-01-03 DIAGNOSIS — R52 Pain, unspecified: Secondary | ICD-10-CM

## 2017-01-03 DIAGNOSIS — Z981 Arthrodesis status: Secondary | ICD-10-CM | POA: Diagnosis not present

## 2017-01-03 DIAGNOSIS — Z9889 Other specified postprocedural states: Secondary | ICD-10-CM | POA: Diagnosis not present

## 2017-01-03 DIAGNOSIS — S24109S Unspecified injury at unspecified level of thoracic spinal cord, sequela: Secondary | ICD-10-CM | POA: Diagnosis not present

## 2017-01-03 DIAGNOSIS — N319 Neuromuscular dysfunction of bladder, unspecified: Secondary | ICD-10-CM | POA: Insufficient documentation
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
## 2017-01-04 ENCOUNTER — Encounter: Payer: Self-pay | Admitting: Physical Medicine & Rehabilitation

## 2017-01-04 ENCOUNTER — Encounter (HOSPITAL_BASED_OUTPATIENT_CLINIC_OR_DEPARTMENT_OTHER): Payer: Medicaid Other | Admitting: Physical Medicine & Rehabilitation

## 2017-01-04 VITALS — BP 110/62 | HR 101

## 2017-01-04 DIAGNOSIS — G8220 Paraplegia, unspecified: Secondary | ICD-10-CM

## 2017-01-04 DIAGNOSIS — S24109S Unspecified injury at unspecified level of thoracic spinal cord, sequela: Secondary | ICD-10-CM | POA: Diagnosis not present

## 2017-01-04 NOTE — Patient Instructions (Signed)
NAPROXEN ( ) EVERY 12 HOURS OR IBUPROFEN [phone]  EVERY 8 HOURS AS NEEDED FOR PAIN. YOU CAN USE TYLENOL AS WELL.   HEAT, ICE, MASSAGE CAN HELP TOO.   WORK ON GETTING TO A REGULAR EVERY 2 DAY BOWEL PROGRAM----EXPERIMENT!!!   PLEASE FEEL FREE TO CALL OUR OFFICE WITH ANY PROBLEMS OR QUESTIONS [phone])
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
## 2017-03-04 ENCOUNTER — Other Ambulatory Visit: Payer: Self-pay | Admitting: Physical Medicine & Rehabilitation

## 2017-04-18 ENCOUNTER — Encounter: Payer: Self-pay | Admitting: Occupational Therapy

## 2017-07-05 ENCOUNTER — Encounter: Payer: Medicaid Other | Attending: Physical Medicine & Rehabilitation | Admitting: Physical Medicine & Rehabilitation

## 2017-07-31 ENCOUNTER — Other Ambulatory Visit: Payer: Self-pay | Admitting: Physical Medicine & Rehabilitation

## 2017-09-03 ENCOUNTER — Other Ambulatory Visit: Payer: Self-pay | Admitting: Physical Medicine & Rehabilitation

## 2017-09-15 ENCOUNTER — Other Ambulatory Visit: Payer: Self-pay | Admitting: Physical Medicine & Rehabilitation

## 2017-09-25 DIAGNOSIS — R339 Retention of urine, unspecified: Secondary | ICD-10-CM | POA: Diagnosis not present

## 2017-10-17 ENCOUNTER — Telehealth: Payer: Self-pay

## 2017-10-17 NOTE — Telephone Encounter (Signed)
Pt called asking for a letter to get out of jury duty due to being in a wheelchair for his condition.  Called pt back to let him know we need a copy of the jury summons then we will ask Dr. Riley KillSwartz about the letter.

## 2017-10-23 ENCOUNTER — Other Ambulatory Visit: Payer: Self-pay | Admitting: Physical Medicine & Rehabilitation

## 2017-10-25 ENCOUNTER — Encounter: Payer: Self-pay | Admitting: Physical Medicine & Rehabilitation

## 2017-10-26 DIAGNOSIS — R339 Retention of urine, unspecified: Secondary | ICD-10-CM | POA: Diagnosis not present

## 2017-11-06 ENCOUNTER — Other Ambulatory Visit: Payer: Self-pay | Admitting: Physical Medicine & Rehabilitation

## 2017-11-28 DIAGNOSIS — N312 Flaccid neuropathic bladder, not elsewhere classified: Secondary | ICD-10-CM | POA: Diagnosis not present

## 2017-11-28 DIAGNOSIS — N3941 Urge incontinence: Secondary | ICD-10-CM | POA: Diagnosis not present

## 2017-12-01 DIAGNOSIS — R339 Retention of urine, unspecified: Secondary | ICD-10-CM | POA: Diagnosis not present

## 2017-12-03 ENCOUNTER — Other Ambulatory Visit: Payer: Self-pay | Admitting: Physical Medicine & Rehabilitation

## 2017-12-08 ENCOUNTER — Other Ambulatory Visit: Payer: Self-pay | Admitting: Physical Medicine & Rehabilitation

## 2017-12-11 ENCOUNTER — Other Ambulatory Visit: Payer: Self-pay | Admitting: Physical Medicine & Rehabilitation

## 2017-12-18 ENCOUNTER — Telehealth: Payer: Self-pay | Admitting: *Deleted

## 2017-12-18 MED ORDER — OXYBUTYNIN CHLORIDE 5 MG PO TABS: 5.0000 mg | ORAL_TABLET | Freq: Two times a day (BID) | ORAL | 0 refills | Status: DC

## 2017-12-18 NOTE — Telephone Encounter (Signed)
Luis [name] needs refill on his ditropan. I called and told him he needs an appt because we have not seen him since last October. I will refill one more time and he will need to make an appt.  I have transferred to Luis [name] to make the appt.

## 2017-12-27 ENCOUNTER — Encounter: Payer: Medicaid Other | Attending: Physical Medicine & Rehabilitation | Admitting: Physical Medicine & Rehabilitation

## 2017-12-27 ENCOUNTER — Other Ambulatory Visit: Payer: Self-pay

## 2017-12-27 ENCOUNTER — Encounter: Payer: Self-pay | Admitting: Physical Medicine & Rehabilitation

## 2017-12-27 VITALS — BP 139/86 | HR 109 | Ht 68.0 in | Wt 180.0 lb

## 2017-12-27 DIAGNOSIS — S24109S Unspecified injury at unspecified level of thoracic spinal cord, sequela: Secondary | ICD-10-CM

## 2017-12-27 DIAGNOSIS — K592 Neurogenic bowel, not elsewhere classified: Secondary | ICD-10-CM | POA: Diagnosis not present

## 2017-12-27 DIAGNOSIS — N319 Neuromuscular dysfunction of bladder, unspecified: Secondary | ICD-10-CM | POA: Diagnosis not present

## 2017-12-27 DIAGNOSIS — G8220 Paraplegia, unspecified: Secondary | ICD-10-CM

## 2017-12-27 DIAGNOSIS — X58XXXS Exposure to other specified factors, sequela: Secondary | ICD-10-CM | POA: Diagnosis not present

## 2017-12-27 DIAGNOSIS — Z9889 Other specified postprocedural states: Secondary | ICD-10-CM | POA: Diagnosis not present

## 2017-12-27 DIAGNOSIS — Z87820 Personal history of traumatic brain injury: Secondary | ICD-10-CM | POA: Diagnosis not present

## 2017-12-27 DIAGNOSIS — R52 Pain, unspecified: Secondary | ICD-10-CM | POA: Diagnosis not present

## 2017-12-27 MED ORDER — OXYBUTYNIN CHLORIDE 5 MG PO TABS: 5.0000 mg | ORAL_TABLET | Freq: Two times a day (BID) | ORAL | 6 refills | Status: DC

## 2017-12-27 MED ORDER — NORTRIPTYLINE HCL 10 MG PO CAPS: 10.0000 mg | ORAL_CAPSULE | Freq: Every day | ORAL | 3 refills | Status: DC
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
## 2017-12-27 NOTE — Patient Instructions (Signed)
PLEASE FEEL FREE TO CALL OUR OFFICE WITH ANY PROBLEMS OR QUESTIONS ([phone])      

## 2017-12-28 DIAGNOSIS — R339 Retention of urine, unspecified: Secondary | ICD-10-CM | POA: Diagnosis not present

## 2018-01-28 DIAGNOSIS — R339 Retention of urine, unspecified: Secondary | ICD-10-CM | POA: Diagnosis not present

## 2018-02-14 DIAGNOSIS — G44209 Tension-type headache, unspecified, not intractable: Secondary | ICD-10-CM | POA: Diagnosis not present

## 2018-02-14 DIAGNOSIS — H40033 Anatomical narrow angle, bilateral: Secondary | ICD-10-CM | POA: Diagnosis not present

## 2018-02-27 ENCOUNTER — Encounter: Payer: Self-pay | Admitting: Physical Medicine & Rehabilitation

## 2018-02-27 ENCOUNTER — Encounter: Payer: Medicaid Other | Attending: Physical Medicine & Rehabilitation | Admitting: Physical Medicine & Rehabilitation

## 2018-02-27 VITALS — BP 143/87 | HR 109 | Ht 68.0 in | Wt 200.0 lb

## 2018-02-27 DIAGNOSIS — Z87820 Personal history of traumatic brain injury: Secondary | ICD-10-CM | POA: Insufficient documentation

## 2018-02-27 DIAGNOSIS — N319 Neuromuscular dysfunction of bladder, unspecified: Secondary | ICD-10-CM

## 2018-02-27 DIAGNOSIS — G8220 Paraplegia, unspecified: Secondary | ICD-10-CM | POA: Insufficient documentation

## 2018-02-27 DIAGNOSIS — S24109S Unspecified injury at unspecified level of thoracic spinal cord, sequela: Secondary | ICD-10-CM | POA: Insufficient documentation

## 2018-02-27 DIAGNOSIS — Z9889 Other specified postprocedural states: Secondary | ICD-10-CM | POA: Diagnosis not present

## 2018-02-27 DIAGNOSIS — K592 Neurogenic bowel, not elsewhere classified: Secondary | ICD-10-CM | POA: Diagnosis not present
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
## 2018-02-27 NOTE — Patient Instructions (Signed)
PLEASE FEEL FREE TO CALL OUR OFFICE WITH ANY PROBLEMS OR QUESTIONS ([phone])      

## 2018-03-02 DIAGNOSIS — R339 Retention of urine, unspecified: Secondary | ICD-10-CM | POA: Diagnosis not present

## 2018-03-30 DIAGNOSIS — R339 Retention of urine, unspecified: Secondary | ICD-10-CM | POA: Diagnosis not present

## 2018-04-02 ENCOUNTER — Other Ambulatory Visit: Payer: Self-pay

## 2018-04-02 ENCOUNTER — Ambulatory Visit: Payer: Medicaid Other | Attending: Physical Medicine & Rehabilitation | Admitting: Rehabilitation

## 2018-04-02 ENCOUNTER — Encounter: Payer: Self-pay | Admitting: Rehabilitation

## 2018-04-02 DIAGNOSIS — R293 Abnormal posture: Secondary | ICD-10-CM | POA: Insufficient documentation

## 2018-04-02 DIAGNOSIS — R29818 Other symptoms and signs involving the nervous system: Secondary | ICD-10-CM | POA: Diagnosis not present

## 2018-04-02 DIAGNOSIS — R29898 Other symptoms and signs involving the musculoskeletal system: Secondary | ICD-10-CM | POA: Diagnosis not present

## 2018-04-02 DIAGNOSIS — M6281 Muscle weakness (generalized): Secondary | ICD-10-CM | POA: Insufficient documentation

## 2018-04-02 DIAGNOSIS — G8221 Paraplegia, complete: Secondary | ICD-10-CM
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
## 2018-04-18 ENCOUNTER — Ambulatory Visit: Payer: Medicaid Other | Admitting: Physical Therapy

## 2018-04-19 ENCOUNTER — Ambulatory Visit: Payer: Medicaid Other | Admitting: Physical Therapy

## 2018-04-19 DIAGNOSIS — M6281 Muscle weakness (generalized): Secondary | ICD-10-CM

## 2018-04-19 DIAGNOSIS — G8221 Paraplegia, complete: Secondary | ICD-10-CM | POA: Diagnosis not present

## 2018-04-19 DIAGNOSIS — R29818 Other symptoms and signs involving the nervous system: Secondary | ICD-10-CM

## 2018-04-19 DIAGNOSIS — R293 Abnormal posture: Secondary | ICD-10-CM | POA: Diagnosis not present

## 2018-04-19 DIAGNOSIS — R29898 Other symptoms and signs involving the musculoskeletal system: Secondary | ICD-10-CM | POA: Diagnosis not present
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
## 2018-04-25 ENCOUNTER — Ambulatory Visit: Payer: Medicaid Other | Admitting: Physical Therapy

## 2018-04-25 ENCOUNTER — Encounter: Payer: Self-pay | Admitting: Physical Therapy

## 2018-04-25 ENCOUNTER — Ambulatory Visit: Payer: Self-pay | Admitting: Physical Therapy

## 2018-04-25 DIAGNOSIS — R29818 Other symptoms and signs involving the nervous system: Secondary | ICD-10-CM

## 2018-04-25 DIAGNOSIS — G8221 Paraplegia, complete: Secondary | ICD-10-CM | POA: Diagnosis not present

## 2018-04-25 DIAGNOSIS — R29898 Other symptoms and signs involving the musculoskeletal system: Secondary | ICD-10-CM | POA: Diagnosis not present

## 2018-04-25 DIAGNOSIS — R293 Abnormal posture: Secondary | ICD-10-CM

## 2018-04-25 DIAGNOSIS — M6281 Muscle weakness (generalized): Secondary | ICD-10-CM
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
## 2018-05-01 DIAGNOSIS — R339 Retention of urine, unspecified: Secondary | ICD-10-CM | POA: Diagnosis not present

## 2018-05-02 ENCOUNTER — Encounter: Payer: Self-pay | Admitting: Physical Therapy

## 2018-05-02 ENCOUNTER — Ambulatory Visit: Payer: Medicaid Other | Attending: Physical Medicine & Rehabilitation | Admitting: Physical Therapy

## 2018-05-02 DIAGNOSIS — R293 Abnormal posture: Secondary | ICD-10-CM | POA: Diagnosis not present

## 2018-05-02 DIAGNOSIS — M6281 Muscle weakness (generalized): Secondary | ICD-10-CM | POA: Insufficient documentation

## 2018-05-02 DIAGNOSIS — G8221 Paraplegia, complete: Secondary | ICD-10-CM | POA: Diagnosis not present

## 2018-05-02 DIAGNOSIS — R29818 Other symptoms and signs involving the nervous system: Secondary | ICD-10-CM | POA: Insufficient documentation

## 2018-05-02 DIAGNOSIS — R2689 Other abnormalities of gait and mobility: Secondary | ICD-10-CM | POA: Insufficient documentation
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
## 2018-05-09 ENCOUNTER — Encounter: Payer: Self-pay | Admitting: Physical Therapy

## 2018-05-09 ENCOUNTER — Ambulatory Visit: Payer: Medicaid Other | Admitting: Physical Therapy

## 2018-05-09 DIAGNOSIS — G8221 Paraplegia, complete: Secondary | ICD-10-CM | POA: Diagnosis not present

## 2018-05-09 DIAGNOSIS — R29818 Other symptoms and signs involving the nervous system: Secondary | ICD-10-CM

## 2018-05-09 DIAGNOSIS — R293 Abnormal posture: Secondary | ICD-10-CM | POA: Diagnosis not present

## 2018-05-09 DIAGNOSIS — M6281 Muscle weakness (generalized): Secondary | ICD-10-CM

## 2018-05-09 DIAGNOSIS — R2689 Other abnormalities of gait and mobility: Secondary | ICD-10-CM | POA: Diagnosis not present
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
## 2018-05-16 ENCOUNTER — Encounter: Payer: Self-pay | Admitting: Physical Therapy

## 2018-05-16 ENCOUNTER — Ambulatory Visit: Payer: Medicaid Other | Admitting: Physical Therapy

## 2018-05-16 DIAGNOSIS — R2689 Other abnormalities of gait and mobility: Secondary | ICD-10-CM

## 2018-05-16 DIAGNOSIS — G8221 Paraplegia, complete: Secondary | ICD-10-CM

## 2018-05-16 DIAGNOSIS — R29818 Other symptoms and signs involving the nervous system: Secondary | ICD-10-CM

## 2018-05-16 DIAGNOSIS — M6281 Muscle weakness (generalized): Secondary | ICD-10-CM | POA: Diagnosis not present

## 2018-05-16 DIAGNOSIS — R293 Abnormal posture: Secondary | ICD-10-CM
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
## 2018-05-23 ENCOUNTER — Ambulatory Visit: Payer: Medicaid Other | Admitting: Physical Therapy

## 2018-05-23 ENCOUNTER — Telehealth: Payer: Self-pay | Admitting: Physical Therapy

## 2018-05-23 ENCOUNTER — Encounter: Payer: Self-pay | Admitting: Physical Therapy

## 2018-05-23 DIAGNOSIS — R293 Abnormal posture: Secondary | ICD-10-CM

## 2018-05-23 DIAGNOSIS — R2689 Other abnormalities of gait and mobility: Secondary | ICD-10-CM | POA: Diagnosis not present

## 2018-05-23 DIAGNOSIS — R29818 Other symptoms and signs involving the nervous system: Secondary | ICD-10-CM

## 2018-05-23 DIAGNOSIS — M6281 Muscle weakness (generalized): Secondary | ICD-10-CM | POA: Diagnosis not present

## 2018-05-23 DIAGNOSIS — G8221 Paraplegia, complete: Secondary | ICD-10-CM

## 2018-05-23 NOTE — Telephone Encounter (Signed)
Hi Dr. Riley Kill, Luis [name] has been working very hard in therapy to be able to stand with bilat KAFO.  We have been able to progress from just standing in the parallel bars to advancing each LE forwards and ambulating down the parallel bars.  Chris from [facility] came and observed one of our sessions and felt like Luis [name] would be a good candidate for RGOs.  In order to have those covered [facility] would need a physician's order for bilateral RGO and a face to face visit with you.    Luis [name] is going to be contacting your office about setting up a face to face but I know you are transitioning to inpatient rehab only soon, so I wanted to see what your thoughts were on being able to do a face to face before you transition.  If there is no availability in your outpatient schedule is there another physician that would be willing to do the face to face and order?  Thanks, Dierdre Highman, PT, DPT 05/23/18    5:21 PM   P.S. - If Luis [name] does get to see you one more time, he has a video of him walking in the parallel bars to show you!  He was very proud.
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
## 2018-05-27 DIAGNOSIS — R339 Retention of urine, unspecified: Secondary | ICD-10-CM | POA: Diagnosis not present

## 2018-05-28 NOTE — Telephone Encounter (Signed)
I would be happy to see him to do that. I will be still seeing patients on Tuesdays and Wednesdays this year and on Wednesdays in the long term. So I will be retaining some of my outpt practice.   Thanks for your help!

## 2018-06-01 ENCOUNTER — Ambulatory Visit: Payer: Medicaid Other | Admitting: Physical Therapy

## 2018-06-06 ENCOUNTER — Encounter: Payer: Self-pay | Admitting: Physical Medicine & Rehabilitation

## 2018-06-06 ENCOUNTER — Other Ambulatory Visit: Payer: Self-pay

## 2018-06-06 ENCOUNTER — Ambulatory Visit: Payer: Self-pay | Admitting: Physical Therapy

## 2018-06-06 ENCOUNTER — Encounter: Payer: Medicaid Other | Attending: Physical Medicine & Rehabilitation | Admitting: Physical Medicine & Rehabilitation

## 2018-06-06 VITALS — BP 133/74 | HR 104 | Wt 200.0 lb

## 2018-06-06 DIAGNOSIS — G8220 Paraplegia, unspecified: Secondary | ICD-10-CM | POA: Insufficient documentation

## 2018-06-06 DIAGNOSIS — Z87820 Personal history of traumatic brain injury: Secondary | ICD-10-CM | POA: Insufficient documentation

## 2018-06-06 DIAGNOSIS — K592 Neurogenic bowel, not elsewhere classified: Secondary | ICD-10-CM | POA: Insufficient documentation

## 2018-06-06 DIAGNOSIS — S24109S Unspecified injury at unspecified level of thoracic spinal cord, sequela: Secondary | ICD-10-CM | POA: Diagnosis not present

## 2018-06-06 DIAGNOSIS — N319 Neuromuscular dysfunction of bladder, unspecified: Secondary | ICD-10-CM | POA: Diagnosis not present

## 2018-06-06 DIAGNOSIS — Z9889 Other specified postprocedural states: Secondary | ICD-10-CM | POA: Insufficient documentation
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
## 2018-06-06 NOTE — Patient Instructions (Signed)
PLEASE FEEL FREE TO CALL OUR OFFICE WITH ANY PROBLEMS OR QUESTIONS ([phone])      

## 2018-06-13 ENCOUNTER — Ambulatory Visit: Payer: Medicaid Other | Admitting: Physical Therapy

## 2018-06-13 ENCOUNTER — Telehealth: Payer: Self-pay

## 2018-06-13 NOTE — Telephone Encounter (Signed)
Colin Mulders from [facility] Radiology called stating patient is scheduled for Renal U/S with diagnosis Neurogenic bladder but per Ultrasound tech needs to know if just looking at the bladder, and if so they can schedule Pelvis limited U/S vs looking at the renal unless looking at the kidneys as well then can leave as is. What to do?

## 2018-06-13 NOTE — Telephone Encounter (Signed)
Pt has neurogenic bladder. Want to look at kidneys to r/o hydronephrosis. It's a standard of care for spinal cord patients with neurogenic bladder

## 2018-06-14 ENCOUNTER — Ambulatory Visit
Admission: RE | Admit: 2018-06-14 | Discharge: 2018-06-14 | Disposition: A | Discharge status: Home / Self Care | Payer: Medicaid Other | Source: Ambulatory Visit | Attending: Physical Medicine & Rehabilitation | Admitting: Physical Medicine & Rehabilitation

## 2018-06-14 DIAGNOSIS — N319 Neuromuscular dysfunction of bladder, unspecified: Secondary | ICD-10-CM | POA: Diagnosis not present

## 2018-06-14 NOTE — Telephone Encounter (Signed)
Luis [name] has been notified.

## 2018-06-15 NOTE — Telephone Encounter (Signed)
Let [facility] know that ultrasound was within normal limits. thx

## 2018-06-15 NOTE — Telephone Encounter (Signed)
Notified patient,he verbalized understanding. 

## 2018-06-27 DIAGNOSIS — R339 Retention of urine, unspecified: Secondary | ICD-10-CM | POA: Diagnosis not present

## 2018-07-27 DIAGNOSIS — R339 Retention of urine, unspecified: Secondary | ICD-10-CM | POA: Diagnosis not present

## 2018-08-06 ENCOUNTER — Ambulatory Visit: Payer: Self-pay | Admitting: Physical Medicine & Rehabilitation

## 2018-08-16 ENCOUNTER — Other Ambulatory Visit: Payer: Self-pay | Admitting: Physical Medicine & Rehabilitation

## 2018-08-16 DIAGNOSIS — N319 Neuromuscular dysfunction of bladder, unspecified: Secondary | ICD-10-CM

## 2018-08-27 DIAGNOSIS — R339 Retention of urine, unspecified: Secondary | ICD-10-CM | POA: Diagnosis not present

## 2018-08-28 IMAGING — CR DG CHEST 1V PORT
1 series · 1 of 1 positions shown · non-contrast
Comparison: 03/16/2016

CLINICAL DATA: Follow-up chest tubes

EXAM:
PORTABLE CHEST 1 VIEW

[AP]
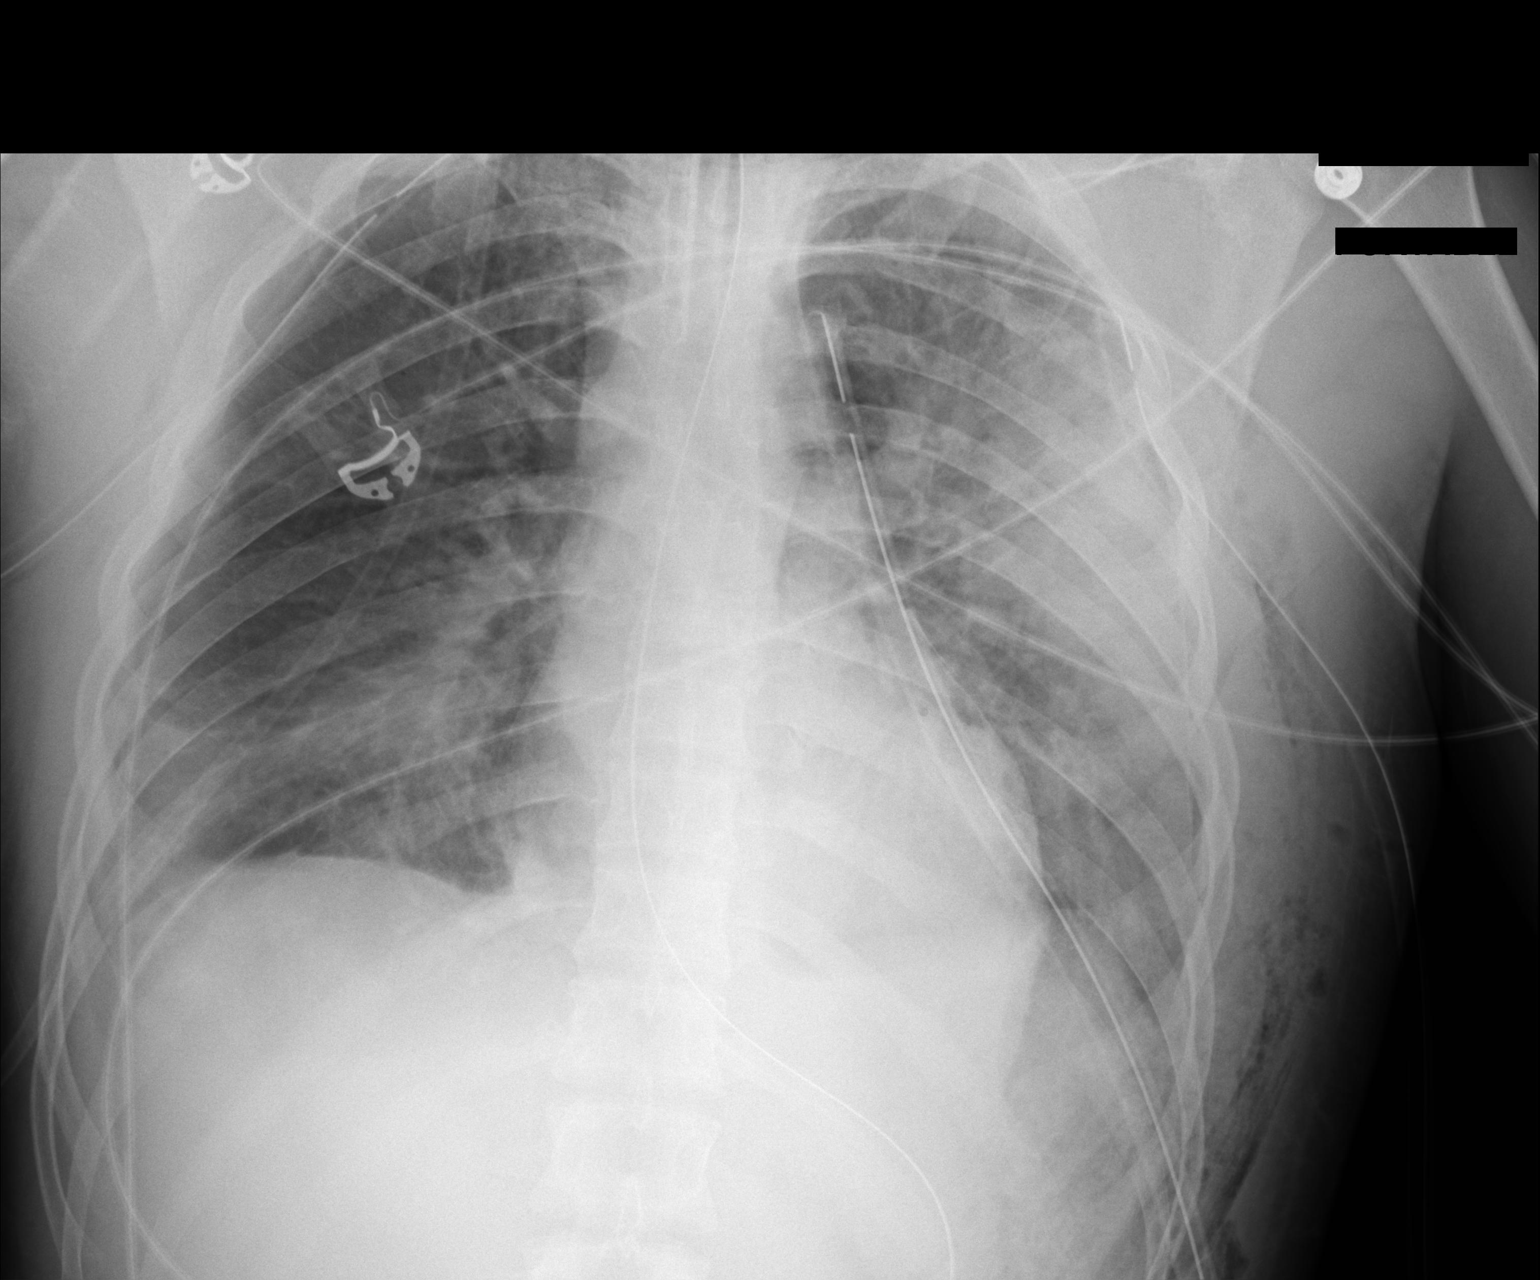

[1 of 1 positions shown; findings below may reference images not displayed]

FINDINGS: Cardiomediastinal silhouette is stable. Endotracheal tube and NG
tube is unchanged in position. Tiny left apical pneumothorax is
stable. Bilateral chest tubes are unchanged in position.
Subcutaneous emphysema left chest wall again noted. Persistent
airspace is in left upper lung.
IMPRESSION: Endotracheal tube and NG tube is unchanged in position. Tiny left
apical pneumothorax is stable. Bilateral chest tubes are unchanged
in position. Subcutaneous emphysema left chest wall again noted.
Persistent airspace is in left upper lung.
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
## 2018-08-28 IMAGING — CR DG CHEST 1V PORT
2 series · 2 of 2 positions shown · non-contrast
Comparison: 03/15/2016

CLINICAL DATA: Left chest tube placement

EXAM:
PORTABLE CHEST 1 VIEW

[AP (1 of 2)]
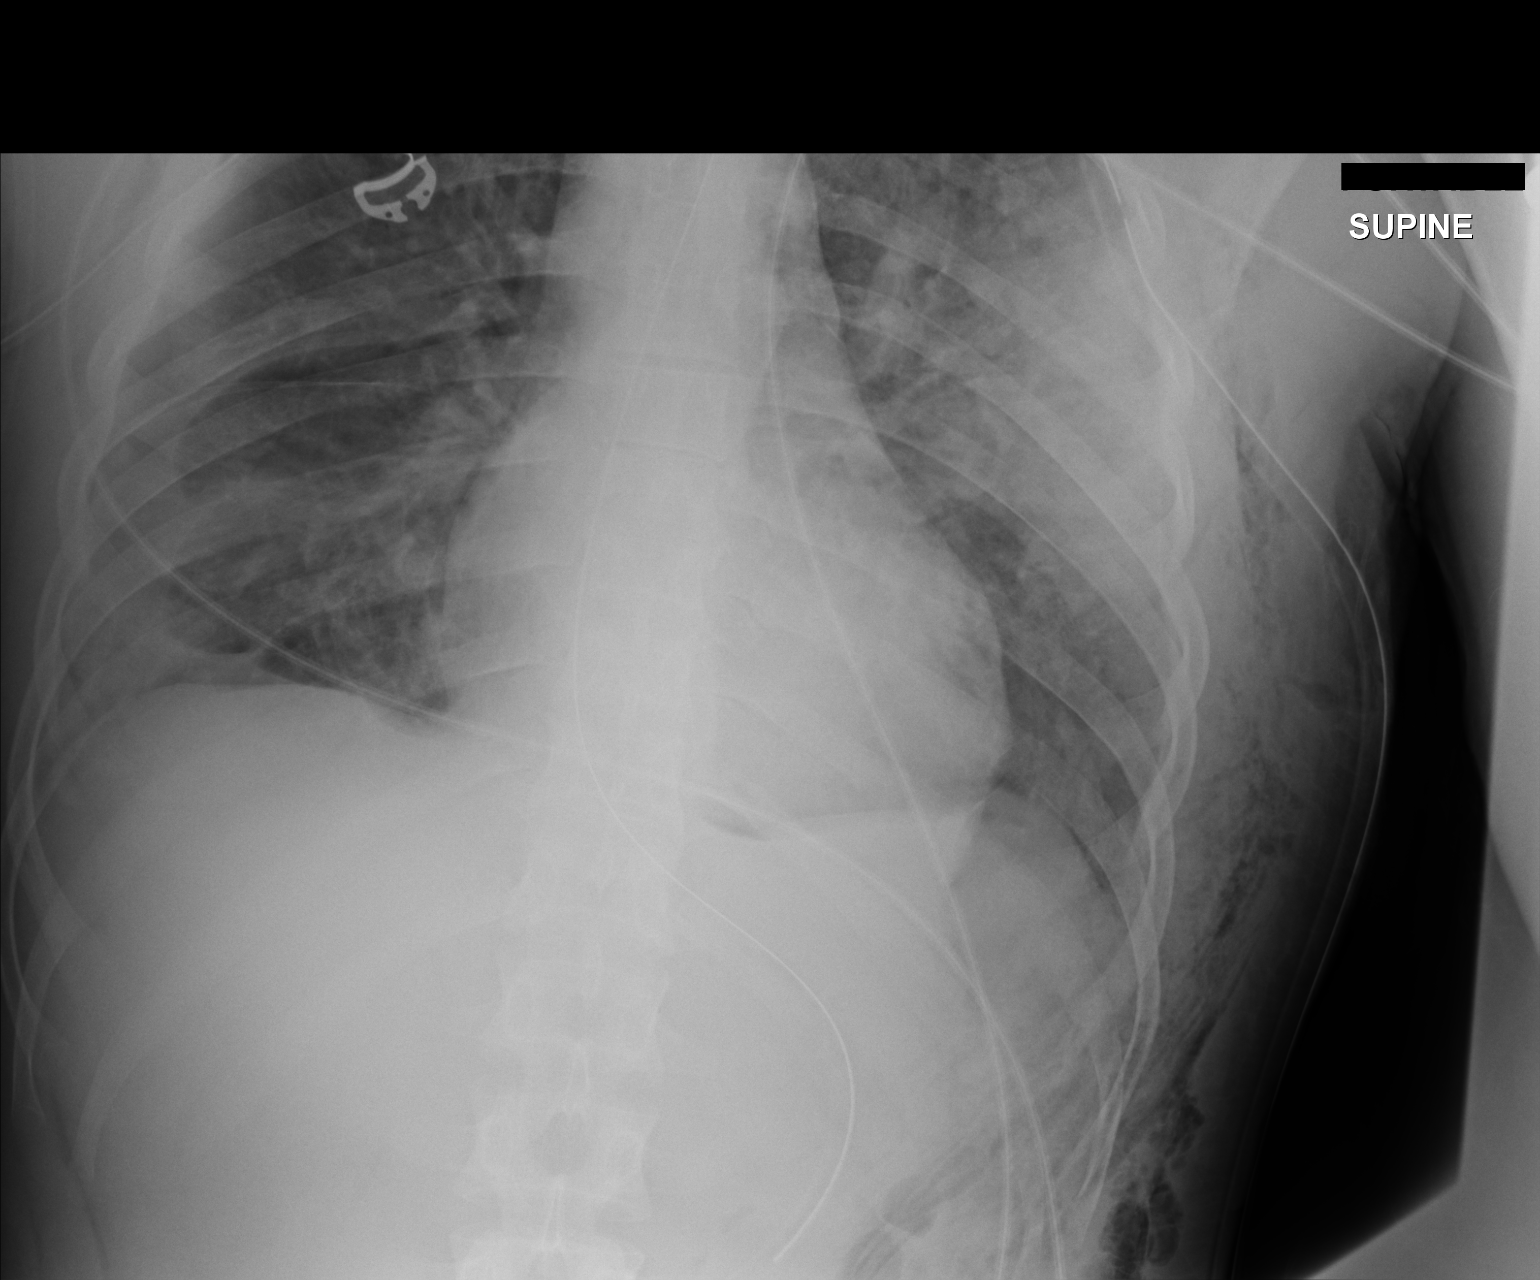

[AP (2 of 2)]
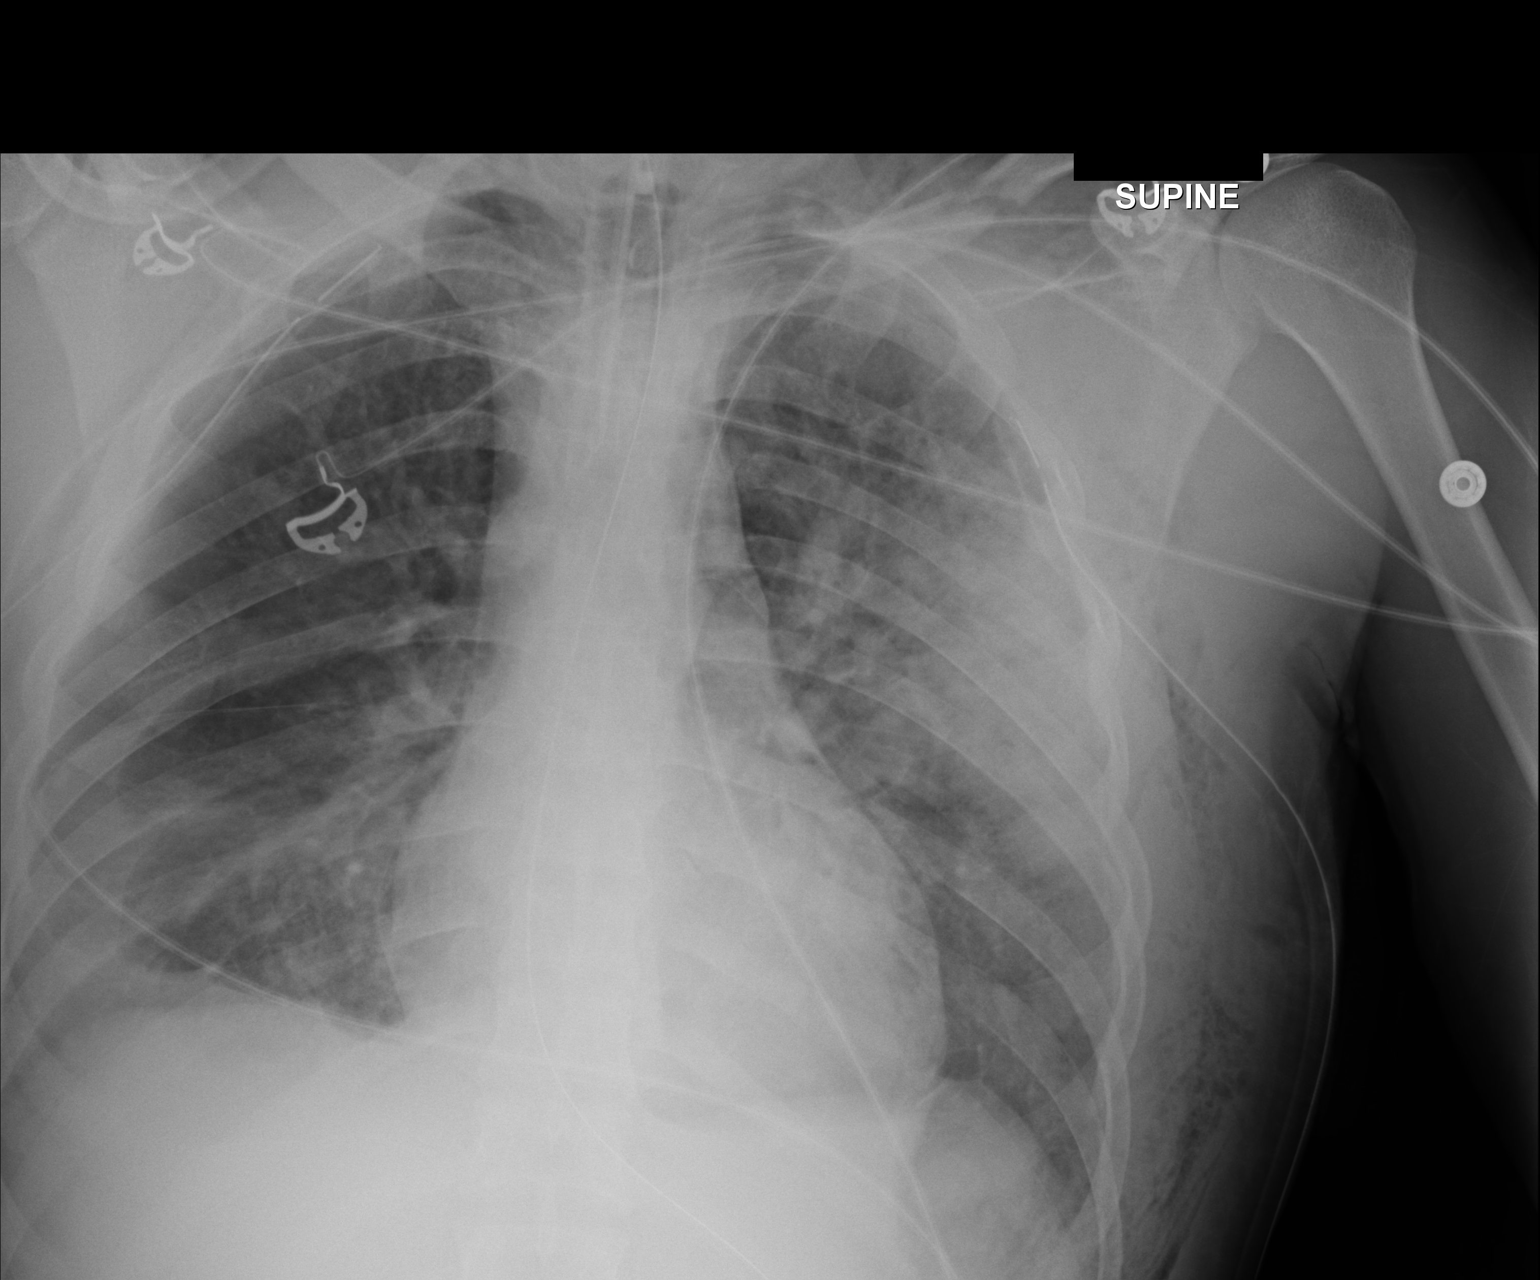

[2 of 2 positions shown; findings below may reference images not displayed]

FINDINGS: Right and left upper chest tubes are stable. Endotracheal and NG
tubes are stable. Tiny medial right basilar pneumothorax. Tiny left
apical pneumothorax. Subcutaneous emphysema over the left chest wall
and in the supraclavicular regions, is unchanged. Airspace disease
within the left upper lung zone laterally and right lung base has
improved on the left but increased on the right. Right pleural
effusion is present which has also increased.
IMPRESSION: Tiny residual left apical pneumothorax.

Tiny residual medial right basilar pneumothorax.

Left airspace disease improved.

Right basilar airspace disease and right pleural effusion increased.

## 2018-08-30 DIAGNOSIS — R262 Difficulty in walking, not elsewhere classified: Secondary | ICD-10-CM | POA: Diagnosis not present

## 2018-09-05 ENCOUNTER — Ambulatory Visit: Payer: Medicaid Other | Admitting: Physical Therapy

## 2018-09-14 ENCOUNTER — Encounter: Payer: Self-pay | Admitting: Physical Therapy

## 2018-09-14 ENCOUNTER — Other Ambulatory Visit: Payer: Self-pay

## 2018-09-14 ENCOUNTER — Ambulatory Visit: Payer: Medicaid Other | Attending: Physical Medicine & Rehabilitation | Admitting: Physical Therapy

## 2018-09-14 DIAGNOSIS — G8221 Paraplegia, complete: Secondary | ICD-10-CM | POA: Insufficient documentation

## 2018-09-14 DIAGNOSIS — R29818 Other symptoms and signs involving the nervous system: Secondary | ICD-10-CM | POA: Diagnosis not present

## 2018-09-14 DIAGNOSIS — R2689 Other abnormalities of gait and mobility: Secondary | ICD-10-CM | POA: Insufficient documentation

## 2018-09-14 DIAGNOSIS — R293 Abnormal posture: Secondary | ICD-10-CM | POA: Diagnosis not present

## 2018-09-14 DIAGNOSIS — M6281 Muscle weakness (generalized): Secondary | ICD-10-CM | POA: Diagnosis not present
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
## 2018-09-19 ENCOUNTER — Other Ambulatory Visit: Payer: Self-pay

## 2018-09-19 ENCOUNTER — Ambulatory Visit: Payer: Medicaid Other | Admitting: Physical Therapy

## 2018-09-19 DIAGNOSIS — R29818 Other symptoms and signs involving the nervous system: Secondary | ICD-10-CM

## 2018-09-19 DIAGNOSIS — G8221 Paraplegia, complete: Secondary | ICD-10-CM

## 2018-09-19 DIAGNOSIS — R2689 Other abnormalities of gait and mobility: Secondary | ICD-10-CM | POA: Diagnosis not present

## 2018-09-19 DIAGNOSIS — M6281 Muscle weakness (generalized): Secondary | ICD-10-CM | POA: Diagnosis not present

## 2018-09-19 DIAGNOSIS — R293 Abnormal posture: Secondary | ICD-10-CM
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
## 2018-09-24 ENCOUNTER — Encounter: Payer: Self-pay | Admitting: Physical Therapy

## 2018-09-24 ENCOUNTER — Ambulatory Visit: Payer: Medicaid Other | Admitting: Physical Therapy

## 2018-09-24 DIAGNOSIS — M6281 Muscle weakness (generalized): Secondary | ICD-10-CM

## 2018-09-24 DIAGNOSIS — R29818 Other symptoms and signs involving the nervous system: Secondary | ICD-10-CM | POA: Diagnosis not present

## 2018-09-24 DIAGNOSIS — G8221 Paraplegia, complete: Secondary | ICD-10-CM | POA: Diagnosis not present

## 2018-09-24 DIAGNOSIS — R2689 Other abnormalities of gait and mobility: Secondary | ICD-10-CM

## 2018-09-24 DIAGNOSIS — R293 Abnormal posture: Secondary | ICD-10-CM | POA: Diagnosis not present
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
## 2018-09-26 DIAGNOSIS — R339 Retention of urine, unspecified: Secondary | ICD-10-CM | POA: Diagnosis not present

## 2018-10-03 ENCOUNTER — Other Ambulatory Visit: Payer: Self-pay

## 2018-10-03 ENCOUNTER — Ambulatory Visit: Payer: Medicaid Other | Attending: Physical Medicine & Rehabilitation | Admitting: Physical Therapy

## 2018-10-03 ENCOUNTER — Encounter: Payer: Self-pay | Admitting: Physical Therapy

## 2018-10-03 DIAGNOSIS — M6281 Muscle weakness (generalized): Secondary | ICD-10-CM | POA: Diagnosis present

## 2018-10-03 DIAGNOSIS — G8221 Paraplegia, complete: Secondary | ICD-10-CM | POA: Diagnosis present

## 2018-10-03 DIAGNOSIS — R293 Abnormal posture: Secondary | ICD-10-CM | POA: Diagnosis not present

## 2018-10-03 DIAGNOSIS — R29818 Other symptoms and signs involving the nervous system: Secondary | ICD-10-CM | POA: Diagnosis present

## 2018-10-03 DIAGNOSIS — R2689 Other abnormalities of gait and mobility: Secondary | ICD-10-CM

## 2018-10-03 NOTE — Patient Instructions (Signed)
Back Extensions    Sit on the edge of your chair with your arms supported and black theraband around your mid back (you should feel it pulling your forwards).  Sit as tall as possible and lean back slightly using back extensors (don't push through your arms). Hold __5_ seconds and return to middle slowly. Repeat _10__ times with arms supported straight back.  Also perform diagonal push back alternating sides x 10 reps.  Progress to performing without using arms for support.
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
## 2018-10-10 ENCOUNTER — Ambulatory Visit: Payer: Medicaid Other | Admitting: Physical Therapy

## 2018-10-10 ENCOUNTER — Other Ambulatory Visit: Payer: Self-pay

## 2018-10-10 DIAGNOSIS — R293 Abnormal posture: Secondary | ICD-10-CM | POA: Diagnosis not present

## 2018-10-10 DIAGNOSIS — R29818 Other symptoms and signs involving the nervous system: Secondary | ICD-10-CM

## 2018-10-10 DIAGNOSIS — G8221 Paraplegia, complete: Secondary | ICD-10-CM

## 2018-10-10 DIAGNOSIS — R2689 Other abnormalities of gait and mobility: Secondary | ICD-10-CM

## 2018-10-10 DIAGNOSIS — M6281 Muscle weakness (generalized): Secondary | ICD-10-CM

## 2018-10-10 NOTE — Addendum Note (Signed)
Addended by: Rico Junker on: 10/10/2018 04:47 PM   Modules accepted: Orders
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
## 2018-10-16 ENCOUNTER — Other Ambulatory Visit: Payer: Self-pay

## 2018-10-16 ENCOUNTER — Ambulatory Visit: Payer: Medicaid Other | Admitting: Physical Therapy

## 2018-10-16 ENCOUNTER — Encounter: Payer: Self-pay | Admitting: Physical Therapy

## 2018-10-16 DIAGNOSIS — R293 Abnormal posture: Secondary | ICD-10-CM

## 2018-10-16 DIAGNOSIS — M6281 Muscle weakness (generalized): Secondary | ICD-10-CM
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
## 2018-10-25 ENCOUNTER — Encounter: Payer: Self-pay | Admitting: Physical Therapy

## 2018-10-25 ENCOUNTER — Ambulatory Visit: Payer: Medicaid Other | Admitting: Physical Therapy

## 2018-10-25 ENCOUNTER — Other Ambulatory Visit: Payer: Self-pay

## 2018-10-25 DIAGNOSIS — R293 Abnormal posture: Secondary | ICD-10-CM | POA: Diagnosis not present

## 2018-10-25 DIAGNOSIS — R29818 Other symptoms and signs involving the nervous system: Secondary | ICD-10-CM

## 2018-10-25 DIAGNOSIS — M6281 Muscle weakness (generalized): Secondary | ICD-10-CM

## 2018-10-25 DIAGNOSIS — R2689 Other abnormalities of gait and mobility: Secondary | ICD-10-CM

## 2018-10-25 DIAGNOSIS — G8221 Paraplegia, complete: Secondary | ICD-10-CM
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
## 2018-10-27 DIAGNOSIS — R339 Retention of urine, unspecified: Secondary | ICD-10-CM | POA: Diagnosis not present

## 2018-10-31 ENCOUNTER — Other Ambulatory Visit: Payer: Self-pay

## 2018-10-31 ENCOUNTER — Ambulatory Visit: Payer: Medicaid Other | Attending: Physical Medicine & Rehabilitation | Admitting: Physical Therapy

## 2018-10-31 DIAGNOSIS — M6281 Muscle weakness (generalized): Secondary | ICD-10-CM

## 2018-10-31 DIAGNOSIS — R293 Abnormal posture: Secondary | ICD-10-CM | POA: Diagnosis not present

## 2018-10-31 DIAGNOSIS — R2689 Other abnormalities of gait and mobility: Secondary | ICD-10-CM | POA: Diagnosis not present

## 2018-10-31 DIAGNOSIS — R29818 Other symptoms and signs involving the nervous system: Secondary | ICD-10-CM

## 2018-10-31 DIAGNOSIS — G8221 Paraplegia, complete: Secondary | ICD-10-CM | POA: Diagnosis not present
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
## 2018-11-08 ENCOUNTER — Encounter: Payer: Self-pay | Admitting: Physical Therapy

## 2018-11-08 ENCOUNTER — Ambulatory Visit: Payer: Medicaid Other | Admitting: Physical Therapy

## 2018-11-08 ENCOUNTER — Other Ambulatory Visit: Payer: Self-pay

## 2018-11-08 DIAGNOSIS — R2689 Other abnormalities of gait and mobility: Secondary | ICD-10-CM

## 2018-11-08 DIAGNOSIS — R29818 Other symptoms and signs involving the nervous system: Secondary | ICD-10-CM

## 2018-11-08 DIAGNOSIS — R293 Abnormal posture: Secondary | ICD-10-CM | POA: Diagnosis not present

## 2018-11-08 DIAGNOSIS — G8221 Paraplegia, complete: Secondary | ICD-10-CM | POA: Diagnosis not present

## 2018-11-08 DIAGNOSIS — M6281 Muscle weakness (generalized): Secondary | ICD-10-CM
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
## 2018-11-14 ENCOUNTER — Encounter: Payer: Self-pay | Admitting: Physical Therapy

## 2018-11-14 ENCOUNTER — Other Ambulatory Visit: Payer: Self-pay

## 2018-11-14 ENCOUNTER — Ambulatory Visit: Payer: Medicaid Other | Admitting: Physical Therapy

## 2018-11-14 DIAGNOSIS — G8221 Paraplegia, complete: Secondary | ICD-10-CM

## 2018-11-14 DIAGNOSIS — R29818 Other symptoms and signs involving the nervous system: Secondary | ICD-10-CM

## 2018-11-14 DIAGNOSIS — R2689 Other abnormalities of gait and mobility: Secondary | ICD-10-CM

## 2018-11-14 DIAGNOSIS — R293 Abnormal posture: Secondary | ICD-10-CM

## 2018-11-14 DIAGNOSIS — M6281 Muscle weakness (generalized): Secondary | ICD-10-CM | POA: Diagnosis not present
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
## 2018-11-21 ENCOUNTER — Other Ambulatory Visit: Payer: Self-pay

## 2018-11-21 ENCOUNTER — Encounter: Payer: Self-pay | Admitting: Physical Therapy

## 2018-11-21 ENCOUNTER — Ambulatory Visit: Payer: Medicaid Other | Admitting: Physical Therapy

## 2018-11-21 DIAGNOSIS — R293 Abnormal posture: Secondary | ICD-10-CM

## 2018-11-21 DIAGNOSIS — R29818 Other symptoms and signs involving the nervous system: Secondary | ICD-10-CM | POA: Diagnosis not present

## 2018-11-21 DIAGNOSIS — M6281 Muscle weakness (generalized): Secondary | ICD-10-CM | POA: Diagnosis not present

## 2018-11-21 DIAGNOSIS — G8221 Paraplegia, complete: Secondary | ICD-10-CM

## 2018-11-21 DIAGNOSIS — R2689 Other abnormalities of gait and mobility: Secondary | ICD-10-CM | POA: Diagnosis not present
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
## 2018-11-28 ENCOUNTER — Ambulatory Visit: Payer: Medicaid Other | Admitting: Physical Therapy

## 2018-11-28 DIAGNOSIS — R339 Retention of urine, unspecified: Secondary | ICD-10-CM | POA: Diagnosis not present

## 2018-12-06 ENCOUNTER — Encounter: Payer: Self-pay | Admitting: Physical Therapy

## 2018-12-06 ENCOUNTER — Ambulatory Visit: Payer: Medicaid Other | Attending: Physical Medicine & Rehabilitation | Admitting: Physical Therapy

## 2018-12-06 ENCOUNTER — Other Ambulatory Visit: Payer: Self-pay

## 2018-12-06 DIAGNOSIS — G8221 Paraplegia, complete: Secondary | ICD-10-CM

## 2018-12-06 DIAGNOSIS — R29898 Other symptoms and signs involving the musculoskeletal system: Secondary | ICD-10-CM | POA: Diagnosis not present

## 2018-12-06 DIAGNOSIS — M6281 Muscle weakness (generalized): Secondary | ICD-10-CM | POA: Insufficient documentation

## 2018-12-06 DIAGNOSIS — R2689 Other abnormalities of gait and mobility: Secondary | ICD-10-CM | POA: Diagnosis not present

## 2018-12-06 DIAGNOSIS — R29818 Other symptoms and signs involving the nervous system: Secondary | ICD-10-CM

## 2018-12-06 DIAGNOSIS — R293 Abnormal posture: Secondary | ICD-10-CM | POA: Diagnosis not present
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
## 2018-12-08 NOTE — Addendum Note (Signed)
Addended by: Rico Junker on: 12/08/2018 02:38 PM   Modules accepted: Orders

## 2018-12-12 ENCOUNTER — Encounter: Payer: Medicaid Other | Attending: Physical Medicine & Rehabilitation | Admitting: Physical Medicine & Rehabilitation

## 2018-12-12 ENCOUNTER — Encounter: Payer: Self-pay | Admitting: Physical Medicine & Rehabilitation

## 2018-12-12 ENCOUNTER — Other Ambulatory Visit: Payer: Self-pay

## 2018-12-12 VITALS — BP 124/81 | HR 88 | Temp 99.6°F | Ht 70.0 in | Wt 200.0 lb

## 2018-12-12 DIAGNOSIS — S24109S Unspecified injury at unspecified level of thoracic spinal cord, sequela: Secondary | ICD-10-CM

## 2018-12-12 NOTE — Patient Instructions (Signed)
PLEASE FEEL FREE TO CALL OUR OFFICE WITH ANY PROBLEMS OR QUESTIONS ([phone])      
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
## 2018-12-25 ENCOUNTER — Ambulatory Visit: Payer: Medicaid Other | Admitting: Physical Therapy

## 2018-12-25 ENCOUNTER — Encounter: Payer: Self-pay | Admitting: Physical Therapy

## 2018-12-25 ENCOUNTER — Other Ambulatory Visit: Payer: Self-pay

## 2018-12-25 DIAGNOSIS — R29898 Other symptoms and signs involving the musculoskeletal system: Secondary | ICD-10-CM

## 2018-12-25 DIAGNOSIS — R2689 Other abnormalities of gait and mobility: Secondary | ICD-10-CM

## 2018-12-25 DIAGNOSIS — M6281 Muscle weakness (generalized): Secondary | ICD-10-CM | POA: Diagnosis not present

## 2018-12-25 DIAGNOSIS — R29818 Other symptoms and signs involving the nervous system: Secondary | ICD-10-CM

## 2018-12-25 DIAGNOSIS — G8221 Paraplegia, complete: Secondary | ICD-10-CM

## 2018-12-25 DIAGNOSIS — R293 Abnormal posture: Secondary | ICD-10-CM | POA: Diagnosis not present
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
## 2018-12-27 DIAGNOSIS — R339 Retention of urine, unspecified: Secondary | ICD-10-CM | POA: Diagnosis not present

## 2018-12-31 ENCOUNTER — Encounter: Payer: Self-pay | Admitting: Physical Therapy

## 2018-12-31 ENCOUNTER — Other Ambulatory Visit: Payer: Self-pay

## 2018-12-31 ENCOUNTER — Ambulatory Visit: Payer: Medicaid Other | Attending: Physical Medicine & Rehabilitation | Admitting: Physical Therapy

## 2018-12-31 DIAGNOSIS — R293 Abnormal posture: Secondary | ICD-10-CM | POA: Diagnosis not present

## 2018-12-31 DIAGNOSIS — M6281 Muscle weakness (generalized): Secondary | ICD-10-CM | POA: Insufficient documentation

## 2018-12-31 DIAGNOSIS — G8221 Paraplegia, complete: Secondary | ICD-10-CM | POA: Diagnosis not present

## 2018-12-31 DIAGNOSIS — R29818 Other symptoms and signs involving the nervous system: Secondary | ICD-10-CM | POA: Insufficient documentation

## 2018-12-31 DIAGNOSIS — R2689 Other abnormalities of gait and mobility: Secondary | ICD-10-CM | POA: Insufficient documentation

## 2019-01-01 ENCOUNTER — Telehealth: Payer: Self-pay | Admitting: Physical Therapy

## 2019-01-01 DIAGNOSIS — G8220 Paraplegia, unspecified: Secondary | ICD-10-CM

## 2019-01-01 NOTE — Telephone Encounter (Signed)
Hello Dr. Naaman Plummer, Luis [name] has progressed to where he can start ambulating at home with his dad's assistance.  He currently has a regular RW but is going to need a wide walker to fit him and the RGO inside.    If you agree, will you please enter a DME order into Epic for a wide, heavy duty two wheeled rolling walker?  Thanks, Luis [name], PT, DPT 01/01/19    12:19 PM
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
## 2019-01-07 NOTE — Telephone Encounter (Signed)
Thanks Audra. Order for walker entered  ZTS

## 2019-01-09 ENCOUNTER — Ambulatory Visit: Payer: Medicaid Other | Admitting: Physical Therapy

## 2019-01-09 ENCOUNTER — Other Ambulatory Visit: Payer: Self-pay

## 2019-01-09 DIAGNOSIS — R29818 Other symptoms and signs involving the nervous system: Secondary | ICD-10-CM | POA: Diagnosis not present

## 2019-01-09 DIAGNOSIS — R2689 Other abnormalities of gait and mobility: Secondary | ICD-10-CM | POA: Diagnosis not present

## 2019-01-09 DIAGNOSIS — R293 Abnormal posture: Secondary | ICD-10-CM

## 2019-01-09 DIAGNOSIS — G8221 Paraplegia, complete: Secondary | ICD-10-CM

## 2019-01-09 DIAGNOSIS — M6281 Muscle weakness (generalized): Secondary | ICD-10-CM
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
## 2019-01-16 ENCOUNTER — Ambulatory Visit: Payer: Medicaid Other | Admitting: Physical Therapy

## 2019-01-16 ENCOUNTER — Other Ambulatory Visit: Payer: Self-pay

## 2019-01-16 DIAGNOSIS — M6281 Muscle weakness (generalized): Secondary | ICD-10-CM

## 2019-01-16 DIAGNOSIS — R2689 Other abnormalities of gait and mobility: Secondary | ICD-10-CM | POA: Diagnosis not present

## 2019-01-16 DIAGNOSIS — R29818 Other symptoms and signs involving the nervous system: Secondary | ICD-10-CM | POA: Diagnosis not present

## 2019-01-16 DIAGNOSIS — R293 Abnormal posture: Secondary | ICD-10-CM | POA: Diagnosis not present

## 2019-01-16 DIAGNOSIS — G8221 Paraplegia, complete: Secondary | ICD-10-CM
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
## 2019-01-18 DIAGNOSIS — S24109A Unspecified injury at unspecified level of thoracic spinal cord, initial encounter: Secondary | ICD-10-CM | POA: Diagnosis not present

## 2019-01-18 DIAGNOSIS — R269 Unspecified abnormalities of gait and mobility: Secondary | ICD-10-CM | POA: Diagnosis not present

## 2019-01-23 ENCOUNTER — Ambulatory Visit: Payer: Medicaid Other | Admitting: Physical Therapy

## 2019-01-23 ENCOUNTER — Other Ambulatory Visit: Payer: Self-pay

## 2019-01-23 ENCOUNTER — Encounter: Payer: Self-pay | Admitting: Physical Therapy

## 2019-01-23 DIAGNOSIS — M6281 Muscle weakness (generalized): Secondary | ICD-10-CM

## 2019-01-23 DIAGNOSIS — G8221 Paraplegia, complete: Secondary | ICD-10-CM | POA: Diagnosis not present

## 2019-01-23 DIAGNOSIS — R2689 Other abnormalities of gait and mobility: Secondary | ICD-10-CM | POA: Diagnosis not present

## 2019-01-23 DIAGNOSIS — R29818 Other symptoms and signs involving the nervous system: Secondary | ICD-10-CM

## 2019-01-23 DIAGNOSIS — R293 Abnormal posture: Secondary | ICD-10-CM | POA: Diagnosis not present
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
## 2019-01-28 DIAGNOSIS — R339 Retention of urine, unspecified: Secondary | ICD-10-CM | POA: Diagnosis not present

## 2019-01-30 ENCOUNTER — Ambulatory Visit: Payer: Medicaid Other | Attending: Physical Medicine & Rehabilitation | Admitting: Physical Therapy

## 2019-01-30 ENCOUNTER — Other Ambulatory Visit: Payer: Self-pay

## 2019-01-30 DIAGNOSIS — R29818 Other symptoms and signs involving the nervous system: Secondary | ICD-10-CM

## 2019-01-30 DIAGNOSIS — R293 Abnormal posture: Secondary | ICD-10-CM | POA: Diagnosis not present

## 2019-01-30 DIAGNOSIS — R2681 Unsteadiness on feet: Secondary | ICD-10-CM | POA: Diagnosis not present

## 2019-01-30 DIAGNOSIS — R2689 Other abnormalities of gait and mobility: Secondary | ICD-10-CM | POA: Diagnosis not present

## 2019-01-30 DIAGNOSIS — G8221 Paraplegia, complete: Secondary | ICD-10-CM | POA: Insufficient documentation

## 2019-01-30 DIAGNOSIS — M6281 Muscle weakness (generalized): Secondary | ICD-10-CM | POA: Diagnosis not present
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
## 2019-02-06 ENCOUNTER — Ambulatory Visit: Payer: Medicaid Other | Admitting: Physical Therapy

## 2019-02-11 ENCOUNTER — Other Ambulatory Visit: Payer: Self-pay

## 2019-02-11 ENCOUNTER — Ambulatory Visit: Payer: Medicaid Other | Admitting: Physical Therapy

## 2019-02-11 ENCOUNTER — Encounter: Payer: Self-pay | Admitting: Physical Therapy

## 2019-02-11 DIAGNOSIS — R2681 Unsteadiness on feet: Secondary | ICD-10-CM

## 2019-02-11 DIAGNOSIS — R29818 Other symptoms and signs involving the nervous system: Secondary | ICD-10-CM | POA: Diagnosis not present

## 2019-02-11 DIAGNOSIS — R2689 Other abnormalities of gait and mobility: Secondary | ICD-10-CM

## 2019-02-11 DIAGNOSIS — G8221 Paraplegia, complete: Secondary | ICD-10-CM

## 2019-02-11 DIAGNOSIS — M6281 Muscle weakness (generalized): Secondary | ICD-10-CM | POA: Diagnosis not present

## 2019-02-11 DIAGNOSIS — R293 Abnormal posture: Secondary | ICD-10-CM | POA: Diagnosis not present
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
## 2019-02-13 ENCOUNTER — Ambulatory Visit: Payer: Medicaid Other | Admitting: Physical Therapy

## 2019-02-20 ENCOUNTER — Ambulatory Visit: Payer: Medicaid Other | Admitting: Physical Therapy

## 2019-02-27 ENCOUNTER — Ambulatory Visit: Payer: Medicaid Other | Admitting: Physical Therapy

## 2019-03-01 DIAGNOSIS — R269 Unspecified abnormalities of gait and mobility: Secondary | ICD-10-CM | POA: Diagnosis not present

## 2019-03-01 DIAGNOSIS — S24109A Unspecified injury at unspecified level of thoracic spinal cord, initial encounter: Secondary | ICD-10-CM | POA: Diagnosis not present

## 2019-03-01 DIAGNOSIS — R339 Retention of urine, unspecified: Secondary | ICD-10-CM | POA: Diagnosis not present

## 2019-03-16 ENCOUNTER — Other Ambulatory Visit: Payer: Self-pay | Admitting: Physical Medicine & Rehabilitation

## 2019-03-16 DIAGNOSIS — N319 Neuromuscular dysfunction of bladder, unspecified: Secondary | ICD-10-CM

## 2019-04-04 DIAGNOSIS — R339 Retention of urine, unspecified: Secondary | ICD-10-CM | POA: Diagnosis not present

## 2019-04-17 ENCOUNTER — Encounter: Payer: Medicaid Other | Attending: Physical Medicine & Rehabilitation | Admitting: Physical Medicine & Rehabilitation

## 2019-04-17 ENCOUNTER — Encounter: Payer: Self-pay | Admitting: Physical Medicine & Rehabilitation

## 2019-04-17 ENCOUNTER — Other Ambulatory Visit: Payer: Self-pay

## 2019-04-17 VITALS — BP 123/74 | HR 114 | Temp 97.5°F | Wt 199.4 lb

## 2019-04-17 DIAGNOSIS — G8220 Paraplegia, unspecified: Secondary | ICD-10-CM | POA: Insufficient documentation

## 2019-04-17 DIAGNOSIS — S24109S Unspecified injury at unspecified level of thoracic spinal cord, sequela: Secondary | ICD-10-CM | POA: Diagnosis not present
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
## 2019-04-17 NOTE — Patient Instructions (Addendum)
TRY REDUCING DITROPAN TO NIGHTLY FOR A WEEK AND SEE HOW YOUR VOLUMES ARE.    COVID-19 Vaccine Information can be found at: [email or web address] For questions related to vaccine distribution or appointments, please email [email or web address] or call [phone].

## 2019-04-29 DIAGNOSIS — R339 Retention of urine, unspecified: Secondary | ICD-10-CM | POA: Diagnosis not present

## 2019-05-27 DIAGNOSIS — R339 Retention of urine, unspecified: Secondary | ICD-10-CM | POA: Diagnosis not present

## 2019-06-29 DIAGNOSIS — R339 Retention of urine, unspecified: Secondary | ICD-10-CM | POA: Diagnosis not present

## 2019-07-02 ENCOUNTER — Telehealth: Payer: Self-pay

## 2019-07-02 DIAGNOSIS — G8220 Paraplegia, unspecified: Secondary | ICD-10-CM

## 2019-07-02 DIAGNOSIS — S24109S Unspecified injury at unspecified level of thoracic spinal cord, sequela: Secondary | ICD-10-CM

## 2019-07-02 NOTE — Telephone Encounter (Signed)
Patient called about a cushion for his wheelchair. Called him back to see what he needed Dr. Riley Kill to do and no answer, voicemail full.

## 2019-07-04 NOTE — Telephone Encounter (Signed)
Order put in.

## 2019-07-29 DIAGNOSIS — R339 Retention of urine, unspecified: Secondary | ICD-10-CM | POA: Diagnosis not present

## 2019-08-01 DIAGNOSIS — S24109A Unspecified injury at unspecified level of thoracic spinal cord, initial encounter: Secondary | ICD-10-CM | POA: Diagnosis not present

## 2019-08-01 DIAGNOSIS — R269 Unspecified abnormalities of gait and mobility: Secondary | ICD-10-CM | POA: Diagnosis not present

## 2019-08-27 DIAGNOSIS — R339 Retention of urine, unspecified: Secondary | ICD-10-CM | POA: Diagnosis not present

## 2019-09-09 DIAGNOSIS — N312 Flaccid neuropathic bladder, not elsewhere classified: Secondary | ICD-10-CM | POA: Diagnosis not present

## 2019-09-09 DIAGNOSIS — R3914 Feeling of incomplete bladder emptying: Secondary | ICD-10-CM | POA: Diagnosis not present

## 2019-09-26 DIAGNOSIS — R339 Retention of urine, unspecified: Secondary | ICD-10-CM | POA: Diagnosis not present

## 2019-10-16 ENCOUNTER — Other Ambulatory Visit: Payer: Self-pay

## 2019-10-16 ENCOUNTER — Encounter: Payer: Self-pay | Admitting: Physical Medicine & Rehabilitation

## 2019-10-16 ENCOUNTER — Other Ambulatory Visit: Payer: Self-pay | Admitting: Physical Medicine & Rehabilitation

## 2019-10-16 ENCOUNTER — Encounter: Payer: Medicaid Other | Attending: Physical Medicine & Rehabilitation | Admitting: Physical Medicine & Rehabilitation

## 2019-10-16 VITALS — BP 147/75 | HR 106 | Temp 98.5°F | Wt 199.0 lb

## 2019-10-16 DIAGNOSIS — S24109S Unspecified injury at unspecified level of thoracic spinal cord, sequela: Secondary | ICD-10-CM

## 2019-10-16 DIAGNOSIS — N319 Neuromuscular dysfunction of bladder, unspecified: Secondary | ICD-10-CM

## 2019-10-16 DIAGNOSIS — G8220 Paraplegia, unspecified: Secondary | ICD-10-CM

## 2019-10-16 MED ORDER — OXYBUTYNIN CHLORIDE 5 MG PO TABS: 5.0000 mg | ORAL_TABLET | Freq: Two times a day (BID) | ORAL | 0 refills | Status: DC
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
## 2019-10-16 NOTE — Patient Instructions (Addendum)
PLEASE FEEL FREE TO CALL OUR OFFICE WITH ANY PROBLEMS OR QUESTIONS [phone]  [employer] [phone] [employer] reviews Geologist, engineering in [place], [place] Service options: [place] Address: [address] [place], [place] [address] Departments: [employer]:  Open ? Closes 5:30PM   More hours Health & safety: Mask required  More details Phone: [phone] Appointments: [email or web address]

## 2019-10-27 DIAGNOSIS — R339 Retention of urine, unspecified: Secondary | ICD-10-CM | POA: Diagnosis not present

## 2019-10-27 DIAGNOSIS — S14108D Unspecified injury at C8 level of cervical spinal cord, subsequent encounter: Secondary | ICD-10-CM | POA: Diagnosis not present

## 2019-11-27 DIAGNOSIS — R339 Retention of urine, unspecified: Secondary | ICD-10-CM | POA: Diagnosis not present

## 2019-11-27 DIAGNOSIS — S14108D Unspecified injury at C8 level of cervical spinal cord, subsequent encounter: Secondary | ICD-10-CM | POA: Diagnosis not present

## 2019-12-27 DIAGNOSIS — S14108D Unspecified injury at C8 level of cervical spinal cord, subsequent encounter: Secondary | ICD-10-CM | POA: Diagnosis not present

## 2019-12-27 DIAGNOSIS — R339 Retention of urine, unspecified: Secondary | ICD-10-CM | POA: Diagnosis not present

## 2020-01-27 DIAGNOSIS — S14108D Unspecified injury at C8 level of cervical spinal cord, subsequent encounter: Secondary | ICD-10-CM | POA: Diagnosis not present

## 2020-01-27 DIAGNOSIS — R339 Retention of urine, unspecified: Secondary | ICD-10-CM | POA: Diagnosis not present

## 2020-02-26 DIAGNOSIS — R3914 Feeling of incomplete bladder emptying: Secondary | ICD-10-CM | POA: Diagnosis not present

## 2020-02-26 DIAGNOSIS — N312 Flaccid neuropathic bladder, not elsewhere classified: Secondary | ICD-10-CM | POA: Diagnosis not present

## 2020-03-12 ENCOUNTER — Encounter: Payer: Self-pay | Admitting: Physical Therapy

## 2020-03-12 ENCOUNTER — Ambulatory Visit: Payer: Medicaid Other | Attending: Physical Medicine & Rehabilitation | Admitting: Physical Therapy

## 2020-03-12 ENCOUNTER — Other Ambulatory Visit: Payer: Self-pay

## 2020-03-12 DIAGNOSIS — R293 Abnormal posture: Secondary | ICD-10-CM | POA: Insufficient documentation

## 2020-03-12 DIAGNOSIS — G8221 Paraplegia, complete: Secondary | ICD-10-CM | POA: Diagnosis present

## 2020-03-12 DIAGNOSIS — R29818 Other symptoms and signs involving the nervous system: Secondary | ICD-10-CM | POA: Diagnosis present

## 2020-03-12 DIAGNOSIS — M6281 Muscle weakness (generalized): Secondary | ICD-10-CM | POA: Diagnosis present
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
## 2020-04-05 DIAGNOSIS — N312 Flaccid neuropathic bladder, not elsewhere classified: Secondary | ICD-10-CM | POA: Diagnosis not present

## 2020-04-05 DIAGNOSIS — R3914 Feeling of incomplete bladder emptying: Secondary | ICD-10-CM | POA: Diagnosis not present

## 2020-04-15 ENCOUNTER — Encounter: Payer: Self-pay | Admitting: Physical Medicine & Rehabilitation

## 2020-04-15 ENCOUNTER — Other Ambulatory Visit: Payer: Self-pay

## 2020-04-15 ENCOUNTER — Encounter: Payer: Medicaid Other | Attending: Physical Medicine & Rehabilitation | Admitting: Physical Medicine & Rehabilitation

## 2020-04-15 VITALS — BP 123/80 | HR 109 | Temp 98.3°F | Ht 73.0 in | Wt 185.0 lb

## 2020-04-15 DIAGNOSIS — K592 Neurogenic bowel, not elsewhere classified: Secondary | ICD-10-CM | POA: Insufficient documentation

## 2020-04-15 DIAGNOSIS — S24109S Unspecified injury at unspecified level of thoracic spinal cord, sequela: Secondary | ICD-10-CM | POA: Insufficient documentation

## 2020-04-15 DIAGNOSIS — S069X9S Unspecified intracranial injury with loss of consciousness of unspecified duration, sequela: Secondary | ICD-10-CM | POA: Insufficient documentation

## 2020-04-15 DIAGNOSIS — G8220 Paraplegia, unspecified: Secondary | ICD-10-CM | POA: Diagnosis not present
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
## 2020-04-15 NOTE — Patient Instructions (Signed)
PLEASE FEEL FREE TO CALL OUR OFFICE WITH ANY PROBLEMS OR QUESTIONS ([phone])      

## 2020-05-05 ENCOUNTER — Other Ambulatory Visit: Payer: Self-pay | Admitting: Physical Medicine & Rehabilitation

## 2020-05-05 DIAGNOSIS — N319 Neuromuscular dysfunction of bladder, unspecified: Secondary | ICD-10-CM

## 2020-05-14 DIAGNOSIS — R3914 Feeling of incomplete bladder emptying: Secondary | ICD-10-CM | POA: Diagnosis not present

## 2020-05-14 DIAGNOSIS — N312 Flaccid neuropathic bladder, not elsewhere classified: Secondary | ICD-10-CM | POA: Diagnosis not present

## 2020-06-03 ENCOUNTER — Telehealth: Payer: Self-pay | Admitting: *Deleted

## 2020-06-03 DIAGNOSIS — R3914 Feeling of incomplete bladder emptying: Secondary | ICD-10-CM | POA: Diagnosis not present

## 2020-06-03 DIAGNOSIS — N312 Flaccid neuropathic bladder, not elsewhere classified: Secondary | ICD-10-CM | POA: Diagnosis not present

## 2020-06-03 NOTE — Telephone Encounter (Signed)
Luis [name] says that her reached out to [facility] about making his wheelchair and they will make it but they need Dr Riley Kill to approve the order.

## 2020-06-08 NOTE — Telephone Encounter (Signed)
Ok. I "approve it. "  Let me know what I need to do. thx

## 2020-06-09 NOTE — Telephone Encounter (Signed)
Luis [name] has scheduled wheelchair appt with Riley Kill on 06/17/20.

## 2020-06-17 ENCOUNTER — Other Ambulatory Visit: Payer: Self-pay

## 2020-06-17 ENCOUNTER — Encounter: Payer: Self-pay | Admitting: Physical Medicine & Rehabilitation

## 2020-06-17 ENCOUNTER — Encounter: Payer: Medicaid Other | Attending: Physical Medicine & Rehabilitation | Admitting: Physical Medicine & Rehabilitation

## 2020-06-17 VITALS — BP 130/78 | HR 85 | Temp 98.6°F | Ht 73.0 in | Wt 187.0 lb

## 2020-06-17 DIAGNOSIS — G8220 Paraplegia, unspecified: Secondary | ICD-10-CM
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
## 2020-06-17 NOTE — Patient Instructions (Signed)
[facility], Spinal Cord Injury Support [facility] - [place] Contact: Ladene Artist. Phone: [phone]. Email: [email or web address].

## 2020-06-26 DIAGNOSIS — R3914 Feeling of incomplete bladder emptying: Secondary | ICD-10-CM | POA: Diagnosis not present

## 2020-06-26 DIAGNOSIS — N312 Flaccid neuropathic bladder, not elsewhere classified: Secondary | ICD-10-CM | POA: Diagnosis not present

## 2020-08-26 DIAGNOSIS — R3914 Feeling of incomplete bladder emptying: Secondary | ICD-10-CM | POA: Diagnosis not present

## 2020-08-26 DIAGNOSIS — N312 Flaccid neuropathic bladder, not elsewhere classified: Secondary | ICD-10-CM | POA: Diagnosis not present

## 2020-09-24 DIAGNOSIS — G8220 Paraplegia, unspecified: Secondary | ICD-10-CM | POA: Diagnosis not present

## 2020-09-24 DIAGNOSIS — S069X9A Unspecified intracranial injury with loss of consciousness of unspecified duration, initial encounter: Secondary | ICD-10-CM | POA: Diagnosis not present

## 2020-09-24 DIAGNOSIS — R269 Unspecified abnormalities of gait and mobility: Secondary | ICD-10-CM | POA: Diagnosis not present

## 2020-09-25 DIAGNOSIS — N312 Flaccid neuropathic bladder, not elsewhere classified: Secondary | ICD-10-CM | POA: Diagnosis not present

## 2020-09-25 DIAGNOSIS — R3914 Feeling of incomplete bladder emptying: Secondary | ICD-10-CM | POA: Diagnosis not present

## 2020-10-14 ENCOUNTER — Ambulatory Visit: Payer: Medicaid Other | Admitting: Physical Medicine & Rehabilitation

## 2020-10-26 DIAGNOSIS — N312 Flaccid neuropathic bladder, not elsewhere classified: Secondary | ICD-10-CM | POA: Diagnosis not present

## 2020-10-26 DIAGNOSIS — R3914 Feeling of incomplete bladder emptying: Secondary | ICD-10-CM | POA: Diagnosis not present

## 2020-11-18 ENCOUNTER — Other Ambulatory Visit: Payer: Self-pay

## 2020-11-18 ENCOUNTER — Encounter: Payer: Medicaid Other | Attending: Physical Medicine & Rehabilitation | Admitting: Physical Medicine & Rehabilitation

## 2020-11-18 ENCOUNTER — Encounter: Payer: Self-pay | Admitting: Physical Medicine & Rehabilitation

## 2020-11-18 VITALS — BP 121/72 | HR 88 | Temp 98.0°F

## 2020-11-18 DIAGNOSIS — G8220 Paraplegia, unspecified: Secondary | ICD-10-CM | POA: Insufficient documentation

## 2020-11-18 DIAGNOSIS — S24109S Unspecified injury at unspecified level of thoracic spinal cord, sequela: Secondary | ICD-10-CM | POA: Insufficient documentation

## 2020-11-18 DIAGNOSIS — N319 Neuromuscular dysfunction of bladder, unspecified: Secondary | ICD-10-CM | POA: Insufficient documentation
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
## 2020-11-18 NOTE — Patient Instructions (Signed)
PLEASE FEEL FREE TO CALL OUR OFFICE WITH ANY PROBLEMS OR QUESTIONS ([phone])      

## 2020-11-26 DIAGNOSIS — R3914 Feeling of incomplete bladder emptying: Secondary | ICD-10-CM | POA: Diagnosis not present

## 2020-11-26 DIAGNOSIS — N312 Flaccid neuropathic bladder, not elsewhere classified: Secondary | ICD-10-CM | POA: Diagnosis not present

## 2020-12-07 ENCOUNTER — Other Ambulatory Visit: Payer: Self-pay | Admitting: Physical Medicine & Rehabilitation

## 2020-12-07 DIAGNOSIS — N319 Neuromuscular dysfunction of bladder, unspecified: Secondary | ICD-10-CM

## 2020-12-26 DIAGNOSIS — N312 Flaccid neuropathic bladder, not elsewhere classified: Secondary | ICD-10-CM | POA: Diagnosis not present

## 2020-12-26 DIAGNOSIS — R3914 Feeling of incomplete bladder emptying: Secondary | ICD-10-CM | POA: Diagnosis not present

## 2021-02-25 DIAGNOSIS — R3914 Feeling of incomplete bladder emptying: Secondary | ICD-10-CM | POA: Diagnosis not present

## 2021-02-25 DIAGNOSIS — N312 Flaccid neuropathic bladder, not elsewhere classified: Secondary | ICD-10-CM | POA: Diagnosis not present

## 2021-03-29 DIAGNOSIS — R3914 Feeling of incomplete bladder emptying: Secondary | ICD-10-CM | POA: Diagnosis not present

## 2021-03-29 DIAGNOSIS — N312 Flaccid neuropathic bladder, not elsewhere classified: Secondary | ICD-10-CM | POA: Diagnosis not present

## 2021-04-28 DIAGNOSIS — R3914 Feeling of incomplete bladder emptying: Secondary | ICD-10-CM | POA: Diagnosis not present

## 2021-04-28 DIAGNOSIS — N312 Flaccid neuropathic bladder, not elsewhere classified: Secondary | ICD-10-CM | POA: Diagnosis not present

## 2021-07-26 DIAGNOSIS — N312 Flaccid neuropathic bladder, not elsewhere classified: Secondary | ICD-10-CM | POA: Diagnosis not present

## 2021-07-26 DIAGNOSIS — R3914 Feeling of incomplete bladder emptying: Secondary | ICD-10-CM | POA: Diagnosis not present

## 2021-08-26 DIAGNOSIS — N312 Flaccid neuropathic bladder, not elsewhere classified: Secondary | ICD-10-CM | POA: Diagnosis not present

## 2021-08-26 DIAGNOSIS — R3914 Feeling of incomplete bladder emptying: Secondary | ICD-10-CM | POA: Diagnosis not present

## 2021-09-03 ENCOUNTER — Other Ambulatory Visit: Payer: Self-pay | Admitting: Physical Medicine & Rehabilitation

## 2021-09-03 DIAGNOSIS — N319 Neuromuscular dysfunction of bladder, unspecified: Secondary | ICD-10-CM

## 2021-09-25 DIAGNOSIS — N312 Flaccid neuropathic bladder, not elsewhere classified: Secondary | ICD-10-CM | POA: Diagnosis not present

## 2021-09-25 DIAGNOSIS — R3914 Feeling of incomplete bladder emptying: Secondary | ICD-10-CM | POA: Diagnosis not present

## 2021-10-26 DIAGNOSIS — R3914 Feeling of incomplete bladder emptying: Secondary | ICD-10-CM | POA: Diagnosis not present

## 2021-10-26 DIAGNOSIS — N312 Flaccid neuropathic bladder, not elsewhere classified: Secondary | ICD-10-CM | POA: Diagnosis not present

## 2021-11-17 ENCOUNTER — Encounter: Payer: Self-pay | Admitting: Physical Medicine & Rehabilitation

## 2021-11-17 ENCOUNTER — Telehealth: Payer: Self-pay

## 2021-11-17 ENCOUNTER — Encounter: Payer: Medicaid Other | Attending: Physical Medicine & Rehabilitation | Admitting: Physical Medicine & Rehabilitation

## 2021-11-17 VITALS — BP 129/80 | HR 127 | Ht 73.0 in | Wt 194.6 lb

## 2021-11-17 DIAGNOSIS — S24109S Unspecified injury at unspecified level of thoracic spinal cord, sequela: Secondary | ICD-10-CM | POA: Diagnosis not present

## 2021-11-17 DIAGNOSIS — G8220 Paraplegia, unspecified: Secondary | ICD-10-CM

## 2021-11-17 DIAGNOSIS — K592 Neurogenic bowel, not elsewhere classified: Secondary | ICD-10-CM

## 2021-11-17 DIAGNOSIS — N319 Neuromuscular dysfunction of bladder, unspecified: Secondary | ICD-10-CM
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
## 2021-11-17 NOTE — Patient Instructions (Addendum)
CONSIDER SOME TRAINING OR EDUCATION FOR WORK!  YOU MIGHT LIKE IT!  [facility] [address] [place], [place] [address] Phone [phone] Fax [phone]

## 2021-11-17 NOTE — Telephone Encounter (Signed)
Cindy from [employer] called to say the patient cannot have the US Renal at the [facility] location and he has to have it done at the hospital. Spoke with Dr. Riley Kill to inform him. Arline Asp stated she can have it transferred over to the hospital and the hospital can schedule it for the patient. She put it in the system to have Dr. Salvadore Dom on it.

## 2021-11-26 DIAGNOSIS — R3914 Feeling of incomplete bladder emptying: Secondary | ICD-10-CM | POA: Diagnosis not present

## 2021-11-26 DIAGNOSIS — N312 Flaccid neuropathic bladder, not elsewhere classified: Secondary | ICD-10-CM | POA: Diagnosis not present

## 2021-11-30 ENCOUNTER — Telehealth (HOSPITAL_COMMUNITY): Payer: Self-pay | Admitting: Physical Medicine & Rehabilitation

## 2021-11-30 ENCOUNTER — Ambulatory Visit (HOSPITAL_COMMUNITY)
Admission: RE | Admit: 2021-11-30 | Discharge: 2021-11-30 | Disposition: A | Discharge status: Home / Self Care | Payer: Medicaid Other | Source: Ambulatory Visit | Attending: Physical Medicine & Rehabilitation | Admitting: Physical Medicine & Rehabilitation

## 2021-11-30 DIAGNOSIS — N319 Neuromuscular dysfunction of bladder, unspecified: Secondary | ICD-10-CM | POA: Diagnosis not present

## 2021-11-30 NOTE — Telephone Encounter (Signed)
Please let [name] know that his kidney ultrasound looks normal.  Thanks!

## 2021-12-01 NOTE — Telephone Encounter (Signed)
Notified. 

## 2021-12-26 DIAGNOSIS — R3914 Feeling of incomplete bladder emptying: Secondary | ICD-10-CM | POA: Diagnosis not present

## 2021-12-26 DIAGNOSIS — N312 Flaccid neuropathic bladder, not elsewhere classified: Secondary | ICD-10-CM | POA: Diagnosis not present

## 2022-01-26 DIAGNOSIS — N312 Flaccid neuropathic bladder, not elsewhere classified: Secondary | ICD-10-CM | POA: Diagnosis not present

## 2022-01-26 DIAGNOSIS — R3914 Feeling of incomplete bladder emptying: Secondary | ICD-10-CM | POA: Diagnosis not present

## 2022-02-25 DIAGNOSIS — R3914 Feeling of incomplete bladder emptying: Secondary | ICD-10-CM | POA: Diagnosis not present

## 2022-02-25 DIAGNOSIS — N312 Flaccid neuropathic bladder, not elsewhere classified: Secondary | ICD-10-CM | POA: Diagnosis not present

## 2022-03-28 DIAGNOSIS — N312 Flaccid neuropathic bladder, not elsewhere classified: Secondary | ICD-10-CM | POA: Diagnosis not present

## 2022-03-28 DIAGNOSIS — R3914 Feeling of incomplete bladder emptying: Secondary | ICD-10-CM | POA: Diagnosis not present

## 2022-04-28 DIAGNOSIS — N312 Flaccid neuropathic bladder, not elsewhere classified: Secondary | ICD-10-CM | POA: Diagnosis not present

## 2022-04-28 DIAGNOSIS — R3914 Feeling of incomplete bladder emptying: Secondary | ICD-10-CM | POA: Diagnosis not present

## 2022-05-27 ENCOUNTER — Other Ambulatory Visit: Payer: Self-pay | Admitting: Physical Medicine & Rehabilitation

## 2022-05-27 DIAGNOSIS — N312 Flaccid neuropathic bladder, not elsewhere classified: Secondary | ICD-10-CM | POA: Diagnosis not present

## 2022-05-27 DIAGNOSIS — R3914 Feeling of incomplete bladder emptying: Secondary | ICD-10-CM | POA: Diagnosis not present

## 2022-05-27 DIAGNOSIS — N319 Neuromuscular dysfunction of bladder, unspecified: Secondary | ICD-10-CM

## 2022-06-27 DIAGNOSIS — R3914 Feeling of incomplete bladder emptying: Secondary | ICD-10-CM | POA: Diagnosis not present

## 2022-06-27 DIAGNOSIS — N312 Flaccid neuropathic bladder, not elsewhere classified: Secondary | ICD-10-CM | POA: Diagnosis not present

## 2022-07-27 DIAGNOSIS — R3914 Feeling of incomplete bladder emptying: Secondary | ICD-10-CM | POA: Diagnosis not present

## 2022-07-27 DIAGNOSIS — N312 Flaccid neuropathic bladder, not elsewhere classified: Secondary | ICD-10-CM | POA: Diagnosis not present

## 2022-08-27 DIAGNOSIS — R3914 Feeling of incomplete bladder emptying: Secondary | ICD-10-CM | POA: Diagnosis not present

## 2022-08-27 DIAGNOSIS — N312 Flaccid neuropathic bladder, not elsewhere classified: Secondary | ICD-10-CM | POA: Diagnosis not present

## 2022-09-26 DIAGNOSIS — R3914 Feeling of incomplete bladder emptying: Secondary | ICD-10-CM | POA: Diagnosis not present

## 2022-09-26 DIAGNOSIS — N312 Flaccid neuropathic bladder, not elsewhere classified: Secondary | ICD-10-CM | POA: Diagnosis not present

## 2022-10-27 DIAGNOSIS — N312 Flaccid neuropathic bladder, not elsewhere classified: Secondary | ICD-10-CM | POA: Diagnosis not present

## 2022-10-27 DIAGNOSIS — R3914 Feeling of incomplete bladder emptying: Secondary | ICD-10-CM | POA: Diagnosis not present

## 2022-11-16 ENCOUNTER — Encounter: Payer: Medicaid Other | Attending: Physical Medicine & Rehabilitation | Admitting: Physical Medicine & Rehabilitation

## 2022-11-16 ENCOUNTER — Encounter: Payer: Self-pay | Admitting: Physical Medicine & Rehabilitation

## 2022-11-16 VITALS — HR 91 | Ht 73.0 in | Wt 200.4 lb

## 2022-11-16 DIAGNOSIS — K592 Neurogenic bowel, not elsewhere classified: Secondary | ICD-10-CM | POA: Diagnosis not present

## 2022-11-16 DIAGNOSIS — N319 Neuromuscular dysfunction of bladder, unspecified: Secondary | ICD-10-CM | POA: Diagnosis not present

## 2022-11-16 DIAGNOSIS — S24109S Unspecified injury at unspecified level of thoracic spinal cord, sequela: Secondary | ICD-10-CM | POA: Insufficient documentation

## 2022-11-16 NOTE — Patient Instructions (Addendum)
ALWAYS FEEL FREE TO CALL OUR OFFICE WITH ANY PROBLEMS OR QUESTIONS [phone])  **PLEASE NOTE** ALL MEDICATION REFILL REQUESTS (INCLUDING CONTROLLED SUBSTANCES) NEED TO BE MADE AT LEAST 7 DAYS PRIOR TO REFILL BEING DUE. ANY REFILL REQUESTS INSIDE THAT TIME FRAME MAY RESULT IN DELAYS IN RECEIVING YOUR PRESCRIPTION.     CONSIDER TRYING A CONDOM CATHETER WHEN YOU'RE OUT WITH AN ATTACHED LEG BAG. THIS MIGHT ALLOW YOU TO GO LONGER BETWEEN CATHS.
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
## 2022-11-27 DIAGNOSIS — N312 Flaccid neuropathic bladder, not elsewhere classified: Secondary | ICD-10-CM | POA: Diagnosis not present

## 2022-11-27 DIAGNOSIS — R3914 Feeling of incomplete bladder emptying: Secondary | ICD-10-CM | POA: Diagnosis not present

## 2022-12-06 DIAGNOSIS — N310 Uninhibited neuropathic bladder, not elsewhere classified: Secondary | ICD-10-CM | POA: Diagnosis not present

## 2022-12-06 DIAGNOSIS — N312 Flaccid neuropathic bladder, not elsewhere classified: Secondary | ICD-10-CM | POA: Diagnosis not present

## 2022-12-27 DIAGNOSIS — R3914 Feeling of incomplete bladder emptying: Secondary | ICD-10-CM | POA: Diagnosis not present

## 2022-12-27 DIAGNOSIS — N312 Flaccid neuropathic bladder, not elsewhere classified: Secondary | ICD-10-CM | POA: Diagnosis not present

## 2023-01-27 DIAGNOSIS — N312 Flaccid neuropathic bladder, not elsewhere classified: Secondary | ICD-10-CM | POA: Diagnosis not present

## 2023-01-27 DIAGNOSIS — R3914 Feeling of incomplete bladder emptying: Secondary | ICD-10-CM | POA: Diagnosis not present

## 2023-02-16 ENCOUNTER — Other Ambulatory Visit: Payer: Self-pay | Admitting: Physical Medicine & Rehabilitation

## 2023-02-16 DIAGNOSIS — N319 Neuromuscular dysfunction of bladder, unspecified: Secondary | ICD-10-CM

## 2023-02-26 DIAGNOSIS — R3914 Feeling of incomplete bladder emptying: Secondary | ICD-10-CM | POA: Diagnosis not present

## 2023-02-26 DIAGNOSIS — N312 Flaccid neuropathic bladder, not elsewhere classified: Secondary | ICD-10-CM | POA: Diagnosis not present

## 2023-03-29 DIAGNOSIS — R3914 Feeling of incomplete bladder emptying: Secondary | ICD-10-CM | POA: Diagnosis not present

## 2023-03-29 DIAGNOSIS — N312 Flaccid neuropathic bladder, not elsewhere classified: Secondary | ICD-10-CM | POA: Diagnosis not present

## 2023-04-25 ENCOUNTER — Telehealth: Payer: Self-pay | Admitting: Physical Medicine & Rehabilitation

## 2023-04-25 NOTE — Telephone Encounter (Signed)
Patient would like a letter stating he can not participate in jury duty due to spinal cord injury.

## 2023-04-28 ENCOUNTER — Encounter: Payer: Self-pay | Admitting: Physical Medicine & Rehabilitation

## 2023-04-28 NOTE — Telephone Encounter (Addendum)
Juror # [phone] Jury duty date : May 31, 2023  Luis [name]  stated he does note feel safe to appear in court at this time. He is a Scientist, clinical (histocompatibility and immunogenetics). He feels unsettled about going to court.   My chart link sent to [facility].

## 2023-04-29 DIAGNOSIS — N312 Flaccid neuropathic bladder, not elsewhere classified: Secondary | ICD-10-CM | POA: Diagnosis not present

## 2023-04-29 DIAGNOSIS — R3914 Feeling of incomplete bladder emptying: Secondary | ICD-10-CM | POA: Diagnosis not present

## 2023-05-01 NOTE — Telephone Encounter (Signed)
[name], I wrote you a letter for jury duty asking to excuse you permanently. If you don't receive it in my chart, someone can pick it up at the office.   Take care, Dr. Kathie Rhodes

## 2023-05-01 NOTE — Telephone Encounter (Signed)
Patient check My Chart the letter is there for printing.

## 2023-05-27 DIAGNOSIS — N312 Flaccid neuropathic bladder, not elsewhere classified: Secondary | ICD-10-CM | POA: Diagnosis not present

## 2023-05-27 DIAGNOSIS — R3914 Feeling of incomplete bladder emptying: Secondary | ICD-10-CM | POA: Diagnosis not present

## 2023-06-27 DIAGNOSIS — N312 Flaccid neuropathic bladder, not elsewhere classified: Secondary | ICD-10-CM | POA: Diagnosis not present

## 2023-06-27 DIAGNOSIS — R3914 Feeling of incomplete bladder emptying: Secondary | ICD-10-CM | POA: Diagnosis not present

## 2023-07-27 DIAGNOSIS — N312 Flaccid neuropathic bladder, not elsewhere classified: Secondary | ICD-10-CM | POA: Diagnosis not present

## 2023-07-27 DIAGNOSIS — R3914 Feeling of incomplete bladder emptying: Secondary | ICD-10-CM | POA: Diagnosis not present

## 2023-08-27 DIAGNOSIS — R3914 Feeling of incomplete bladder emptying: Secondary | ICD-10-CM | POA: Diagnosis not present

## 2023-08-27 DIAGNOSIS — N312 Flaccid neuropathic bladder, not elsewhere classified: Secondary | ICD-10-CM | POA: Diagnosis not present

## 2023-09-18 ENCOUNTER — Encounter: Payer: Self-pay | Admitting: Physical Medicine & Rehabilitation

## 2023-09-26 DIAGNOSIS — R3914 Feeling of incomplete bladder emptying: Secondary | ICD-10-CM | POA: Diagnosis not present

## 2023-09-26 DIAGNOSIS — N312 Flaccid neuropathic bladder, not elsewhere classified: Secondary | ICD-10-CM | POA: Diagnosis not present

## 2023-10-27 DIAGNOSIS — R3914 Feeling of incomplete bladder emptying: Secondary | ICD-10-CM | POA: Diagnosis not present

## 2023-10-27 DIAGNOSIS — N312 Flaccid neuropathic bladder, not elsewhere classified: Secondary | ICD-10-CM | POA: Diagnosis not present

## 2023-11-09 ENCOUNTER — Other Ambulatory Visit: Payer: Self-pay | Admitting: Physical Medicine & Rehabilitation

## 2023-11-09 DIAGNOSIS — N319 Neuromuscular dysfunction of bladder, unspecified: Secondary | ICD-10-CM

## 2023-11-13 DIAGNOSIS — G8221 Paraplegia, complete: Secondary | ICD-10-CM | POA: Diagnosis not present

## 2023-11-13 DIAGNOSIS — S06300A Unspecified focal traumatic brain injury without loss of consciousness, initial encounter: Secondary | ICD-10-CM | POA: Diagnosis not present

## 2023-11-15 ENCOUNTER — Encounter: Payer: Self-pay | Admitting: Physical Medicine & Rehabilitation

## 2023-11-15 ENCOUNTER — Encounter: Payer: Medicaid Other | Attending: Physical Medicine & Rehabilitation | Admitting: Physical Medicine & Rehabilitation

## 2023-11-15 VITALS — BP 126/84 | HR 83 | Ht 73.0 in | Wt 198.8 lb

## 2023-11-15 DIAGNOSIS — S24109S Unspecified injury at unspecified level of thoracic spinal cord, sequela: Secondary | ICD-10-CM | POA: Insufficient documentation

## 2023-11-15 DIAGNOSIS — K592 Neurogenic bowel, not elsewhere classified: Secondary | ICD-10-CM | POA: Diagnosis not present

## 2023-11-15 NOTE — Patient Instructions (Signed)
 ALWAYS FEEL FREE TO CALL OUR OFFICE WITH ANY PROBLEMS OR QUESTIONS [phone])  **PLEASE NOTE** ALL MEDICATION REFILL REQUESTS (INCLUDING CONTROLLED SUBSTANCES) NEED TO BE MADE AT LEAST 7 DAYS PRIOR TO REFILL BEING DUE. ANY REFILL REQUESTS INSIDE THAT TIME FRAME MAY RESULT IN DELAYS IN RECEIVING YOUR PRESCRIPTION.
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
## 2023-12-08 DIAGNOSIS — N312 Flaccid neuropathic bladder, not elsewhere classified: Secondary | ICD-10-CM | POA: Diagnosis not present

## 2023-12-15 DIAGNOSIS — N310 Uninhibited neuropathic bladder, not elsewhere classified: Secondary | ICD-10-CM | POA: Diagnosis not present

## 2023-12-15 DIAGNOSIS — N312 Flaccid neuropathic bladder, not elsewhere classified: Secondary | ICD-10-CM | POA: Diagnosis not present

## 2023-12-15 DIAGNOSIS — N139 Obstructive and reflux uropathy, unspecified: Secondary | ICD-10-CM | POA: Diagnosis not present

## 2023-12-25 DIAGNOSIS — S34109A Unspecified injury to unspecified level of lumbar spinal cord, initial encounter: Secondary | ICD-10-CM | POA: Diagnosis not present

## 2024-11-13 ENCOUNTER — Ambulatory Visit: Admitting: Physical Medicine & Rehabilitation
